# Patient Record
Sex: Male | Born: 1980 | Race: White | Hispanic: No | Marital: Married | Smoking: Never smoker
Health system: Southern US, Community
[De-identification: ages and names within clinical notes are randomized; demographics above are authoritative.]

## PROBLEM LIST (undated history)

## (undated) ENCOUNTER — Ambulatory Visit (HOSPITAL_BASED_OUTPATIENT_CLINIC_OR_DEPARTMENT_OTHER): Admission: RE | Payer: No Typology Code available for payment source | Admitting: Neurological Surgery

## (undated) ENCOUNTER — Ambulatory Visit (HOSPITAL_BASED_OUTPATIENT_CLINIC_OR_DEPARTMENT_OTHER): Payer: No Typology Code available for payment source | Admitting: Pain Medicine

## (undated) ENCOUNTER — Ambulatory Visit (HOSPITAL_BASED_OUTPATIENT_CLINIC_OR_DEPARTMENT_OTHER): Payer: Commercial Managed Care - HMO | Admitting: Pain Medicine

## (undated) ENCOUNTER — Encounter (HOSPITAL_BASED_OUTPATIENT_CLINIC_OR_DEPARTMENT_OTHER): Payer: Self-pay

## (undated) ENCOUNTER — Inpatient Hospital Stay (HOSPITAL_COMMUNITY): Payer: Self-pay | Admitting: Neurological Surgery

## (undated) ENCOUNTER — Encounter (HOSPITAL_BASED_OUTPATIENT_CLINIC_OR_DEPARTMENT_OTHER): Admission: RE | Payer: Self-pay

## (undated) ENCOUNTER — Encounter (HOSPITAL_COMMUNITY): Payer: Self-pay

## (undated) ENCOUNTER — Ambulatory Visit (HOSPITAL_BASED_OUTPATIENT_CLINIC_OR_DEPARTMENT_OTHER): Payer: Self-pay | Admitting: Neurological Surgery

## (undated) DIAGNOSIS — G9389 Other specified disorders of brain: Secondary | ICD-10-CM

## (undated) DIAGNOSIS — L709 Acne, unspecified: Secondary | ICD-10-CM

## (undated) DIAGNOSIS — R61 Generalized hyperhidrosis: Secondary | ICD-10-CM

## (undated) DIAGNOSIS — E785 Hyperlipidemia, unspecified: Secondary | ICD-10-CM

## (undated) DIAGNOSIS — G4759 Other parasomnia: Secondary | ICD-10-CM

## (undated) DIAGNOSIS — M549 Dorsalgia, unspecified: Secondary | ICD-10-CM

## (undated) HISTORY — DX: Generalized hyperhidrosis: R61

## (undated) HISTORY — DX: Other parasomnia: G47.59

## (undated) HISTORY — DX: Acne, unspecified: L70.9

## (undated) HISTORY — DX: Dorsalgia, unspecified: M54.9

## (undated) HISTORY — PX: OTHER PROCEDURE: U1053

## (undated) HISTORY — DX: Hyperlipidemia, unspecified: E78.5

## (undated) HISTORY — PX: OTHER SURGICAL HISTORY: SHX169

## (undated) HISTORY — PX: LAMINECTOMY: SHX219

## (undated) HISTORY — PX: LUMBAR MICRODISCECTOMY: SHX99

## (undated) HISTORY — PX: SPINE SURGERY: SHX786

## (undated) SURGERY — IR LUMBAR PUNCTURE DIAGNOSTIC OR THERAPUTIC
Anesthesia: Local

## (undated) SURGERY — PAIN ESI LUMBAR WITH IMAGING
Anesthesia: Local | Laterality: Bilateral

## (undated) SURGERY — CORPECTOMY, SPINE, CERVICAL ANTERIOR
Anesthesia: General

## (undated) SURGERY — IR MYELOGRAM SPINE LUMBOSACRAL
Anesthesia: General

## (undated) SURGERY — PAIN TPI (2 OR MORE MUSCLES)

## (undated) SURGERY — PAIN SYMP NERVE BLOCK, GANGLION SPHENOPALATINE

## (undated) SURGERY — PAIN TPI (2 OR MORE MUSCLES)
Laterality: Bilateral

## (undated) MED ORDER — TRIAMCINOLONE ACETONIDE 40 MG/ML IJ SUSP
4.00 mg | Freq: Once | INTRAMUSCULAR | Status: AC
Start: 2016-08-27 — End: 2016-08-27

## (undated) MED ORDER — IOHEXOL 240 MG/ML IJ SOLN
1.00 mL | Freq: Once | INTRAMUSCULAR | Status: AC
Start: 2017-12-26 — End: 2017-12-26

## (undated) MED ORDER — BUPIVACAINE HCL (PF) 0.25 % IJ SOLN
1.00 mL | Freq: Once | INTRAMUSCULAR | Status: AC
Start: 2018-03-05 — End: 2018-03-05

## (undated) MED ORDER — DEXAMETHASONE SOD PHOSPHATE PF 10 MG/ML IJ SOLN
1.00 mg | Freq: Once | INTRAMUSCULAR | Status: AC
Start: 2017-08-23 — End: 2017-08-23

## (undated) MED ORDER — METOCLOPRAMIDE HCL 5 MG/ML IJ SOLN
10.00 mg | INTRAMUSCULAR | Status: AC | PRN
Start: 2018-01-17 — End: 2018-01-17

## (undated) MED ORDER — IOHEXOL 240 MG/ML IJ SOLN
1.00 mL | Freq: Once | INTRAMUSCULAR | Status: AC
Start: 2017-08-23 — End: 2017-08-23

## (undated) MED ORDER — ONDANSETRON HCL 4 MG/2ML IV SOLN
4.00 mg | INTRAMUSCULAR | Status: AC | PRN
Start: 2018-01-17 — End: 2018-01-17

## (undated) MED ORDER — IOHEXOL 240 MG/ML IJ SOLN
1.00 mL | Freq: Once | INTRAMUSCULAR | Status: AC
Start: 2018-05-06 — End: 2018-05-06

## (undated) MED ORDER — HEPARIN SODIUM LOCK FLUSH 100 UNIT/ML IJ SOLN
500.00 [IU] | Freq: Once | INTRAVENOUS | Status: AC | PRN
Start: 2018-01-17 — End: ?

## (undated) MED ORDER — BUPIVACAINE HCL (PF) 0.5 % IJ SOLN
1.00 mL | Freq: Once | INTRAMUSCULAR | Status: AC
Start: 2017-10-01 — End: 2017-10-01

## (undated) MED ORDER — BUPIVACAINE HCL (PF) 0.25 % IJ SOLN
1.00 mL | Freq: Once | INTRAMUSCULAR | Status: AC
Start: 2016-08-27 — End: 2016-08-27

## (undated) MED ORDER — DEXAMETHASONE SOD PHOSPHATE PF 10 MG/ML IJ SOLN
1.00 mg | Freq: Once | INTRAMUSCULAR | Status: AC
Start: 2018-05-06 — End: 2018-05-06

## (undated) MED ORDER — TRIAMCINOLONE ACETONIDE 40 MG/ML IJ SUSP
4.00 mg | Freq: Once | INTRAMUSCULAR | Status: AC
Start: 2018-03-05 — End: 2018-03-05

## (undated) MED ORDER — CLINDAMYCIN-BENZOYL PER (REFR) 1-5 % EX GEL
1.00 | Freq: Every day | CUTANEOUS | 2 refills | Status: AC
Start: 2016-02-07 — End: ?

## (undated) MED ORDER — BUPIVACAINE HCL (PF) 0.5 % IJ SOLN
1.00 mL | Freq: Once | INTRAMUSCULAR | Status: AC
Start: 2017-08-23 — End: 2017-08-23

## (undated) MED ORDER — CLONAZEPAM 0.5 MG OR TABS
0.25 mg | ORAL_TABLET | Freq: Every evening | ORAL | 0 refills | Status: AC
Start: 2018-03-21 — End: ?

## (undated) MED ORDER — DEXAMETHASONE SOD PHOSPHATE PF 10 MG/ML IJ SOLN
1.00 mg | Freq: Once | INTRAMUSCULAR | Status: AC
Start: 2017-12-26 — End: 2017-12-26

## (undated) MED ORDER — IOHEXOL 240 MG/ML IJ SOLN
1.00 mL | Freq: Once | INTRAMUSCULAR | Status: AC
Start: 2016-08-27 — End: 2016-08-27

---

## 2010-08-24 ENCOUNTER — Encounter: Payer: Self-pay | Admitting: Family Medicine

## 2010-09-05 ENCOUNTER — Encounter: Payer: Self-pay | Admitting: Family Medicine

## 2010-09-05 ENCOUNTER — Ambulatory Visit (INDEPENDENT_AMBULATORY_CARE_PROVIDER_SITE_OTHER): Payer: PRIVATE HEALTH INSURANCE | Admitting: Family Medicine

## 2010-09-05 VITALS — BP 114/73 | Ht 68.0 in | Wt 165.0 lb

## 2010-09-05 DIAGNOSIS — M722 Plantar fascial fibromatosis: Secondary | ICD-10-CM

## 2010-09-06 DIAGNOSIS — M722 Plantar fascial fibromatosis: Secondary | ICD-10-CM | POA: Insufficient documentation

## 2010-09-06 NOTE — Progress Notes (Signed)
  Subjective:    Patient ID: Gary Arnold, male    DOB: 1980/05/11, 30 y.o.   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 Nedra Hai at [facility] for custom orthotics.  Patient with hx of increasing left-sided heel pain x 1-2 month.  Pain worse in the morning & after prolonged sitting, but does improve with activity.  Has been doing HEP & icing as directed by Dr. Nedra Hai which has been helpful.  He is active - jogging ~10-15 miles/wk & playing basketball about 1-2 times/wk.  He has old set of ridged 3/4-length orthotics that are >60 years old, they were initially helpful, but are becoming more uncomfortable & worn out.   Review of Systems Per HPI, otherwise negative    Objective:   Physical Exam GEN: AOx3, NAD, pleasant MSK: - Ankles:  FROM without pain, no swelling or effusion.  No instability.  Normal strength - Feet: bilateral pes planus with over-pronation while standing.  TTP along insertion of left PF on calcaneous, no TTP along PF on right.  Good post-tib function - Gait: no leg length difference.  Increased dynamic over-pronation with running & walking, otherwise good running form. Neurovascularly intact distally.      Assessment & Plan:

## 2010-09-06 NOTE — Assessment & Plan Note (Signed)
Left plantar fasciitis with associated over-pronation & pes planus. - Has done well with custom orthotic in the past, although these were rigid & made >8 years ago and no longer helping - Since did well with previous orthotics patient is excellent candidate for semi-rigid orthotics which were made for him today. Patient was fitted for a : standard, cushioned, semi-rigid orthotic. The orthotic was heated, placed on the orthotic stand. The patient was positioned in subtalar neutral position and 10 degrees of ankle dorsiflexion in a weight bearing stance on the heated orthotic blank After completion of molding, a stable base was applied to the orthotic blank. The blank was ground to a stable position for weight bearing. Size: 10 Base: Fasttech black stripe Posting: blue eva foam Additional posting: none Orthotics comfortable to patient in office & over-pronation was corrected  - Continue home exercises - Follow-up as needed

## 2011-01-08 ENCOUNTER — Emergency Department (HOSPITAL_COMMUNITY): Payer: PRIVATE HEALTH INSURANCE

## 2011-01-08 ENCOUNTER — Emergency Department (HOSPITAL_COMMUNITY)
Admission: EM | Admit: 2011-01-08 | Discharge: 2011-01-08 | Disposition: A | Discharge status: Home / Self Care | Payer: PRIVATE HEALTH INSURANCE | Attending: Emergency Medicine | Admitting: Emergency Medicine

## 2011-01-08 DIAGNOSIS — R51 Headache: Secondary | ICD-10-CM | POA: Insufficient documentation

## 2011-01-08 DIAGNOSIS — H9209 Otalgia, unspecified ear: Secondary | ICD-10-CM | POA: Insufficient documentation

## 2011-01-08 DIAGNOSIS — R42 Dizziness and giddiness: Secondary | ICD-10-CM | POA: Insufficient documentation

## 2011-01-08 DIAGNOSIS — R112 Nausea with vomiting, unspecified: Secondary | ICD-10-CM | POA: Insufficient documentation

## 2011-01-24 ENCOUNTER — Other Ambulatory Visit: Payer: Self-pay | Admitting: Family Medicine

## 2011-01-24 DIAGNOSIS — R51 Headache: Secondary | ICD-10-CM

## 2011-01-24 DIAGNOSIS — M542 Cervicalgia: Secondary | ICD-10-CM

## 2011-01-25 ENCOUNTER — Ambulatory Visit
Admission: RE | Admit: 2011-01-25 | Discharge: 2011-01-25 | Disposition: A | Discharge status: Home / Self Care | Payer: PRIVATE HEALTH INSURANCE | Source: Ambulatory Visit | Attending: Family Medicine | Admitting: Family Medicine

## 2011-01-25 DIAGNOSIS — M542 Cervicalgia: Secondary | ICD-10-CM

## 2011-01-25 DIAGNOSIS — R51 Headache: Secondary | ICD-10-CM

## 2011-02-09 ENCOUNTER — Other Ambulatory Visit: Payer: Self-pay | Admitting: Neurology

## 2011-02-09 DIAGNOSIS — R519 Headache, unspecified: Secondary | ICD-10-CM

## 2011-02-13 ENCOUNTER — Ambulatory Visit
Admission: RE | Admit: 2011-02-13 | Discharge: 2011-02-13 | Disposition: A | Discharge status: Home / Self Care | Payer: PRIVATE HEALTH INSURANCE | Source: Ambulatory Visit | Attending: Neurology | Admitting: Neurology

## 2011-02-13 DIAGNOSIS — R519 Headache, unspecified: Secondary | ICD-10-CM

## 2011-02-13 MED ORDER — GADOBENATE DIMEGLUMINE 529 MG/ML IV SOLN
15.0000 mL | Freq: Once | INTRAVENOUS | Status: AC | PRN
  Administered 2011-02-13: 15 mL via INTRAVENOUS

## 2011-04-04 ENCOUNTER — Other Ambulatory Visit: Payer: Self-pay | Admitting: Specialist

## 2011-04-04 DIAGNOSIS — R51 Headache: Secondary | ICD-10-CM

## 2011-04-18 ENCOUNTER — Ambulatory Visit
Admission: RE | Admit: 2011-04-18 | Discharge: 2011-04-18 | Disposition: A | Discharge status: Home / Self Care | Payer: PRIVATE HEALTH INSURANCE | Source: Ambulatory Visit | Attending: Specialist | Admitting: Specialist

## 2011-04-18 DIAGNOSIS — R51 Headache: Secondary | ICD-10-CM

## 2012-04-16 HISTORY — PX: OTHER PROCEDURE: U1053

## 2013-04-13 ENCOUNTER — Encounter (HOSPITAL_COMMUNITY): Payer: Self-pay | Admitting: Emergency Medicine

## 2013-04-13 ENCOUNTER — Emergency Department (HOSPITAL_COMMUNITY)
Admission: EM | Admit: 2013-04-13 | Discharge: 2013-04-13 | Disposition: A | Discharge status: Home / Self Care | Payer: BC Managed Care – PPO | Attending: Emergency Medicine | Admitting: Emergency Medicine

## 2013-04-13 DIAGNOSIS — R599 Enlarged lymph nodes, unspecified: Secondary | ICD-10-CM | POA: Insufficient documentation

## 2013-04-13 DIAGNOSIS — Z79899 Other long term (current) drug therapy: Secondary | ICD-10-CM | POA: Insufficient documentation

## 2013-04-13 DIAGNOSIS — H6011 Cellulitis of right external ear: Secondary | ICD-10-CM

## 2013-04-13 DIAGNOSIS — H60399 Other infective otitis externa, unspecified ear: Secondary | ICD-10-CM | POA: Insufficient documentation

## 2013-04-13 MED ORDER — CEFTRIAXONE SODIUM 1 G IJ SOLR
1.0000 g | Freq: Once | INTRAMUSCULAR | Status: AC
  Administered 2013-04-13: 1 g via INTRAMUSCULAR
  Filled 2013-04-13: qty 10

## 2013-04-13 MED ORDER — CEPHALEXIN 500 MG PO CAPS: 500.0000 mg | ORAL_CAPSULE | Freq: Four times a day (QID) | ORAL | Status: DC

## 2013-04-13 NOTE — ED Notes (Signed)
Pt presents with right ear tenderness and right sided facial swelling. Pt says his ear now feels clogged and is very tender to the touch.

## 2013-04-13 NOTE — ED Notes (Signed)
Pt c/o right sided facial swelling; progressive x 4 days; feels like right ear clogged

## 2013-04-13 NOTE — ED Provider Notes (Signed)
CSN: [id number]     Arrival date & time 04/13/13  1749 History  This chart was scribed for non-physician practitioner, Antony Madura, PA-C working with Nelia Shi, MD by Greggory Stallion, ED scribe. This patient was seen in room WTR6/WTR6 and the patient's care was started at 8:40 PM.   Chief Complaint  Patient presents with  . Facial Swelling   The history is provided by the patient. No language interpreter was used.   HPI Comments: Gary Arnold is a 32 y.o. male who presents to the Emergency Department complaining of worsening swelling to his right tragus with associated redness and soreness that started 4 days ago. Denies recent trauma or injury. He states it feels like his ear is clogged. He also endorses some associated lymph node swelling and soreness on the R side of his neck. Pt is currently taking doxycycline for acne. Denies dental pain, fever/chills, drainage from the ear, hearing changes, and neck stiffness.  History reviewed. No pertinent past medical history. Past Surgical History  Procedure Laterality Date  . Spine surgery     No family history on file. History  Substance Use Topics  . Smoking status: Never Smoker   . Smokeless tobacco: Not on file  . Alcohol Use: Not on file    Review of Systems  Constitutional: Negative for fever.  HENT: Positive for ear pain and facial swelling (right tragus). Negative for dental problem, ear discharge and trouble swallowing.   Skin: Positive for color change.  All other systems reviewed and are negative.    Allergies  Review of patient's allergies indicates no known allergies.  Home Medications   Current Outpatient Rx  Name  Route  Sig  Dispense  Refill  . Clindamycin Phos-Benzoyl Perox (ACANYA) gel   Topical   Apply 1 application topically daily.         Marland Kitchen doxycycline (DORYX) 100 MG DR capsule   Oral   Take 100 mg by mouth 2 (two) times daily.         . Multiple Vitamin (MULTIVITAMIN WITH MINERALS) TABS  tablet   Oral   Take 1 tablet by mouth daily.         . Omega-3 Fatty Acids (FISH OIL) 1000 MG CAPS   Oral   Take 1 capsule by mouth daily.         . cephALEXin (KEFLEX) 500 MG capsule   Oral   Take 1 capsule (500 mg total) by mouth 4 (four) times daily.   40 capsule   0     BP 121/83  Pulse 87  Temp(Src) 99 F (37.2 C) (Oral)  Resp 20  Ht 5\' 8"  (1.727 m)  Wt 165 lb (74.844 kg)  BMI 25.09 kg/m2  SpO2 100%  Physical Exam  Nursing note and vitals reviewed. Constitutional: He is oriented to person, place, and time. He appears well-developed and well-nourished. No distress.  HENT:  Head: Normocephalic and atraumatic.  Right Ear: Hearing and ear canal normal. There is tenderness. No drainage. Tympanic membrane is not perforated, not retracted and not bulging. No decreased hearing is noted.  Left Ear: Tympanic membrane, external ear and ear canal normal.  Swelling of his right tragus with erythema and heat to touch. Clear serosanguinous fluid behind right TM. R ear canal nonswollen and nonerythematous. No tenderness when pulling on R auricle. No swelling, erythema or heat of the R mastoid process. No discrete pus pocket or fluctuance identified.  Eyes: Conjunctivae and EOM are normal. No  scleral icterus.  Neck: Normal range of motion. No rigidity. No erythema and normal range of motion present.  + swelling and TTP of R anterior and poster cervical lymph nodes. No nuchal rigidity or meningismus.  Pulmonary/Chest: Effort normal. No stridor. No respiratory distress.  Musculoskeletal: Normal range of motion.  Lymphadenopathy:    He has cervical adenopathy.  Neurological: He is alert and oriented to person, place, and time.  Skin: Skin is warm and dry. No rash noted. He is not diaphoretic. No erythema. No pallor.  Psychiatric: He has a normal mood and affect. His behavior is normal.    ED Course  Procedures (including critical care time)  DIAGNOSTIC STUDIES: Oxygen  Saturation is 100% on RA, normal by my interpretation.    COORDINATION OF CARE: 8:48 PM-Discussed treatment plan which includes an antibiotic and ENT follow up with pt at bedside and pt agreed to plan. Advised pt to return to the ED if symptoms worsen.   Labs Review Labs Reviewed - No data to display Imaging Review No results found.  EKG Interpretation   None       MDM   1. Cellulitis of tragus of right ear    Uncomplicated cellulitis of the right tragus. Patient well and nontoxic appearing, hemodynamically stable, and afebrile. No red linear streaking appreciated. No discrete pus pocket or area of fluctuance. Hearing grossly intact bilaterally. Right ear canal unremarkable. No erythema, swelling, or tenderness to touch of the right mastoid process. No nuchal rigidity or meningismus. No stridor. Patient treated in ED with 1 g IM Rocephin. He will be discharged on course of 500 mg Keflex 4 times a day and instruction to followup with an ear nose and throat doctor. Strict return precautions provided and patient agreeable to plan with no unaddressed concerns.  I personally performed the services described in this documentation, which was scribed in my presence. The recorded information has been reviewed and is accurate.   Filed Vitals:   04/13/13 1921  BP: 121/83  Pulse: 87  Temp: 99 F (37.2 C)  TempSrc: Oral  Resp: 20  Height: 5\' 8"  (1.727 m)  Weight: 165 lb (74.844 kg)  SpO2: 100%     Antony Madura, PA-C 04/13/13 2123

## 2013-04-16 ENCOUNTER — Emergency Department (HOSPITAL_COMMUNITY)
Admission: EM | Admit: 2013-04-16 | Discharge: 2013-04-16 | Disposition: A | Discharge status: Home / Self Care | Payer: BC Managed Care – PPO | Attending: Emergency Medicine | Admitting: Emergency Medicine

## 2013-04-16 ENCOUNTER — Encounter (HOSPITAL_COMMUNITY): Payer: Self-pay | Admitting: Emergency Medicine

## 2013-04-16 DIAGNOSIS — Z79899 Other long term (current) drug therapy: Secondary | ICD-10-CM | POA: Insufficient documentation

## 2013-04-16 DIAGNOSIS — Z792 Long term (current) use of antibiotics: Secondary | ICD-10-CM | POA: Insufficient documentation

## 2013-04-16 DIAGNOSIS — H60399 Other infective otitis externa, unspecified ear: Secondary | ICD-10-CM | POA: Insufficient documentation

## 2013-04-16 DIAGNOSIS — R221 Localized swelling, mass and lump, neck: Secondary | ICD-10-CM

## 2013-04-16 DIAGNOSIS — H6011 Cellulitis of right external ear: Secondary | ICD-10-CM

## 2013-04-16 DIAGNOSIS — R220 Localized swelling, mass and lump, head: Secondary | ICD-10-CM | POA: Insufficient documentation

## 2013-04-16 HISTORY — DX: Other specified disorders of brain: G93.89

## 2013-04-16 MED ORDER — CLINDAMYCIN HCL 300 MG PO CAPS: 300.0000 mg | ORAL_CAPSULE | Freq: Four times a day (QID) | ORAL | Status: DC

## 2013-04-16 MED ORDER — HYDROCODONE-ACETAMINOPHEN 5-325 MG PO TABS: 1.0000 | ORAL_TABLET | ORAL | Status: DC | PRN

## 2013-04-16 NOTE — Discharge Instructions (Signed)
Read the information below.  Use the prescribed medication as directed.  Please discuss all new medications with your pharmacist.  Do not take additional tylenol while taking the prescribed pain medication to avoid overdose.  You may return to the Emergency Department at any time for worsening condition or any new symptoms that concern you.  If you develop increased redness, swelling, pain inside your ear, or fevers greater than 100.4, see the ENT immediately if possible or return to the ER for a recheck.     Cellulitis Cellulitis is an infection of the skin and the tissue beneath it. The infected area is usually red and tender. Cellulitis occurs most often in the arms and lower legs.  CAUSES  Cellulitis is caused by bacteria that enter the skin through cracks or cuts in the skin. The most common types of bacteria that cause cellulitis are Staphylococcus and Streptococcus. SYMPTOMS   Redness and warmth.  Swelling.  Tenderness or pain.  Fever. DIAGNOSIS  Your caregiver can usually determine what is wrong based on a physical exam. Blood tests may also be done. TREATMENT  Treatment usually involves taking an antibiotic medicine. HOME CARE INSTRUCTIONS   Take your antibiotics as directed. Finish them even if you start to feel better.  Keep the infected arm or leg elevated to reduce swelling.  Apply a warm cloth to the affected area up to 4 times per day to relieve pain.  Only take over-the-counter or prescription medicines for pain, discomfort, or fever as directed by your caregiver.  Keep all follow-up appointments as directed by your caregiver. SEEK MEDICAL CARE IF:   You notice red streaks coming from the infected area.  Your red area gets larger or turns dark in color.  Your bone or joint underneath the infected area becomes painful after the skin has healed.  Your infection returns in the same area or another area.  You notice a swollen bump in the infected area.  You  develop new symptoms. SEEK IMMEDIATE MEDICAL CARE IF:   You have a fever.  You feel very sleepy.  You develop vomiting or diarrhea.  You have a general ill feeling (malaise) with muscle aches and pains. MAKE SURE YOU:   Understand these instructions.  Will watch your condition.  Will get help right away if you are not doing well or get worse. Document Released: 01/10/2005 Document Revised: 10/02/2011 Document Reviewed: 06/18/2011 [facility] Patient Information 2014 [employer], [place].

## 2013-04-16 NOTE — ED Notes (Signed)
Pt states on 12/22 or 12/23, he noticed swelling in his right face and reddening of the right tragus.  Pt states he was here on 12/29 and dx with cellulitis of tragus.  Pt states today he noticed that a black spot has formed in the center of the redness and swelling.  Pt denies drainage from area.  Pt denies N/V/D and fever.  Pt does have swelling to right face and right tragus.

## 2013-04-16 NOTE — ED Notes (Signed)
Pt was seen last week for an abcess forming to his rt anterior ear. Pt was given rocephin and keflex. Seen dermotology on the 29th and they done  a culture with bno relief. The area is causing increase pain with some blacken area to tip and some hearing diff.

## 2013-04-16 NOTE — ED Provider Notes (Signed)
CSN: [id number]     Arrival date & time 04/16/13  1327 History   First MD Initiated Contact with Patient 04/16/13 1353     Chief Complaint  Patient presents with  . Abscess    right tragus   (Consider location/radiation/quality/duration/timing/severity/associated sxs/prior Treatment) The history is provided by the patient.   Patient reports he has had pain and swelling over his right outer ear (indicated tragus) with associated right facial swelling for the past 8-9 days.  He was seen in the ED on 12/29 and was given keflex, was seen by derm yesterday and started bactroban.  States the swelling over his right face has improved but the area over the tragus has become a deeper red and has developed a black scab over it.  He denies fevers, chills, myalgias, vomiting, but notes that he does have decreased energy over the past 4-5 days.  Denies drainage from the area.  Denies inner ear pain.   Past Medical History  Diagnosis Date  . 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 Spine surgery    . Laminectomy    . Lumbar microdiscectomy    . Duramater patching     No family history on file. History  Substance Use Topics  . Smoking status: Never Smoker   . Smokeless tobacco: Never Used  . Alcohol Use: Yes     Comment: occasional    Review of Systems  Constitutional: Negative for fever and chills.  HENT: Positive for ear pain and facial swelling. Negative for ear discharge and hearing loss.   Gastrointestinal: Negative for vomiting.  Skin: Positive for color change and wound.    Allergies  Review of patient's allergies indicates no known allergies.  Home Medications   Current Outpatient Rx  Name  Route  Sig  Dispense  Refill  . cephALEXin (KEFLEX) 500 MG capsule   Oral   Take 1 capsule (500 mg total) by mouth 4 (four) times daily.   40 capsule   0   . Clindamycin Phos-Benzoyl Perox  (ACANYA) gel   Topical   Apply 1 application topically daily.         Marland Kitchen doxycycline (DORYX) 100 MG DR capsule   Oral   Take 100 mg by mouth 2 (two) times daily.         Marland Kitchen ibuprofen (ADVIL,MOTRIN) 200 MG tablet   Oral   Take 200-400 mg by mouth every 6 (six) hours as needed for mild pain.         . Multiple Vitamin (MULTIVITAMIN WITH MINERALS) TABS tablet   Oral   Take 1 tablet by mouth daily.         . mupirocin ointment (BACTROBAN) 2 %   Nasal   Place 1 application into the nose 3 (three) times daily.         . Omega-3 Fatty Acids (FISH OIL) 1000 MG CAPS   Oral   Take 1 capsule by mouth daily.          BP 132/78  Pulse 77  Temp(Src) 98 F (36.7 C) (Oral)  Resp 20  SpO2 99% Physical Exam  Nursing note and vitals reviewed. Constitutional: He appears well-developed and well-nourished. No distress.  HENT:  Head: Normocephalic and atraumatic.  Ears:  Right face with mild swelling compared to left, just anterior to right ear.  No erythema, warmth, or tenderness in this  area.   Eyes: Conjunctivae and EOM are normal.  Neck: Normal range of motion. Neck supple.  Pulmonary/Chest: Effort normal.  Neurological: He is alert.  Skin: He is not diaphoretic.    ED Course  Procedures (including critical care time) Labs Review Labs Reviewed - No data to display Imaging Review No results found.  EKG Interpretation   None      INCISION AND DRAINAGE Performed by: Trixie DredgeWEST, Davyd Podgorski Consent: Verbal consent obtained. Risks and benefits: risks, benefits and alternatives were discussed Type: abscess  Body area: right tragus  Anesthesia: local infiltration  Incision was made with a scalpel.  Local anesthetic: lidocaine 2% no epinephrine  Anesthetic total: 1 ml  Complexity: simple   Drainage: bloody Drainage amount: small  Packing material: none  Patient tolerance: Patient tolerated the procedure well with no immediate complications.     3:09 PM I&D  performed by me without any purulent drainage.  Discussed with Dr Adriana Simasook who also saw and examined the patient, and also attempted I&D without drainage.  Per discussion with Dr Adriana Simasook and patient, will change abx to Clindamycin and speak to ENT to attempt to arrange close follow up.   MDM   1. Cellulitis of right ear     Pt with right facial/tragus cellulitis with what appeared to be an abscess but did not drain any purulent material with I&D.  Cellulitis appears to track into the anterior external ear canal as there is some anterior edema and tenderness but no erythema.  Pt has been on keflex x 4 days, mupirocen x 1 day.  Will switch patient to clindamycin.  I have spoken with Dr Jenne PaneBates, ENT, who agrees with plan and also agrees to see patient in follow up in his office.  Patient and significant other updated on plan, answered all questions.  D/C home.  Discussed findings, treatment, and follow up  with patient.  Pt given return precautions.  Pt verbalizes understanding and agrees with plan.        MontereyEmily Jerel Sardina, PA-C 04/16/13 1607

## 2013-04-16 NOTE — ED Provider Notes (Signed)
Medical screening examination/treatment/procedure(s) were performed by non-physician practitioner and as supervising physician I was immediately available for consultation/collaboration.   Daimon Kean L Fread Kottke, MD 04/16/13 1055 

## 2013-04-17 NOTE — ED Provider Notes (Signed)
Medical screening examination/treatment/procedure(s) were conducted as a shared visit with non-physician practitioner(s) and myself.  I personally evaluated the patient during the encounter.  EKG Interpretation   None      No pus pocket obtained status post incision and drainage. Will switch to clindamycin. Referral to otolaryngology  Donnetta HutchingBrian Sunshine Mackowski, MD 04/17/13 [phone]

## 2013-11-02 ENCOUNTER — Encounter: Payer: Self-pay | Admitting: Sports Medicine

## 2013-11-02 ENCOUNTER — Ambulatory Visit (INDEPENDENT_AMBULATORY_CARE_PROVIDER_SITE_OTHER): Payer: BC Managed Care – PPO | Admitting: Sports Medicine

## 2013-11-02 VITALS — BP 119/78 | HR 80 | Ht 68.0 in | Wt 172.0 lb

## 2013-11-02 DIAGNOSIS — M2141 Flat foot [pes planus] (acquired), right foot: Secondary | ICD-10-CM

## 2013-11-02 DIAGNOSIS — M2142 Flat foot [pes planus] (acquired), left foot: Secondary | ICD-10-CM

## 2013-11-02 DIAGNOSIS — M2140 Flat foot [pes planus] (acquired), unspecified foot: Secondary | ICD-10-CM

## 2013-11-02 NOTE — Progress Notes (Signed)
Patient ID: Gary Arnold, male   DOB: 08/13/1980, 33 y.o.   MRN: [id number]  Patient presents today requesting new custom orthotics. He has a history of plantar fasciitis and custom orthotics were constructed back in 2012. They have been very helpful but have begun to wear out. He denies any foot or knee pain currently.  Patient was fitted for a : standard, cushioned, semi-rigid orthotic. The orthotic was heated and afterward the patient stood on the orthotic blank positioned on the orthotic stand. The patient was positioned in subtalar neutral position and 10 degrees of ankle dorsiflexion in a weight bearing stance. After completion of molding, a stable base was applied to the orthotic blank. The blank was ground to a stable position for weight bearing. Size:10 Fasttech black stripe Base: blue EVA Posting: none Additional orthotic padding: none  Patient found orthotics to be comfortable prior to leaving the office. Total of 30 minutes was spent with the patient with greater than 50% of the time spent in face to face consultation, orthotic construction, and fitting.

## 2013-12-10 ENCOUNTER — Encounter: Payer: Self-pay | Admitting: *Deleted

## 2013-12-22 ENCOUNTER — Telehealth: Payer: Self-pay | Admitting: Cardiology

## 2013-12-22 NOTE — Telephone Encounter (Signed)
Returned call to patient he stated he will be bringing copy of lab work that was done at PCP.Advised he can come fasting to appointment with Dr.Hochrein on 12/25/13 in case Dr.Hochrein wants to have lab work done.

## 2013-12-22 NOTE — Telephone Encounter (Signed)
Pt will be a new pt for Dr La Feria Lions on Friday. He wants to know if he needs lab work before he see Dr Oconto Lions?

## 2013-12-25 ENCOUNTER — Ambulatory Visit (INDEPENDENT_AMBULATORY_CARE_PROVIDER_SITE_OTHER): Payer: BC Managed Care – PPO | Admitting: Cardiology

## 2013-12-25 ENCOUNTER — Encounter: Payer: Self-pay | Admitting: Cardiology

## 2013-12-25 VITALS — BP 115/75 | HR 63 | Ht 68.0 in | Wt 167.9 lb

## 2013-12-25 DIAGNOSIS — E785 Hyperlipidemia, unspecified: Secondary | ICD-10-CM

## 2013-12-25 DIAGNOSIS — E782 Mixed hyperlipidemia: Secondary | ICD-10-CM

## 2013-12-25 DIAGNOSIS — R079 Chest pain, unspecified: Secondary | ICD-10-CM

## 2013-12-25 MED ORDER — ATORVASTATIN CALCIUM 40 MG PO TABS: 40.0000 mg | ORAL_TABLET | Freq: Every day | ORAL | Status: DC

## 2013-12-25 NOTE — Patient Instructions (Signed)
Your physician recommends that you schedule a follow-up appointment in:  2 months with Dr . Antoine Poche  We are ordering a Lipid panel  We have ordered Atorvastastin 40 mg

## 2013-12-25 NOTE — Progress Notes (Signed)
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 (testing done in July 2015). Fasting 278 total and 453 triglyceride HDL 36. (10/30/13). Non fasting 310, triglyceride 260, HDL 42 LDL 216.   Was on acutane in high school and was told at that time he had high cholesterol, was never placed on medication. Family hx of hyperlipidemia. Mother made dietary modification but now is considering statin. Father also on statin. Maternal cousins also on statin. 2nd cousin with heart attack at age 57. Just recently finished PT. Exercises 5-6 days a week (had 3 years of injury recovery somewhat limiting exertion). Now at least exercises x60 min per day (with 1/2 cardio alone). Since July has changed diet somewhat. Common foods include vegetables, fruits, chicken, red meat, fish, eggs nuts.  24-hr recall (UP at  7 AM) B ( AM)- protein shake (kale, spinach, strawberry, coco powder, whey protein, nuts)  Snk ( AM)- nuts (handful- half a cup)  L ( PM)- same as breakfast or [place] and chicken sandwich, vegetable Snk ( PM)- carrots and hummus  D ( PM)- chicken or fish with potatoes   This is a usual day for him. Alcohol once every three days 1-2 glasses of wine or up to 4 beers at one time; once every 3 weeks 5-6 beers at nighttime vs mixed drinks 1-2  No Known Allergies  Current Outpatient Prescriptions  Medication Sig Dispense Refill  . aluminum chloride (DRYSOL) 20 % external solution Apply topically once a week.      . cetirizine (ZYRTEC) 10 MG tablet Take 10 mg by mouth as needed for allergies.      . Clindamycin Phos-Benzoyl Perox (ACANYA) gel Apply 1 application topically as needed.       . clonazePAM (KLONOPIN) 1 MG tablet Take 1 tablet by mouth at bedtime.      Marland Kitchen doxycycline (DORYX) 100 MG DR capsule Take  100 mg by mouth as needed.       . fluticasone (FLONASE) 50 MCG/ACT nasal spray Place into both nostrils as needed for allergies or rhinitis.      Marland Kitchen ibuprofen (ADVIL,MOTRIN) 200 MG tablet Take 200-400 mg by mouth every 6 (six) hours as needed for mild pain.      . minoxidil (ROGAINE) 2 % external solution Apply topically 2 (two) times daily.      . Multiple Vitamin (MULTIVITAMIN WITH MINERALS) TABS tablet Take 1 tablet by mouth once a week.       . Omega-3 Fatty Acids (FISH OIL) 1000 MG CAPS Take 1 capsule by mouth daily.      . propranolol (INDERAL) 20 MG tablet        No current facility-administered medications for this visit.    Past Medical History  Diagnosis Date  . 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 Spine surgery    . Laminectomy    . Lumbar microdiscectomy    . Duramater patching      No family history on file. Brother- osteosarcoma Mother- cervical cancer Grandfather (paternal)- lung cancer Grandmother (paternal)- cancer Grandmother (maternal)- ovarian   History   Social History  . Marital Status: Married    Spouse Name: N/A  Number of Children: N/A  . Years of Education: N/A   Occupational History  . Not on file.   Social History Main Topics  . Smoking status: Never Smoker   . Smokeless tobacco: Never Used  . Alcohol Use: Yes     Comment: occasional  . Drug Use: No  . Sexual Activity: Not on file   Other Topics Concern  . Not on file   Social History Narrative  . No narrative on file    ROS: Review of Systems - General ROS: positive for  - 8 lb weight change in the past month Post wears glasses Last dental exam 6 months ago Card: sternal spasms Gastro: reflux like symptoms (had flex sig 2001) GU: prostate exam 1 month MSK: had cervical spine injury  PHYSICAL EXAM BP 115/75  Pulse 63  Ht  (1.727 m)  Wt 167 lb 14.4 oz (76.159 kg)  BMI  25.54 kg/m2 GENERAL:  Well appearing HEENT:  Pupils equal round and reactive, fundi not visualized, oral mucosa unremarkable NECK:  No jugular venous distention, waveform within normal limits, carotid upstroke brisk and symmetric, no bruits, no thyromegaly LYMPHATICS:  No cervical, inguinal adenopathy LUNGS:  Clear to auscultation bilaterally BACK:  No CVA tenderness CHEST:  Unremarkable HEART:  PMI not displaced or sustained,S1 and S2 within normal limits, no S3, no S4, no clicks, no rubs, no murmurs ABD:  Flat, positive bowel sounds normal in frequency in pitch, no bruits, no rebound, no guarding, no midline pulsatile mass, no hepatomegaly, no splenomegaly EXT:  2 plus pulses throughout, no edema, no cyanosis no clubbing SKIN:  No rashes no nodules NEURO:  Cranial nerves II through XII grossly intact, motor grossly intact throughout PSYCH:  Cognitively intact, oriented to person place and time   EKG: HR 63; nml axis, NSR  ASSESSMENT AND PLAN  1. Hyperlipidemia - likely related to familial condition (esp given above hx on maternal side); is appropriately exercising with 30 min cardiovasc X 5 days with strength training; minimal junk food in diet and low quantity alcohol -start on ator - repeat lipids in 2 months fasting -coronary exam  -dietary and weight modification as needed 2. Health Maintenance - no signs of DM, HTN, follows regularly with PCP -discussed importance of mediterranean diet   History and all data above reviewed.  Patient examined.  I agree with the findings as above.  The patient has no prior cardiac history.  However, he does Has significant dyslipidemia despite healthy lifestyle with diet and reasonable exercise regimen. The patient exam reveals COR:RRR, no murmur  ,  Lungs: Cleare  ,  Abd: Positive bowel sounds, no rebound no guarding, Ext No edema  .  All available labs, radiology testing, previous records reviewed. Agree with documented assessment and plan.  Dyslipidemia: We spent  quite a bit of time talking about primary lipid management. Given his LDL greater than 200 despite significant therapeutic lifestyle changes he would have indication for primary prevention statin. He discussed the risk benefits of this in detail. I will start him on Lipitor 40 mg daily.  I will also check a coronary calcium score to further guide his therapy.  Fayrene Fearing Hochrein  11:54 AM  12/25/2013

## 2013-12-28 ENCOUNTER — Other Ambulatory Visit: Payer: Self-pay | Admitting: *Deleted

## 2013-12-28 NOTE — Addendum Note (Signed)
Addended by: Vincenza Hews. on: 12/28/2013 03:22 PM   Modules accepted: Orders

## 2013-12-30 ENCOUNTER — Ambulatory Visit (INDEPENDENT_AMBULATORY_CARE_PROVIDER_SITE_OTHER)
Admission: RE | Admit: 2013-12-30 | Discharge: 2013-12-30 | Disposition: A | Discharge status: Home / Self Care | Payer: Self-pay | Source: Ambulatory Visit | Attending: Cardiology | Admitting: Cardiology

## 2013-12-30 DIAGNOSIS — R079 Chest pain, unspecified: Secondary | ICD-10-CM

## 2013-12-30 DIAGNOSIS — E785 Hyperlipidemia, unspecified: Secondary | ICD-10-CM

## 2013-12-30 DIAGNOSIS — E782 Mixed hyperlipidemia: Secondary | ICD-10-CM

## 2014-01-11 ENCOUNTER — Telehealth: Payer: Self-pay | Admitting: Cardiology

## 2014-01-11 NOTE — Telephone Encounter (Signed)
New message          Are these symptoms from new medication Lipitor: dull constant pain / has not changed physical or dietary activity / ham string pain

## 2014-01-12 ENCOUNTER — Telehealth: Payer: Self-pay | Admitting: Cardiology

## 2014-01-12 NOTE — Telephone Encounter (Signed)
Pt. States has been having more pain in his coccyx region, pt. Informed to stop taking lipitor for 30 days tro see if the pain decreases and to make an appt. [facility]

## 2014-01-12 NOTE — Telephone Encounter (Signed)
Pt. Wants you to call him, I talked to him this morning and he said he was having pain in his lower back area and was thinking it was coming from the lipitor, so I told him to stop the lipitor for 30 days to see if there was improvement and he needed to contact his physcians at [facility] to further investigate his back pain since he has such a strong hx, he has now called back and wants to talk to you

## 2014-01-12 NOTE — Telephone Encounter (Signed)
New message     Pt request Dr Antoine PocheHochrein ONLY call him. He has a question about a medication and he want Dr Antoine PocheHochrein only

## 2014-01-19 NOTE — Telephone Encounter (Signed)
Discussed with the patient.

## 2014-02-17 ENCOUNTER — Telehealth: Payer: Self-pay | Admitting: Cardiology

## 2014-02-17 NOTE — Telephone Encounter (Signed)
Spoke to pt. Informed him that Dr. Antoine PocheHochrein was aware of information given earlier and he suggested that it was ok to continue with Lipitor. Pt stated he will continue it and address any issues further at appt later this month.

## 2014-02-17 NOTE — Telephone Encounter (Signed)
I would suggest, if the pain is no better off of Lipitor, that he restart it.

## 2014-02-17 NOTE — Telephone Encounter (Signed)
New message           Pt is now back on his lipitor after stopping for 30 days but still has pain in his legs / please give pt a call

## 2014-02-17 NOTE — Telephone Encounter (Signed)
Returning the call .. Please Call  °

## 2014-02-17 NOTE — Telephone Encounter (Signed)
Spoke with pt.  Pt stated that he began his Lipitor 02/16/2014, after discontinuing it for 30 days per MD. During this time he stated that his tailbone pain stopped, but that his leg pain continued. Pt describes the pain as a tightness/soreness that ranges from mid gluteal area (buttocks) to mid hamstring/calf. He says it's a muscle pain. He says this pain started once he began his lipitor initially, and that it has not stopped even after stopping lipitor for 30 days. In the mornings it is difficult for him to get going, but once he does he manages with the pain. He has contacted his other MDs (ortho and neuro). He also stopped his workout for a week to see if that would decrease the pain, but says it made no difference. He tries stretching the area and it does not decrease pain.

## 2014-02-23 ENCOUNTER — Ambulatory Visit: Payer: BC Managed Care – PPO | Admitting: Cardiology

## 2014-03-01 ENCOUNTER — Ambulatory Visit: Payer: BC Managed Care – PPO | Admitting: Cardiology

## 2014-03-01 LAB — LIPID PANEL
CHOL/HDL RATIO: 3.7 ratio
CHOLESTEROL: 142 mg/dL (ref 0–200)
HDL: 38 mg/dL — AB (ref >39)
LDL Cholesterol: 65 mg/dL (ref 0–99)
TRIGLYCERIDES: 194 mg/dL — AB (ref <150)
VLDL: 39 mg/dL (ref 0–40)

## 2014-03-04 ENCOUNTER — Encounter: Payer: Self-pay | Admitting: Cardiology

## 2014-03-04 ENCOUNTER — Ambulatory Visit (INDEPENDENT_AMBULATORY_CARE_PROVIDER_SITE_OTHER): Payer: BC Managed Care – PPO | Admitting: Cardiology

## 2014-03-04 VITALS — BP 110/60 | HR 80 | Ht 68.0 in | Wt 166.0 lb

## 2014-03-04 DIAGNOSIS — E785 Hyperlipidemia, unspecified: Secondary | ICD-10-CM

## 2014-03-04 NOTE — Progress Notes (Signed)
    HPI The patient presents for followup of his dyslipidemia. He has a coronary calcium. He's had a significantly elevated lipid level. I did initially put him on Lipitor and had an excellent response with an LDL of 65. However, he took himself off the Lipitor couple of days ago. He did have some muscle aches on the Lipitor. We tried him off of it. He had a little bit better. He started back exam is worse now. He says he never completely recovered with some hamstring tightness. He denies any other cardiovascular symptoms. He denies chest pressure, neck or arm discomfort.   No Known Allergies  Current Outpatient Prescriptions  Medication Sig Dispense Refill  . aluminum chloride (DRYSOL) 20 % external solution Apply topically once a week.    . cetirizine (ZYRTEC) 10 MG tablet Take 10 mg by mouth as needed for allergies.    . Clindamycin Phos-Benzoyl Perox (ACANYA) gel Apply 1 application topically as needed.     . clonazePAM (KLONOPIN) 1 MG tablet Take 1 tablet by mouth at bedtime.    Marland Kitchen. doxycycline (DORYX) 100 MG DR capsule Take 100 mg by mouth as needed.     . fluticasone (FLONASE) 50 MCG/ACT nasal spray Place into both nostrils as needed for allergies or rhinitis.    Marland Kitchen. ibuprofen (ADVIL,MOTRIN) 200 MG tablet Take 200-400 mg by mouth every 6 (six) hours as needed for mild pain.    . minoxidil (ROGAINE) 2 % external solution Apply topically 2 (two) times daily.    . Multiple Vitamin (MULTIVITAMIN WITH MINERALS) TABS tablet Take 1 tablet by mouth once a week.     . propranolol (INDERAL) 20 MG tablet      No current facility-administered medications for this visit.    Past Medical History  Diagnosis Date  . 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 Spine surgery    . Laminectomy    . Lumbar microdiscectomy    . Duramater patching      ROS:  As stated in the HPI and negative for all other  systems.  PHYSICAL EXAM BP 110/60 mmHg  Pulse 80  Ht 5\' 8"  (1.727 m)  Wt 166 lb (75.297 kg)  BMI 25.25 kg/m2 GENERAL:  Well appearing NECK:  No jugular venous distention, waveform within normal limits, carotid upstroke brisk and symmetric, no bruits, no thyromegaly LUNGS:  Clear to auscultation bilaterally CHEST:  Unremarkable HEART:  PMI not displaced or sustained,S1 and S2 within normal limits, no S3, no S4, no clicks, no rubs, no murmurs ABD:  Flat, positive bowel sounds normal in frequency in pitch, no bruits, no rebound, no guarding, no midline pulsatile mass, no hepatomegaly, no splenomegaly EXT:  2 plus pulses throughout, no edema, no cyanosis no clubbing   ASSESSMENT AND PLAN  HYPERLIPIDEMIA:   We had a long discussion about this. For now we will try keep him off of the statin and then repeat a lipid profile. If his muscle aches do not improve I will follow CK.  We will discuss restarting a statin after the followup lipid. He's going to start some CoQ10.  Lab Results  Component Value Date   CHOL 142 03/01/2014   TRIG 194* 03/01/2014   HDL 38* 03/01/2014   LDLCALC 65 03/01/2014

## 2014-03-04 NOTE — Patient Instructions (Signed)
Your physician recommends that you schedule a follow-up appointment in: 6 months with Dr. Antoine PocheHochrein  Have your Lipid panel done in 8 weeks

## 2014-03-05 ENCOUNTER — Telehealth: Payer: Self-pay | Admitting: Cardiology

## 2014-03-05 NOTE — Telephone Encounter (Signed)
Pt called in stating that he was in to see Dr. Antoine PocheHochrein yesterday and they discussed possible options of changing his statin drug. He wanted to know that instead of changing the drug, could his current dosage be lowered to offset effects. Please call  Thanks

## 2014-03-05 NOTE — Telephone Encounter (Signed)
Pt. Called today and wants to know if when he starts back on his statin if it would be ok to just decrease the dose on the same med instead of changing the medication

## 2014-03-07 NOTE — Telephone Encounter (Signed)
The plan was to keep him off of statin for now and repeat a lipid profile.

## 2014-03-18 ENCOUNTER — Telehealth: Payer: Self-pay | Admitting: Cardiology

## 2014-03-18 NOTE — Telephone Encounter (Signed)
New message      Pt has been off lipitor for a month.  He is still having muscle ache and cannot work out.  What about CO-Q 10?  If yes, what milligram, soft gel vs tablet and which brand should he take?  Please call.

## 2014-03-19 NOTE — Telephone Encounter (Signed)
Pt. Instructed to start 200 mg of coq10 and to call and make appt. With his pcp to check into his muscle pain pt agreed with plan

## 2014-03-22 ENCOUNTER — Telehealth: Payer: Self-pay | Admitting: Cardiology

## 2014-03-22 NOTE — Telephone Encounter (Signed)
Pt.  Instructed that he probably needs ck levels,cbc,and crp

## 2014-03-22 NOTE — Telephone Encounter (Signed)
[facility]

## 2014-03-22 NOTE — Telephone Encounter (Signed)
Pt needs clarity on a test that needs to ran on him

## 2014-04-02 ENCOUNTER — Telehealth: Payer: Self-pay | Admitting: Cardiology

## 2014-04-02 NOTE — Telephone Encounter (Signed)
New Message     Patient is calling back to speak about issues that he was having in an earlier visit  And was speaking to you about . Please call patient

## 2014-04-06 NOTE — Telephone Encounter (Signed)
I have spoken to this pt. And he is going to see his PCP and after that if the PCP thinks the pt. Needs to see us sooner for him to make an appt, pt. Agreed with plan

## 2014-04-15 ENCOUNTER — Telehealth: Payer: Self-pay | Admitting: Cardiology

## 2014-04-15 NOTE — Telephone Encounter (Signed)
Pt called in stating that he finished the bottle of Coq-10 with his dosage progressing from 200 to 300mg . He states that he is still having pain in his legs and is considering buying another bottle and increasing his dosage to 400mg . He would like to take to dr. Jenene SlickerHochrein's nurse before pursuing this option. Please call  Thanks

## 2014-04-15 NOTE — Telephone Encounter (Signed)
FORWARD TO JC WILDMAN LPN 

## 2014-04-19 NOTE — Telephone Encounter (Signed)
Pt. Wants advise on Co Q 10 use, please read previous note

## 2014-04-24 NOTE — Telephone Encounter (Signed)
CoQ 10 can be used at 400 mg.  Soft gels are more bioavailable.

## 2014-06-04 ENCOUNTER — Encounter: Payer: Self-pay | Admitting: Cardiology

## 2014-06-04 ENCOUNTER — Ambulatory Visit (INDEPENDENT_AMBULATORY_CARE_PROVIDER_SITE_OTHER): Payer: BLUE CROSS/BLUE SHIELD | Admitting: Cardiology

## 2014-06-04 VITALS — BP 122/82 | HR 80 | Ht 68.0 in | Wt 164.0 lb

## 2014-06-04 DIAGNOSIS — I2510 Atherosclerotic heart disease of native coronary artery without angina pectoris: Secondary | ICD-10-CM

## 2014-06-04 DIAGNOSIS — E785 Hyperlipidemia, unspecified: Secondary | ICD-10-CM

## 2014-06-04 DIAGNOSIS — R931 Abnormal findings on diagnostic imaging of heart and coronary circulation: Secondary | ICD-10-CM

## 2014-06-04 MED ORDER — PRAVASTATIN SODIUM 40 MG PO TABS: 40.0000 mg | ORAL_TABLET | Freq: Every evening | ORAL | Status: DC

## 2014-06-04 NOTE — Progress Notes (Signed)
HPI The patient presents for followup of his dyslipidemia. He has  coronary calcium. He's had a significantly elevated lipid level. I did initially put him on Lipitor and had an excellent response with an LDL of 65.  However, he ultimately did not tolerate Lipitor and we tried him on and off this drug multiple times and each time it exacerbated significant muscle aches particularly in his hamstrings. He now returns with a cholesterol and his LDL is 181 off of statins. His triglycerides are 217 with a total of 272.  He's unable to exercise because of his muscle aches. He denies any chest pressure, neck or arm discomfort. He's not had any new palpitations, presyncope or syncope.  No Known Allergies  Current Outpatient Prescriptions  Medication Sig Dispense Refill  . aluminum chloride (DRYSOL) 20 % external solution Apply topically once a week.    . cetirizine (ZYRTEC) 10 MG tablet Take 10 mg by mouth as needed for allergies.    . Clindamycin Phos-Benzoyl Perox (ACANYA) gel Apply 1 application topically as needed.     . clonazePAM (KLONOPIN) 2 MG tablet 1 mg in the morning and 1 1/2 at night    . doxycycline (DORYX) 100 MG DR capsule Take 100 mg by mouth as needed.     . fluticasone (FLONASE) 50 MCG/ACT nasal spray Place into both nostrils as needed for allergies or rhinitis.    . minoxidil (ROGAINE) 2 % external solution Apply topically 2 (two) times daily.    . Multiple Vitamin (MULTIVITAMIN WITH MINERALS) TABS tablet Take 1 tablet by mouth once a week.     . propranolol ER (INDERAL LA) 60 MG 24 hr capsule Take 60 mg by mouth daily.  0   No current facility-administered medications for this visit.    Past Medical History  Diagnosis Date  . 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 Spine surgery    . Laminectomy    . Lumbar microdiscectomy    . Duramater patching      ROS:  As stated in the  HPI and negative for all other systems.  PHYSICAL EXAM Ht 5\' 8"  (1.727 m)  Wt 164 lb (74.39 kg)  BMI 24.94 kg/m2 GENERAL:  Well appearing NECK:  No jugular venous distention, waveform within normal limits, carotid upstroke brisk and symmetric, no bruits, no thyromegaly LUNGS:  Clear to auscultation bilaterally CHEST:  Unremarkable HEART:  PMI not displaced or sustained,S1 and S2 within normal limits, no S3, no S4, no clicks, no rubs, no murmurs ABD:  Flat, positive bowel sounds normal in frequency in pitch, no bruits, no rebound, no guarding, no midline pulsatile mass, no hepatomegaly, no splenomegaly EXT:  2 plus pulses throughout, no edema, no cyanosis no clubbing   ASSESSMENT AND PLAN  HYPERLIPIDEMIA:   Given his LDL of 181 and his coronary calcium at such a young age along with family history I think trying to get his LDL back down is important in primary risk reduction. We reviewed these data at length. For now I'm going to try another statin that may cause less muscle aches. He had such an excellent response to Lipitor he may tolerate and do well with pravastatin 40 mg. If not we will need to consider referral to our lipid clinic and PCKS9 inhibitors.  MUSCLE ACHES:  I will refer him to the sports medicine clinic. If  there is no clear etiology from this standpoint I will leave the next stop would be rheumatology.

## 2014-06-04 NOTE — Patient Instructions (Addendum)
Your physician recommends that you schedule a follow-up appointment in: as needed with Dr. Antoine PocheHochrein  We will contact Dr. Electa SniffBurt Fields who is a sports medicine physician and their office will contact you with an appt.  Start taking Pravastatin  40 mg as directed

## 2014-06-08 ENCOUNTER — Encounter: Payer: Self-pay | Admitting: Cardiology

## 2014-06-11 ENCOUNTER — Ambulatory Visit: Payer: Self-pay | Admitting: Cardiology

## 2014-06-14 ENCOUNTER — Telehealth: Payer: Self-pay | Admitting: Cardiology

## 2014-06-14 ENCOUNTER — Other Ambulatory Visit: Payer: Self-pay | Admitting: *Deleted

## 2014-06-14 DIAGNOSIS — M791 Myalgia, unspecified site: Secondary | ICD-10-CM

## 2014-06-14 NOTE — Telephone Encounter (Signed)
Order put in again with Dr Enid BaasKarl Fields

## 2014-06-14 NOTE — Telephone Encounter (Signed)
Order put in for amb referral.

## 2014-06-14 NOTE — Telephone Encounter (Signed)
Pt called in wanting to know what was the name of the sports medicine doctor he was going to be referred to because they have not contacted him yet for an appointment. Please call back  Thanks

## 2014-06-21 ENCOUNTER — Telehealth: Payer: Self-pay | Admitting: Cardiology

## 2014-06-21 NOTE — Telephone Encounter (Signed)
He can be referred either to Dr. Corliss Skainseveshwar or Dr. Kellie Simmeringruslow.  Evaluate muscle aches.

## 2014-06-21 NOTE — Telephone Encounter (Signed)
Will defer to Dr. Antoine PocheHochrein to advise on whom to recommend for rheumatology

## 2014-06-21 NOTE — Telephone Encounter (Signed)
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   He has a sports medicine appt on 07-27-14.  Dr Antoine PocheHochrein wanted him to see a rheumotologist also.  Since he has a short time here----do Dr Antoine PocheHochrein still think it is a good idea to see a rheumotologist and if so, do you know one to refer him to?

## 2014-06-23 NOTE — Telephone Encounter (Signed)
Pt. Called and given information from Dr. Antoine PocheHochrein , pt. Stated he may [facility] to [facility] he stated he will call me back with a name for the referral

## 2014-06-28 ENCOUNTER — Other Ambulatory Visit: Payer: Self-pay | Admitting: *Deleted

## 2014-06-28 DIAGNOSIS — M791 Myalgia, unspecified site: Secondary | ICD-10-CM

## 2014-07-01 ENCOUNTER — Encounter: Payer: Self-pay | Admitting: Cardiology

## 2014-07-06 ENCOUNTER — Other Ambulatory Visit: Payer: Self-pay | Admitting: *Deleted

## 2014-07-06 MED ORDER — PRAVASTATIN SODIUM 40 MG PO TABS
ORAL_TABLET | ORAL | Status: DC
Start: 2014-07-06 — End: 2014-07-27

## 2014-07-21 ENCOUNTER — Ambulatory Visit: Payer: BLUE CROSS/BLUE SHIELD | Admitting: Sports Medicine

## 2014-07-23 ENCOUNTER — Telehealth: Payer: Self-pay | Admitting: Cardiology

## 2014-07-23 NOTE — Telephone Encounter (Signed)
LMOMTCB

## 2014-07-23 NOTE — Telephone Encounter (Signed)
Pt had gone back up to 60mg  daily of pravastatin from 40mg . This was approx 2 weeks ago. He began to have muscle pain again.  Advised reducing dose back to 40mg  daily, would defer to Dr. Antoine PocheHochrein to see if another statin trial needed or if we can move to non-statin option.  WIll call back patient with Dr. Jenene SlickerHochrein's advice.  Pt's next OV 4/25.

## 2014-07-23 NOTE — Telephone Encounter (Signed)
Refer him to our lipid clinic for consideration of PCSK9.

## 2014-07-23 NOTE — Telephone Encounter (Signed)
Molli HazardMatthew is calling because he has been taking 60mg  of pravastatin and want to expierence the same muscle pain as he did when on Lipitor . Please call    Thanks

## 2014-07-26 NOTE — Telephone Encounter (Signed)
Spoke with pt, Follow up scheduled with kristin pharm md.

## 2014-07-26 NOTE — Telephone Encounter (Signed)
Left message for pt to call.

## 2014-07-26 NOTE — Telephone Encounter (Signed)
Pt is returning Debra's call. He thinks that it may be in regards to starting another statin drug since pravastatin gives him muscle pain . Please call back  Thanks

## 2014-07-27 ENCOUNTER — Encounter: Payer: Self-pay | Admitting: Sports Medicine

## 2014-07-27 ENCOUNTER — Encounter: Payer: Self-pay | Admitting: Cardiology

## 2014-07-27 ENCOUNTER — Ambulatory Visit (INDEPENDENT_AMBULATORY_CARE_PROVIDER_SITE_OTHER): Payer: BLUE CROSS/BLUE SHIELD | Admitting: Sports Medicine

## 2014-07-27 VITALS — BP 113/72 | HR 66 | Ht 68.0 in | Wt 165.0 lb

## 2014-07-27 DIAGNOSIS — M629 Disorder of muscle, unspecified: Secondary | ICD-10-CM | POA: Diagnosis not present

## 2014-07-27 DIAGNOSIS — M519 Unspecified thoracic, thoracolumbar and lumbosacral intervertebral disc disorder: Secondary | ICD-10-CM

## 2014-07-27 NOTE — Assessment & Plan Note (Addendum)
I gave him a series of hamstring exercises to do daily Extender exercises Diver exercises  add some short walks along with semirecumbent biking  Recheck in one month after weaning statins  Consider adding gabapentin at night if not improved

## 2014-07-27 NOTE — Progress Notes (Signed)
Patient ID: Gary PouchMatthew Arnold, male   DOB: 04/18/1980, 34 y.o.   MRN: [id number]  Graduate student in psychology at [employer] 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 Antoine PocheHochrein  Clear pattern of worsening pain in MM after starting lipitor Stopping the Lipitor led to pain to almost resolve This continued to lesser extent after switching to pravastatin Off lipitor since early Dec. 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

## 2014-07-27 NOTE — Patient Instructions (Signed)
I think you should try weaning statins  I think we should try a few simple exercises  I think we should add some medication only if you are not responding to statin weaning  Easy stretches  Some easy walking along with the regular biking

## 2014-07-27 NOTE — Assessment & Plan Note (Signed)
He has a good history and positive scans for lumbar disc disease  I don't think this is active now but may have contributed to the radicular symptoms causing tightness in his hamstrings

## 2014-07-28 ENCOUNTER — Ambulatory Visit (INDEPENDENT_AMBULATORY_CARE_PROVIDER_SITE_OTHER): Payer: BLUE CROSS/BLUE SHIELD | Admitting: Pharmacist Clinician (PhC)/ Clinical Pharmacy Specialist

## 2014-07-28 VITALS — Ht 68.0 in | Wt 164.4 lb

## 2014-07-28 DIAGNOSIS — E785 Hyperlipidemia, unspecified: Secondary | ICD-10-CM | POA: Diagnosis not present

## 2014-07-28 NOTE — Patient Instructions (Signed)
Start Zetia 10 mg once daily, but wait until your muscle pain has gone. Call me if you are not able to tolerate this.  Kristin at [phone]  We will repeat labs about 8 weeks after starting Zetia  If you can get information on your parents LDL cholesterol please let us know

## 2014-07-29 ENCOUNTER — Telehealth: Payer: Self-pay | Admitting: Pharmacist Clinician (PhC)/ Clinical Pharmacy Specialist

## 2014-07-29 DIAGNOSIS — E785 Hyperlipidemia, unspecified: Secondary | ICD-10-CM

## 2014-07-29 NOTE — Telephone Encounter (Signed)
Pt called office, [facility].  Needs order to have drawn elsewhere.  Orders in system

## 2014-07-30 ENCOUNTER — Encounter: Payer: Self-pay | Admitting: Pharmacist Clinician (PhC)/ Clinical Pharmacy Specialist

## 2014-07-30 MED ORDER — EZETIMIBE 10 MG PO TABS: 10.0000 mg | ORAL_TABLET | Freq: Every day | ORAL | Status: DC

## 2014-07-30 NOTE — Progress Notes (Signed)
07/30/2014 Gary PouchMatthew Hodapp 05/08/1980 [id number]   HPI:  Gary Arnold is a 34 y.o. male patient of Dr Antoine PocheHochrein, who presents today for a lipid clinic evaluation.  Patient states he has had known hyperlipidemia for many years, had first cholesterol labs drawn as a teenager when taking Accutane.  Of the multiple LDL results we have, it appears that his highest reading was 216.     RF:  Elevated coronary calcium   Meds: none currently    Intolerant: atorvastatin and pravastatin both caused severe myalgias  Family history:  Both parents with hyperlipidemia, no history of stroke, MI or CAD that patient is aware of.  Admits to poor history about father, apparently not in contact  Diet:  Pt follows very healthy diet.  Only eats low-fat, low carb diet with lots of fruits and vegetables  Exercise: daily for 30-60 min    Labs:  07/2014:  TC 179, TG 196, HDL 35, LDL 105  (pravastatin inc from 40 to 60 on 07/06/14) 06/2014:  TC 194, TG 175, HDL 43, LDL 116  (started pravastatin 40 mg on 06/11/14) 05/2014:  TC 272, TG 217, HDL 43, LDL 181  (no statin since 40/981112/2015) 02/2014: TC 142, TG 194, HDL 38, LDL 65   (atorvastatin 40 mg)  Current Outpatient Prescriptions  Medication Sig Dispense Refill  . cetirizine (ZYRTEC) 10 MG tablet Take 10 mg by mouth as needed for allergies.    . Clindamycin Phos-Benzoyl Perox (ACANYA) gel Apply 1 application topically as needed.     . clonazePAM (KLONOPIN) 2 MG tablet 1 mg in the morning and 1 1/2 at night    . doxycycline (DORYX) 100 MG DR capsule Take 100 mg by mouth as needed.     . ezetimibe (ZETIA) 10 MG tablet Take 1 tablet (10 mg total) by mouth daily. 30 tablet 5  . fluticasone (FLONASE) 50 MCG/ACT nasal spray Place into both nostrils as needed for allergies or rhinitis.    . minoxidil (ROGAINE) 2 % external solution Apply topically 2 (two) times daily.    . Multiple Vitamin (MULTIVITAMIN WITH MINERALS) TABS tablet Take 1 tablet by mouth once a week.     .  propranolol ER (INDERAL LA) 60 MG 24 hr capsule Take 60 mg by mouth daily.  0   No current facility-administered medications for this visit.    Allergies  Allergen Reactions  . Atorvastatin Other (See Comments)    myalgias  . Pravastatin Other (See Comments)    myalgia    Past Medical History  Diagnosis Date  . 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    Phillips HayKristin Jia Mohamed PharmD CPP  [facility]

## 2014-07-30 NOTE — Assessment & Plan Note (Signed)
[name] would appear to have a familial hyperlipidemia, based on his young age and LDL readings without treatment in the low 200s.  Unfortunately, based on the [place] criteria, he does not qualify.  His family history appears to be significant for increased LDL and triglycerides, but there is no history of any MI, stroke or CAD in any first or second degree relatives.  He does not have good contact with his father, but is going to try and clarify family history and cholesterol levels with him in the next few weeks.  He does have a calcium score of 6 (87 th percentile for age and sex), but we are unable to use this information in determining familial disease.  Because he does not have any ASCVD either, he does not meet criteria for a PCSK-9 inhibitor.  This was explained to the patient and he understands.  He was unable to tolerate statin drugs, which is unfortunate, as atorvastatin brought his LDL down to 65.  For now we will try Zetia 10 mg daily.  He knows to call if he is unable to tolerate this.   I will see him again in about 2 months.  Before his visit he is to repeat lipid labs with NMR.  At that time if triglycerides are still high, will recommend fenofibrate.  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

## 2014-08-09 ENCOUNTER — Ambulatory Visit: Payer: BLUE CROSS/BLUE SHIELD | Admitting: Cardiology

## 2014-08-18 ENCOUNTER — Ambulatory Visit: Payer: BLUE CROSS/BLUE SHIELD | Admitting: Pharmacist Clinician (PhC)/ Clinical Pharmacy Specialist

## 2014-08-20 ENCOUNTER — Encounter: Payer: Self-pay | Admitting: Pharmacist Clinician (PhC)/ Clinical Pharmacy Specialist

## 2014-08-20 ENCOUNTER — Ambulatory Visit (INDEPENDENT_AMBULATORY_CARE_PROVIDER_SITE_OTHER): Payer: BLUE CROSS/BLUE SHIELD | Admitting: Pharmacist Clinician (PhC)/ Clinical Pharmacy Specialist

## 2014-08-20 VITALS — Ht 68.0 in

## 2014-08-20 DIAGNOSIS — E785 Hyperlipidemia, unspecified: Secondary | ICD-10-CM

## 2014-08-20 MED ORDER — ROSUVASTATIN CALCIUM 5 MG PO TABS: ORAL_TABLET | ORAL | Status: AC

## 2014-08-20 NOTE — Patient Instructions (Signed)
Start on Crestor 5 mg once weekly.  If you continue to have problems with muscle pains please stop.  Start Fish oil Ashby Dawes(Nature Made).  Start with 1 capsule twice daily and after 2 weeks increase to 2 capsules twice daily.  Repeat lipid labs (NMR and liver function) Monday June 6 - please fast for 12 hours prior to the appointment

## 2014-08-20 NOTE — Progress Notes (Signed)
08/20/2014 Gary PouchMatthew Moctezuma 12/31/1980 [id number]   HPI:  Gary PouchMatthew Chichester is a 34 y.o. male patient of Dr Antoine PocheHochrein, who presents today for a lipid clinic evaluation.  Patient states he has had known hyperlipidemia for many years, had first cholesterol labs drawn as a teenager when taking Accutane.  Of the multiple LDL results we have, it appears that his highest reading was 216.     RF:  Elevated coronary calcium of 6 (87 percentile for age and sex)   Meds: stopped Zetia this past week due to myalgias, especially hamstring   Intolerant: atorvastatin and pravastatin both caused severe myalgias as did zetia  Family history:  Both parents with hyperlipidemia, no history of stroke, MI or CAD that patient is aware of.  Admits to poor history about father, apparently not in contact  Diet:  Pt follows very healthy diet.  Only eats low-fat, low carb diet with lots of fruits and vegetables  Exercise: daily for 30-60 min    Labs:  08/2014:  TC 184, TG 194, HDL 50, LDL 95 (Zetia 10 mg) 07/2014:  TC 179, TG 196, HDL 35, LDL 105  (pravastatin inc from 40 to 60 on 07/06/14) 06/2014:  TC 194, TG 175, HDL 43, LDL 116  (started pravastatin 40 mg on 06/11/14) 05/2014:  TC 272, TG 217, HDL 43, LDL 181  (no statin since 40/981112/2015) 02/2014: TC 142, TG 194, HDL 38, LDL 65   (atorvastatin 40 mg)  Current Outpatient Prescriptions  Medication Sig Dispense Refill  . cetirizine (ZYRTEC) 10 MG tablet Take 10 mg by mouth as needed for allergies.    . Clindamycin Phos-Benzoyl Perox (ACANYA) gel Apply 1 application topically as needed.     . clonazePAM (KLONOPIN) 2 MG tablet 1 mg in the morning and 1 1/2 at night    . doxycycline (DORYX) 100 MG DR capsule Take 100 mg by mouth as needed.     . fluticasone (FLONASE) 50 MCG/ACT nasal spray Place into both nostrils as needed for allergies or rhinitis.    . minoxidil (ROGAINE) 2 % external solution Apply topically 2 (two) times daily.    . Multiple Vitamin (MULTIVITAMIN WITH  MINERALS) TABS tablet Take 1 tablet by mouth once a week.     . propranolol ER (INDERAL LA) 60 MG 24 hr capsule Take 60 mg by mouth daily.  0   No current facility-administered medications for this visit.    Allergies  Allergen Reactions  . Atorvastatin Other (See Comments)    myalgias  . Pravastatin Other (See Comments)    myalgia  . Zetia [Ezetimibe] Other (See Comments)    myalgias    Past Medical History  Diagnosis Date  . 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    Phillips HayKristin Kimbley Sprague PharmD CPP [facility]

## 2014-08-20 NOTE — Assessment & Plan Note (Signed)
Pt responds well with lowering of LDL with both statin drugs and ezetimibe. Unfortunately he cannot tolerate the muscle pains that come with both classes of medication.  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  Any approval we might be able to get would only last for 1 month at the most.  Advised patient that his best course would be to find a cardiologist once he is settled there, and they can pursue the PCSK-9 inhibitor with his updated insurance.  He has no ASCVD, but with a non treated LDL of 216 in July of 2015, he should be able to apply as [place].    For now, he is willing to try Crestor 5 mg once weekly.  I have given him a sample of the medication and he will give it a try.  If he can tolerate it, I suggested he get his labs repeated, with NMR, in early June, just before he moves.  I can see him after that to review those results, either in person or over the phone.  Pt had a list of possible drugs to help lower cholesterol that several people had suggested to him.  Reviewed this list with him, included WelChol, fibrates, fish oil, Juxtapid.    Would not like to start fenofibrate right now, as we are having trouble with side effects of LDL lowering therapies, and I don't want to start 2 treatments simultaneously.  I did suggest that he would probably be fine taking fish oil, as he states he did take this previously.  He will start with 1 capsule twice daily for 2 weeks, then can increase to 2 capsules twice daily.

## 2014-08-24 ENCOUNTER — Ambulatory Visit: Payer: BLUE CROSS/BLUE SHIELD | Admitting: Cardiology

## 2014-08-27 ENCOUNTER — Ambulatory Visit: Payer: BLUE CROSS/BLUE SHIELD | Admitting: Cardiology

## 2014-08-31 ENCOUNTER — Telehealth: Payer: Self-pay | Admitting: Pharmacist Clinician (PhC)/ Clinical Pharmacy Specialist

## 2014-08-31 ENCOUNTER — Ambulatory Visit: Payer: BLUE CROSS/BLUE SHIELD | Admitting: Sports Medicine

## 2014-08-31 NOTE — Telephone Encounter (Signed)
Could not tolerate Crestor 5 mg once weekly.  Took x 2, but ended up taking ibuprofen to get past the pain.  Would like to know if he can try Zetia once weekly  Returned call, stated we have no data on people taking Zetia once weekly, but he could try taking 1/2 tablet daily or every other day.  He has appt in 3 weeks, not sure we will see any benefit from it that soon.  After that [place] of [place].

## 2014-09-22 ENCOUNTER — Ambulatory Visit: Payer: BLUE CROSS/BLUE SHIELD | Admitting: Pharmacist Clinician (PhC)/ Clinical Pharmacy Specialist

## 2014-09-22 LAB — HEPATIC FUNCTION PANEL
ALT: 28 U/L (ref 0–53)
AST: 31 U/L (ref 0–37)
Albumin: 4.4 g/dL (ref 3.5–5.2)
Alkaline Phosphatase: 60 U/L (ref 39–117)
BILIRUBIN INDIRECT: 0.6 mg/dL (ref 0.2–1.2)
Bilirubin, Direct: 0.1 mg/dL (ref 0.0–0.3)
TOTAL PROTEIN: 6.6 g/dL (ref 6.0–8.3)
Total Bilirubin: 0.7 mg/dL (ref 0.2–1.2)

## 2014-09-23 ENCOUNTER — Encounter: Payer: Self-pay | Admitting: Pharmacist Clinician (PhC)/ Clinical Pharmacy Specialist

## 2014-09-23 ENCOUNTER — Ambulatory Visit: Payer: BLUE CROSS/BLUE SHIELD | Admitting: Pharmacist Clinician (PhC)/ Clinical Pharmacy Specialist

## 2014-09-23 ENCOUNTER — Ambulatory Visit (INDEPENDENT_AMBULATORY_CARE_PROVIDER_SITE_OTHER): Payer: BLUE CROSS/BLUE SHIELD | Admitting: Pharmacist Clinician (PhC)/ Clinical Pharmacy Specialist

## 2014-09-23 DIAGNOSIS — E785 Hyperlipidemia, unspecified: Secondary | ICD-10-CM | POA: Diagnosis not present

## 2014-09-23 LAB — NMR LIPOPROFILE WITH LIPIDS
Cholesterol, Total: 214 mg/dL — ABNORMAL HIGH (ref 100–199)
HDL PARTICLE NUMBER: 28.2 umol/L — AB (ref >=30.5)
HDL Size: 8.9 nm — ABNORMAL LOW (ref >=9.2)
HDL-C: 41 mg/dL (ref >39)
LDL (calc): 104 mg/dL — ABNORMAL HIGH (ref 0–99)
LDL Particle Number: 2158 nmol/L — ABNORMAL HIGH (ref <1000)
LDL Size: 19.7 nm (ref >=20.8)
LP-IR SCORE: 37 (ref <=45)
Large HDL-P: 6.2 umol/L (ref >=4.8)
Large VLDL-P: <0.8 nmol/L (ref <=2.7)
Small LDL Particle Number: 1413 nmol/L — ABNORMAL HIGH (ref <=527)
Triglycerides: 345 mg/dL — ABNORMAL HIGH (ref 0–149)
VLDL Size: 45.1 nm (ref <=46.6)

## 2014-09-23 MED ORDER — OMEGA-3-ACID ETHYL ESTERS 1 G PO CAPS: 2.0000 g | ORAL_CAPSULE | Freq: Two times a day (BID) | ORAL | Status: AC

## 2014-09-23 NOTE — Progress Notes (Signed)
08/20/2014 Gary Arnold [birth year] [id number]   HPI:  Gary Arnold is a 34 y.o. male patient of Dr Antoine Poche, who presents today for a lipid clinic evaluation.  Patient states he has had known hyperlipidemia for many years, had first cholesterol labs drawn as a teenager when taking Accutane.  Of the multiple LDL results we have, it appears that his highest reading was 216.     RF:  Elevated coronary calcium of 6 (87 percentile for age and sex)   Meds: stopped Zetia this past week due to myalgias, especially hamstring, coccyx   Intolerant: atorvastatin and pravastatin both caused severe myalgias as did zetia  Family history:  Both parents with hyperlipidemia, no history of stroke, MI or CAD that patient is aware of.  Admits to poor history about father, apparently not in regular contact  Diet:  Pt follows very healthy diet.  Only eats low-fat, low carb diet with lots of fruits and vegetables; has had trouble staying with healthy diet as has been going back and forth to [place] while moving  Exercise: daily for 30-60 min    Labs:  09/2014:  TC 214, TG 345, HDL 41, LDL 104, LDL Particle Number [id number] (Zetia 5 mg q2-3 days) 08/2014:  TC 184, TG 194, HDL 50, LDL 95 (Zetia 10 mg) 07/2014:  TC 179, TG 196, HDL 35, LDL 105  (pravastatin inc from 40 to 60 on 07/06/14) 06/2014:  TC 194, TG 175, HDL 43, LDL 116  (started pravastatin 40 mg on 06/11/14) 05/2014:  TC 272, TG 217, HDL 43, LDL 181  (no statin since 01/6268) 02/2014: TC 142, TG 194, HDL 38, LDL 65   (atorvastatin 40 mg)  Current Outpatient Prescriptions  Medication Sig Dispense Refill  . cetirizine (ZYRTEC) 10 MG tablet Take 10 mg by mouth as needed for allergies.    . Clindamycin Phos-Benzoyl Perox (ACANYA) gel Apply 1 application topically as needed.     . clonazePAM (KLONOPIN) 2 MG tablet 1 mg in the morning and 1 1/2 at night    . doxycycline (DORYX) 100 MG DR capsule Take 100 mg by mouth as needed.     . fluticasone (FLONASE) 50  MCG/ACT nasal spray Place into both nostrils as needed for allergies or rhinitis.    . minoxidil (ROGAINE) 2 % external solution Apply topically 2 (two) times daily.    . Multiple Vitamin (MULTIVITAMIN WITH MINERALS) TABS tablet Take 1 tablet by mouth once a week.     . propranolol ER (INDERAL LA) 60 MG 24 hr capsule Take 60 mg by mouth daily.  0   No current facility-administered medications for this visit.    Allergies  Allergen Reactions  . Atorvastatin Other (See Comments)    myalgias  . Pravastatin Other (See Comments)    myalgia  . Zetia [Ezetimibe] Other (See Comments)    myalgias    Past Medical History  Diagnosis Date  . 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    Phillips Hay PharmD CPP [facility]

## 2014-09-23 NOTE — Assessment & Plan Note (Signed)
After stopping Crestor, he waited until muscle aches disappeard before starting Zetia.  Tolerated for a few weeks before pains, especially in coccyx.  Did start an OTC fish oil recently, will switch to Lovaza 2 gm bid for triglycerides.  Would like to start patient on either Repatha or Praluent, but he is in the process of moving to [place] and will have new insurance once he gets there.  I have advised that he get his records and make an appointment with primary care or internal medicine once his insurance is active, so that they can determine his eligibility for these new medications.

## 2014-09-23 NOTE — Patient Instructions (Signed)
Start Lovaza 2 grams twice daily.   Set up an appointment with a Primary Care MD as soon as you know your insurance information.  Stop at the desk and sign a release for medical records

## 2015-11-26 IMAGING — CT CT HEART SCORING
1 of 3 series · 10 of 20 positions shown, 13 images · non-contrast
Comparison: No priors.

CLINICAL DATA: Risk stratification

EXAM:
Coronary Calcium Score
TECHNIQUE: The patient was scanned on a Siemens Sensation 16 slice scanner.
Axial non-contrast 3 mm slices were carried out through the heart.
The data set was analyzed on a dedicated work station and scored
using the Agatson method.

[Series 6: st thins for reformat · axial · 0.66mm/px · z∈[-199,-99]mm · 10 of 124 slices shown, 13 images]
[im 12/124  vessel]
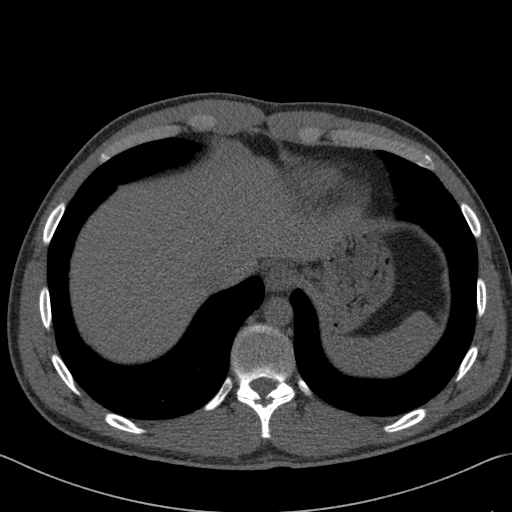
[im 12/124  lung]
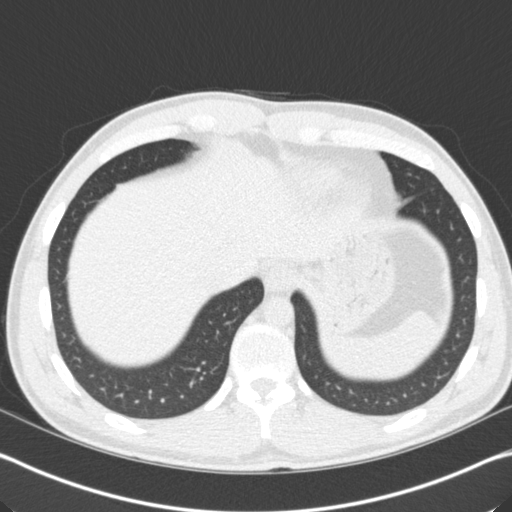
[im 23/124  vessel]
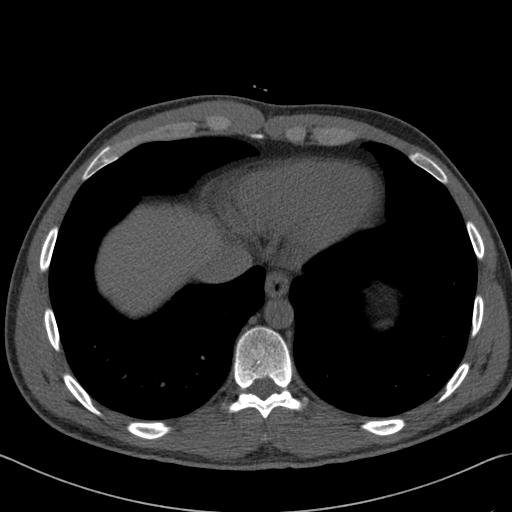
[im 34/124  vessel]
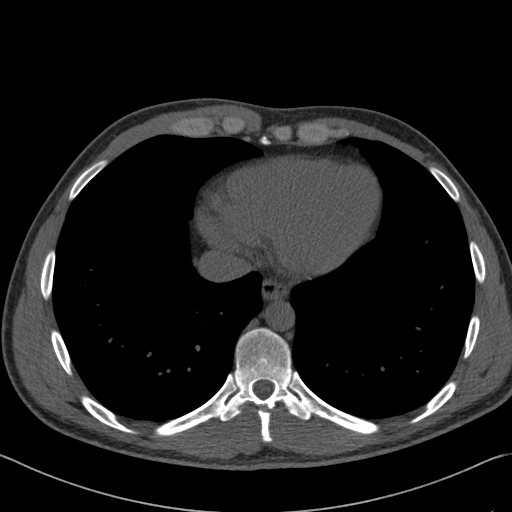
[im 45/124  vessel]
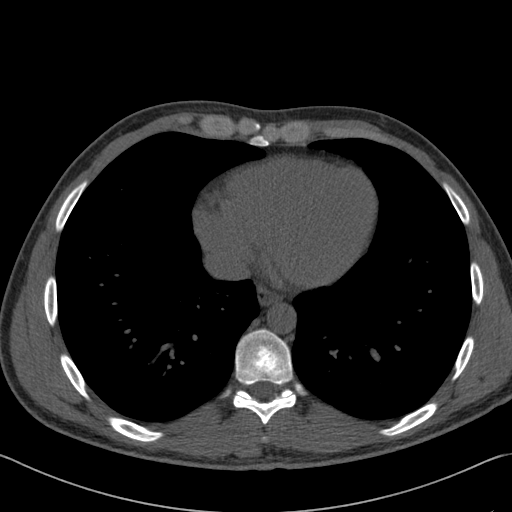
[im 56/124  vessel]
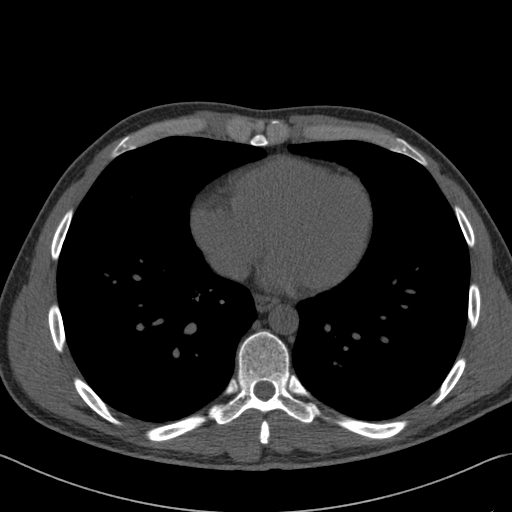
[im 56/124  lung]
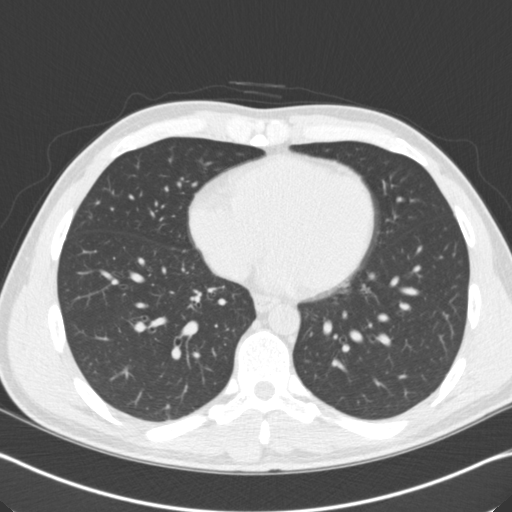
[im 68/124  vessel]
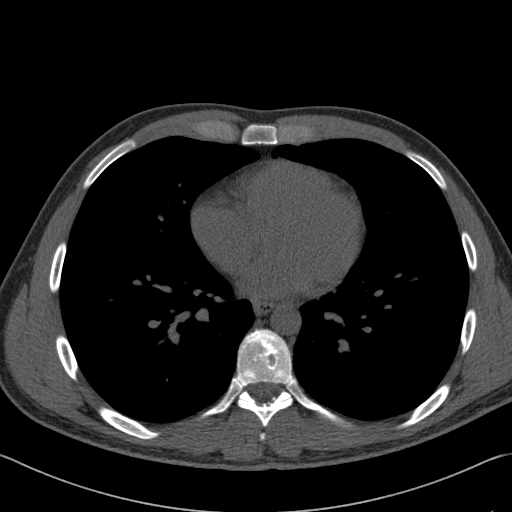
[im 79/124  vessel]
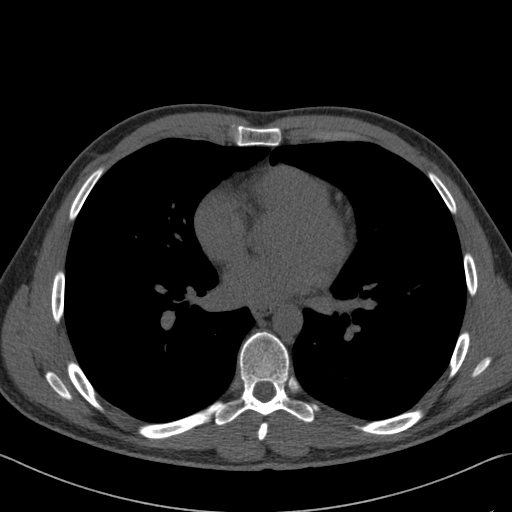
[im 90/124  vessel]
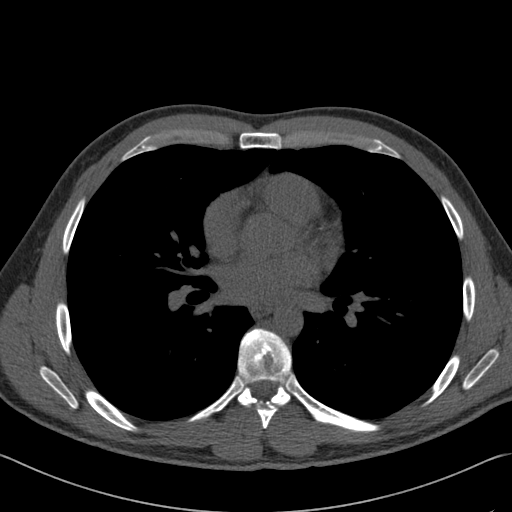
[im 101/124  vessel]
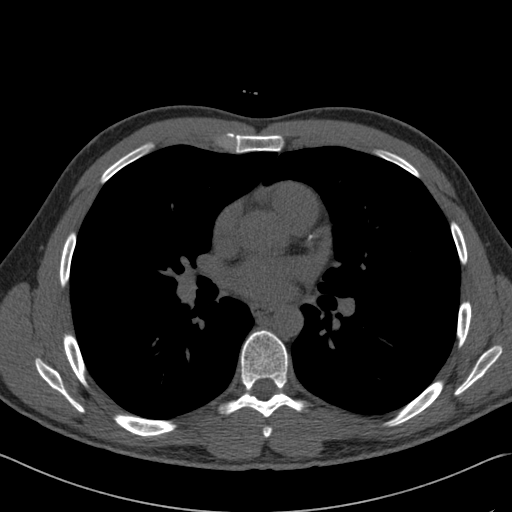
[im 101/124  lung]
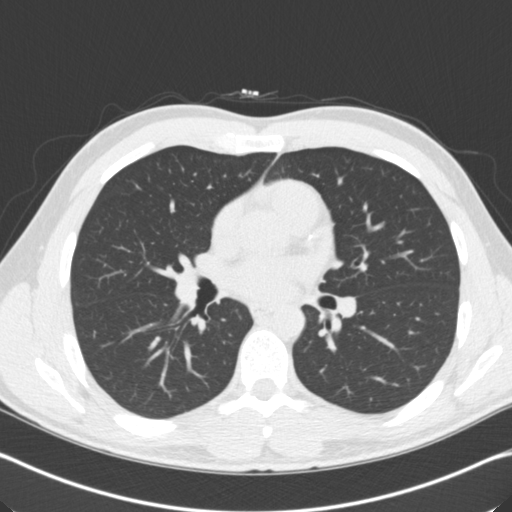
[im 112/124  vessel]
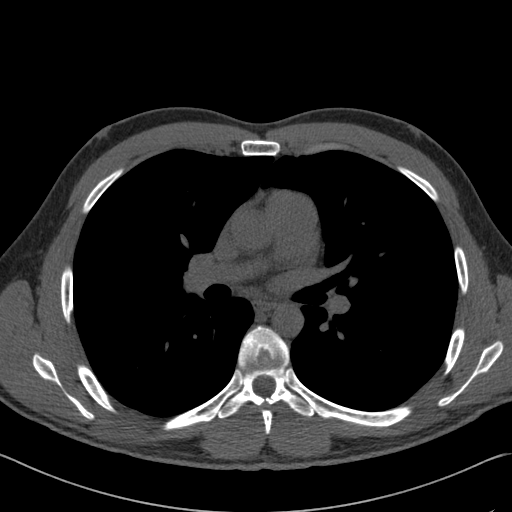

[10 of 20 positions shown; findings below may reference images not displayed]

FINDINGS: Non-cardiac: See separate report from [REDACTED].

Ascending Aorta:  Normal size.

Pericardium: Normal

Coronary arteries:  Originating in normal position.
IMPRESSION: Coronary calcium score of 6. This was 87 percentile for age and sex
matched control.

Iksu Beack

EXAM:
OVER-READ INTERPRETATION  CT CHEST

The following report is an over-read performed by radiologist Dr.
over-read does not include interpretation of cardiac or coronary
anatomy or pathology. The coronary calcium score interpretation by
the cardiologist is attached.
FINDINGS: Within the visualized portions of the thorax there is no acute
consolidative airspace disease, no pleural effusion, no
pneumothorax, no lymphadenopathy and no suspicious appearing
pulmonary nodule or mass. Visualized portions of the upper abdomen
are unremarkable. There are no aggressive appearing lytic or blastic
lesions noted in the visualized portions of the skeleton.
IMPRESSION: 1. No significant incidental noncardiac findings.

## 2016-02-06 ENCOUNTER — Encounter (INDEPENDENT_AMBULATORY_CARE_PROVIDER_SITE_OTHER): Payer: Self-pay | Admitting: Internal Medicine

## 2016-02-06 ENCOUNTER — Ambulatory Visit (INDEPENDENT_AMBULATORY_CARE_PROVIDER_SITE_OTHER): Payer: Commercial Managed Care - HMO | Admitting: Internal Medicine

## 2016-02-06 VITALS — BP 130/80 | HR 70 | Temp 97.8°F | Resp 16 | Ht 68.5 in | Wt 174.1 lb

## 2016-02-06 DIAGNOSIS — R61 Generalized hyperhidrosis: Secondary | ICD-10-CM

## 2016-02-06 DIAGNOSIS — G8929 Other chronic pain: Secondary | ICD-10-CM

## 2016-02-06 DIAGNOSIS — M549 Dorsalgia, unspecified: Secondary | ICD-10-CM

## 2016-02-06 DIAGNOSIS — Z23 Encounter for immunization: Principal | ICD-10-CM

## 2016-02-06 DIAGNOSIS — Z7689 Persons encountering health services in other specified circumstances: Secondary | ICD-10-CM

## 2016-02-06 DIAGNOSIS — L709 Acne, unspecified: Secondary | ICD-10-CM

## 2016-02-06 DIAGNOSIS — E785 Hyperlipidemia, unspecified: Secondary | ICD-10-CM

## 2016-02-06 DIAGNOSIS — M545 Low back pain, unspecified: Secondary | ICD-10-CM

## 2016-02-06 DIAGNOSIS — G4750 Parasomnia, unspecified: Secondary | ICD-10-CM

## 2016-02-06 DIAGNOSIS — L74519 Primary focal hyperhidrosis, unspecified: Secondary | ICD-10-CM

## 2016-02-06 MED ORDER — CLINDAMYCIN PHOS-BENZOYL PEROX 1.2-2.5 % EX GEL
1.0000 | Freq: Every day | CUTANEOUS | 1 refills | Status: DC
Start: 2016-02-06 — End: 2016-02-07

## 2016-02-06 MED ORDER — PROPRANOLOL HCL 60 MG OR TABS
60.0000 mg | ORAL_TABLET | Freq: Every day | ORAL | 1 refills | Status: DC
Start: 2016-02-06 — End: 2016-02-20

## 2016-02-06 MED ORDER — PROPRANOLOL HCL 60 MG OR TABS
60.00 mg | ORAL_TABLET | Freq: Every day | ORAL | Status: DC
Start: ? — End: 2016-02-06

## 2016-02-06 MED ORDER — CLONAZEPAM 1 MG OR TABS
1.0000 mg | ORAL_TABLET | Freq: Two times a day (BID) | ORAL | 1 refills | Status: DC
Start: 2016-02-06 — End: 2016-02-08

## 2016-02-06 MED ORDER — CLONAZEPAM 1 MG OR TABS
1.00 mg | ORAL_TABLET | Freq: Two times a day (BID) | ORAL | Status: DC
Start: ? — End: 2016-02-06

## 2016-02-06 MED ORDER — DOXYCYCLINE HYCLATE 100 MG OR CAPS
100.00 mg | ORAL_CAPSULE | Freq: Two times a day (BID) | ORAL | Status: DC
Start: ? — End: 2016-04-24

## 2016-02-06 MED ORDER — CLINDAMYCIN PHOS-BENZOYL PEROX 1.2-2.5 % EX GEL
CUTANEOUS | Status: DC
Start: ? — End: 2016-02-06

## 2016-02-06 NOTE — Patient Instructions (Addendum)
Sign up for MyChart  Meet with Sports Medicine    Get blood work, fasting    Come back in ~3 months for substance use agreement    Blood work will dictate cardiology evaluation

## 2016-02-06 NOTE — Progress Notes (Signed)
Internal Medicine - Primary Care  Primary Care - [place] is a 35 year old male new patient who presents to establish care.  Establish Care    35 YO man with HLD presenting to establish care.    Patient notes a marked history of HLD, parasomnia, and LBP.     1. HLD - Patient reports on routine cholesterol screening, a LDL >200.  He had genetic testing for familial, which was negative.  He was started on Atorva 12/2013 (cb multiple myalgias).  Switched Pravastatin (myalgias). Then started on Zetia and Fish oil --> myalgias. Crestor --> myalgias.  Has been followed by numerous cardiologists given travel.  Notably, Last lipid panel in Feb cholesterol markedly improved, off medications (LDL ~100, HDL ~30s).    2. 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  Previously followed with psychiatry.  Had former counseling/therapy as well.  Caprice Renshaw has been reduced to '1mg'$ .  Reports no acute concerns with this.    3. Acne - was on accutane in HS.  Stable now.  Uses clinda combo cream (Acanya) which reportedly works well.    4. Hyperhidrosis - reports stable now, but severe off propranolol.      Past Medical History:   Diagnosis Date   . Acne    . Back pain     secondary to herniated disc, from back injury in 2000   . Hyperhidrosis     propanolol tx   . Hyperlipidemia     LDL > 200, TG ~400 at Dx, statin intolerant.  Per pt, r/o for familial HDL   . Parasomnia associated with sexual behavior     Tx with Caprice Renshaw, has been being downtitrated     Past Surgical History:   Procedure Laterality Date   . epidural steroid injection      x5 ESIs, most ecent 2017   . l3/4 laminectomy  2014    @ [facility], 2014     No current outpatient prescriptions on file prior to visit.     No current facility-administered medications on file prior to visit.          No Known Allergies  Family History   Problem Relation Age of Onset   . Cancer Mother      cervical   . No Known Problems  Father    . Cancer Maternal Grandmother      ovarian   . No Known Cancer Maternal Grandfather    . Heart Disease Maternal Grandfather    . Cancer Paternal Grandmother      pancreatic   . Cancer Paternal Grandfather      mesothelioma   . Cancer Brother 40     osteosarcoma     Social History     Social History   . Marital status: Married     Spouse name: N/A   . Number of children: N/A   . Years of education: N/A     Occupational History   . Not on file.     Social History Main Topics   . Smoking status: Never Smoker   . Smokeless tobacco: Never Used   . Alcohol use Yes      Comment: 1-3 drinks infrequently   . Drug use: Not on file   . Sexual activity: Not on file     Other Topics Concern   . Not on file     Social History Narrative   .  No narrative on file           ROS: No fevers/chills/night sweats.  No TIAs or unusual headaches, no dysphagia.  No prolonged cough. No dyspnea or chest pain on exertion.  No abdominal pain, change in bowel habits, black or bloody stools.  No urinary tract or BPH symptoms.  No new or unusual musculoskeletal symptoms.     02/06/16  1503   BP: 130/80   Pulse: 70   Resp: 16   Temp: 97.8 F (36.6 C)   SpO2: 99%       Physical Exam   Constitutional: He is well-developed, well-nourished, and in no distress.   HENT:   Head: Normocephalic and atraumatic.   Mouth/Throat: No oropharyngeal exudate.   Eyes: Conjunctivae are normal. No scleral icterus.   Neck: Normal range of motion. No tracheal deviation present.   Cardiovascular: Normal rate.    No murmur heard.  Pulmonary/Chest: Effort normal. He has no wheezes. He exhibits no tenderness.   Abdominal: Soft. There is no tenderness.   Musculoskeletal: Normal range of motion. He exhibits no edema.   Neurological: He is alert.   Skin: Skin is warm. No rash noted. He is not diaphoretic.   Psychiatric: Affect normal.   Vitals reviewed.      Outside records received and reviewed: [facility] records reviewed including clinic notes as available,  imaging results as available, labs as available, and health maintenance as available.  [place] chart updated as appropriate.  [facility] records requested.    Routine health care maintenance were dealt with today. Appropriate screening labs and studies were ordered, and preventative counseling was done.    Colon [place] screening:  na  Vaccines: See A+P; TDAP/A / flu today  Vitamin D: today  No results found for: VITD25HYDROX, VD2, VD3, VDT  Lipids:  )No results found for: CHOL, HDL, LDLCALC, TRIG, LDLDIRECT    Diabetes screen:No results found for: A1C  Thyroid:  No results found for: TSH  Exercise and diet:  Exercises; no fast food.  Advised 150 min a week.  Meets.  Smoking:  never     Lung CT screening:  na      No results found for: COLORUA, APPEARUA, GLUCOSEUA, BILIUA, KETONEUA, SGUA, BLOODUA, [place], PROTEINUA, UROBILUA, NITRITEUA, LEUKESTUA, [place], RBCUA, EPITHCELLSUA, HYALINEUA, CRYSTALSUA, COMMENTSUA    No results found for: WBC, RBC, HGB, HCT, MCV, MCHC, RDW, PLT, MPV  No results found for: BUN, CREAT, CL, NA, K, TBILI, ALB, TP, AST, ALK, BICARB, ALT, GLU  The ASCVD Risk score Mikey Bussing DC Brooke Bonito, et al., 2013) failed to calculate for the following reasons:    The 2013 ASCVD risk score is only valid for ages 67 to 48    Low Risk: 0 - 5 %  Moderate Risk: 5 - 7.5 %  High Risk: 7.5 - 100 %      In addition other issues separate from routine preventative care were addressed as follows:    Brysin was seen today for establish care.    Diagnoses and all orders for this visit:    Influenza vaccine needed  -     Flu Vaccine, IM >=3 Years    Need for Tdap vaccination  -     Tdap (BOOSTRIX/ADACEL) for patients 45-33 years old    Need for prophylactic vaccination against hepatitis A - pt interested in vaccination.  Works in [employer].  No history of immunizations nor risks for prev infection.  Defer blood testing, empiric vaccination  -  Hep A Vaccine Adult, IM - Admin Now  -     Hep A Vaccine Adult, IM - Admin in 6 months;  Future    Hyperlipidemia, unspecified hyperlipidemia type - Unclear.  Defer cardiology eval / consult pending re-screen given recent level which is not overtly concerning for HLD  -     Lipid Panel Green Plasma Separator Tube; Future    Chronic low back pain without sciatica, unspecified back pain laterality - Longstanding issues with LBP.  5x ESIs.  Laminectomy.  Reports continued stable chronic pain.  Discussed options.  Pt interested in Sports Medicine.  Defer imaging currently pending eval with Sports Medicine.  No current radicular sx / myelopathy.  -     Sports Medicine [employer] scientist)    Establishing care with new doctor, encounter for  -     Comprehensive Metabolic Panel Green Plasma Separator Tube; Future  -     CBC w/Auto Diff Lavender; Future  -     TSH, Blood Green Plasma Separator Tube; Future  -     Vitamin B12, Blood Green Plasma Separator Tube; Future  -     Vitamin D, 25-Hydroxy, Blood Yellow serum separator tube; Future  -     Mychart Access Code Procedure  -     Hepatits B Surface Antibody, Quant Yellow serum separator tube; Future    Parasomnia - With sexual behavior.  Reportedly extensively w/u in past. Establishing now.  Discussed options including Sleep Medicine, collaborative care, psychiatry.  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  Discussed clinic rules for controlled substances.  Return in 3 months for substance use agreement  -     clonazePAM (KLONOPIN) 1 MG tablet; Take 1 tablet (1 mg) by mouth 2 times daily. 1 mg po QHS and 0.'5mg'$  QAM    Acne, unspecified acne type - Longstanding.  Wishes to see derm.  -     Dermatology Clinic  -     Clindamycin Phos-Benzoyl Perox (ACANYA) 1.2-2.5 % GEL; Apply 1 bottle topically daily.    Hyperhidrosis - refill  -     propranolol (INDERAL) 60 MG tablet; Take 1 tablet (60 mg) by mouth daily.    Other orders  -     Cancel: Flu Vaccine, Preserv  Free, 3 years and older, IM      Patient Instructions   Sign up for MyChart  Meet with Sports Medicine    Get blood work, fasting    Come back in ~3 months for substance use agreement    Blood work will dictate cardiology evaluation        PLAN:  See above orders in EPIC.  Follow up will be scheduled as noted in instructions.  Patient verbalized understanding and agreement, and all questions were answered.    All of the patients questions were answered and patient verbalized understanding and agreement of the above plan   The past medical history, surgical history, family history, and social history were reviewed and updated   I have reviewed recent medical encounters, lab data, imaging data, and procedures       Medications at the end of this encounter:    Current Outpatient Prescriptions   Medication Sig Dispense Refill   . Clindamycin Phos-Benzoyl Perox (ACANYA) 1.2-2.5 % GEL Apply 1 bottle topically daily. 50 g 1   . clonazePAM (KLONOPIN) 1 MG tablet Take 1 tablet (  1 mg) by mouth 2 times daily. 1 mg po QHS and 0.'5mg'$  QAM 60 tablet 1   . doxyCYCLINE (VIBRAMYCIN) 100 MG capsule Take 100 mg by mouth 2 times daily.     . propranolol (INDERAL) 60 MG tablet Take 1 tablet (60 mg) by mouth daily. 60 tablet 1     No current facility-administered medications for this visit.

## 2016-02-07 ENCOUNTER — Telehealth (INDEPENDENT_AMBULATORY_CARE_PROVIDER_SITE_OTHER): Payer: Self-pay | Admitting: Internal Medicine

## 2016-02-07 DIAGNOSIS — L709 Acne, unspecified: Principal | ICD-10-CM

## 2016-02-07 MED ORDER — CLINDAMYCIN PHOS-BENZOYL PEROX 1.2-2.5 % EX GEL
1.0000 | Freq: Every day | CUTANEOUS | 1 refills | Status: DC
Start: 2016-02-07 — End: 2016-04-24

## 2016-02-07 NOTE — Telephone Encounter (Signed)
[place] [id number]    Clarification of the directions needed for this prescription as well please.    clonazePAM (KLONOPIN) 1 MG tablet (Order# [id number])

## 2016-02-07 NOTE — Telephone Encounter (Signed)
Daniel with [facility] is calling to advise that Acanya is not covered by [employer]. He would like to know if the doctor would like to change prescription to what is covered. He states Duac is covered. Please advise.

## 2016-02-07 NOTE — Telephone Encounter (Signed)
Pharmacy calling to clarify Sig for Clindamycin Phos-Benzoyl Perox (ACANYA) 1.2-2.5 % GEL  Sig: Apply 1 bottle topically daily    Routing to MD for advise.

## 2016-02-07 NOTE — Telephone Encounter (Signed)
Potential order for Duac in place if agreeable please sign pended order.

## 2016-02-07 NOTE — Telephone Encounter (Signed)
Rx updated for topical acne medication and resent.  Not clear about Klonapin, as is directed:    Patient Sig: Take 1 tablet (1 mg) by mouth 2 times daily. 1 mg po QHS and 0.5mg  QAM

## 2016-02-08 ENCOUNTER — Encounter (INDEPENDENT_AMBULATORY_CARE_PROVIDER_SITE_OTHER): Payer: Self-pay | Admitting: Internal Medicine

## 2016-02-08 MED ORDER — CLONAZEPAM 0.5 MG OR TABS
ORAL_TABLET | ORAL | 0 refills | Status: DC
Start: 2016-02-08 — End: 2016-05-16

## 2016-02-08 NOTE — Telephone Encounter (Signed)
Can you please ask Mr. Erik Burke if this substitution is ok?  We briefly discussed the option in clinic but he noted the Rx as was written works better.  Conversely, he could wait until he meets with dermatology if he wishes to see if they have insight given his history.

## 2016-02-08 NOTE — Addendum Note (Signed)
Addended by: [name] on: 02/08/2016 02:49 PM     Modules accepted: Orders

## 2016-02-09 NOTE — Telephone Encounter (Signed)
KeyKellie Moor) [id number]  Acanya 1.2-2.5% gel  Status: Sent to Plan  Created: October 24th, 2017   Awaiting response from pts insurance, if they deny PA I will call pt in regards to substitution.

## 2016-02-10 ENCOUNTER — Telehealth (INDEPENDENT_AMBULATORY_CARE_PROVIDER_SITE_OTHER): Payer: Self-pay | Admitting: Internal Medicine

## 2016-02-10 NOTE — Telephone Encounter (Signed)
Order was already confirmed by MD for clonazepam will call pharmacy to ensure they received updated order. Also PA is currenly in process for [facility] awaiting results as first one was denied and I have submitted an appeal (seperate encounter for this)    Also no hx of ER in chart, routing to MD to confirm if pt should be taking ER.

## 2016-02-10 NOTE — Telephone Encounter (Signed)
Pt says that Rx for propranolol (INDERAL) 60 MG tablet is supposed to be extended release.  Pt says that his records should have indicated this.  Please advise him if this is updated at his pharmacy.   Pt says that pharmacy needs the ok to fill generic for Clindamycin Phos-Benzoyl Perox (ACANYA) - he explains to get away with this the percent of dose needs to change in order to get generic version.   Also pharmacy had confusion with Rx clonazePAM (KLONOPIN).     Please contact [facility] to discuss.      [facility] # [place]

## 2016-02-10 NOTE — Telephone Encounter (Signed)
Called pts insurance and had PA approved. Pharmacy informed via insurance co.

## 2016-02-11 ENCOUNTER — Other Ambulatory Visit (INDEPENDENT_AMBULATORY_CARE_PROVIDER_SITE_OTHER): Payer: Commercial Managed Care - HMO | Attending: Internal Medicine

## 2016-02-11 DIAGNOSIS — Z7689 Persons encountering health services in other specified circumstances: Principal | ICD-10-CM | POA: Insufficient documentation

## 2016-02-11 DIAGNOSIS — E785 Hyperlipidemia, unspecified: Secondary | ICD-10-CM | POA: Insufficient documentation

## 2016-02-11 LAB — COMPREHENSIVE METABOLIC PANEL, BLOOD
ALT (SGPT): 21 U/L (ref 0–41)
AST (SGOT): 19 U/L (ref 0–40)
Albumin: 4.8 g/dL (ref 3.5–5.2)
Alkaline Phos: 61 U/L (ref 40–129)
Anion Gap: 12 mmol/L (ref 7–15)
BUN: 10 mg/dL (ref 6–20)
Bicarbonate: 28 mmol/L (ref 22–29)
Bilirubin, Tot: 0.59 mg/dL (ref <1.2)
Calcium: 9.7 mg/dL (ref 8.5–10.6)
Chloride: 102 mmol/L (ref 98–107)
Creatinine: 1.13 mg/dL (ref 0.67–1.17)
GFR: >60 mL/min
Glucose: 93 mg/dL (ref 70–99)
Potassium: 4.1 mmol/L (ref 3.5–5.1)
Sodium: 142 mmol/L (ref 136–145)
Total Protein: 7.3 g/dL (ref 6.0–8.0)

## 2016-02-11 LAB — CBC WITH DIFF, BLOOD
ANC-Automated: 1.8 10*3/uL (ref 1.6–7.0)
Abs Eosinophils: 0.3 10*3/uL (ref 0.1–0.5)
Abs Lymphs: 1.8 10*3/uL (ref 0.8–3.1)
Abs Monos: 0.5 10*3/uL (ref 0.2–0.8)
Basophils: 1 %
Eosinophils: 6 %
Hct: 44.9 % (ref 40.0–50.0)
Hgb: 15.5 gm/dL (ref 13.7–17.5)
Lymphocytes: 42 %
MCH: 30.8 pg (ref 26.0–32.0)
MCHC: 34.5 g/dL (ref 32.0–36.0)
MCV: 89.1 um3 (ref 79.0–95.0)
MPV: 9.3 fL — ABNORMAL LOW (ref 9.4–12.4)
Monocytes: 10 %
Plt Count: 245 10*3/uL (ref 140–370)
RBC: 5.04 10*6/uL (ref 4.60–6.10)
RDW: 11.5 % — ABNORMAL LOW (ref 12.0–14.0)
Segs: 41 %
WBC: 4.4 10*3/uL (ref 4.0–10.0)

## 2016-02-11 LAB — TSH, BLOOD: TSH: 1.16 u[IU]/mL (ref 0.27–4.20)

## 2016-02-11 LAB — HEPATITIS B SURFACE AB, QUANT, BLOOD: HBsAb,Qt: 1496 m[IU]/mL

## 2016-02-12 LAB — LIPID(CHOL FRACT) PANEL, BLOOD
Cholesterol: 231 mg/dL (ref <200)
HDL-Cholesterol: 37 mg/dL
LDL-Chol (Calc): 150 mg/dL (ref <160)
Non-HDL Cholesterol: 194 mg/dL
Triglycerides: 221 mg/dL — ABNORMAL HIGH (ref 10–170)

## 2016-02-12 LAB — VITAMIN B12, BLOOD: Vitamin B12: 361 pg/mL (ref 211–946)

## 2016-02-13 ENCOUNTER — Other Ambulatory Visit (INDEPENDENT_AMBULATORY_CARE_PROVIDER_SITE_OTHER): Payer: Self-pay | Admitting: Internal Medicine

## 2016-02-13 NOTE — Telephone Encounter (Signed)
Received Fax PA approval of RX medication. MR # placed on documents, document placed in MD's bin for review, and will be scanned into clients chart thereafter.

## 2016-02-13 NOTE — Telephone Encounter (Signed)
Received PA approval that RX is covered. Document placed for MR to be scanned into chart.

## 2016-02-14 ENCOUNTER — Encounter (INDEPENDENT_AMBULATORY_CARE_PROVIDER_SITE_OTHER): Payer: Self-pay | Admitting: Internal Medicine

## 2016-02-14 LAB — VITAMIN D, 25-OH TOTAL
Vitamin D, 25-OH D2: <5 ng/mL
Vitamin D, 25-OH D3: 22 ng/mL
Vitamin D, 25-OH TOTAL: 22 ng/mL — ABNORMAL LOW (ref 30–80)

## 2016-02-15 ENCOUNTER — Encounter (INDEPENDENT_AMBULATORY_CARE_PROVIDER_SITE_OTHER): Payer: Self-pay | Admitting: Dermatology

## 2016-02-15 ENCOUNTER — Ambulatory Visit (INDEPENDENT_AMBULATORY_CARE_PROVIDER_SITE_OTHER): Payer: Commercial Managed Care - HMO | Admitting: Dermatology

## 2016-02-15 DIAGNOSIS — L700 Acne vulgaris: Secondary | ICD-10-CM

## 2016-02-15 DIAGNOSIS — L905 Scar conditions and fibrosis of skin: Secondary | ICD-10-CM

## 2016-02-15 DIAGNOSIS — D229 Melanocytic nevi, unspecified: Secondary | ICD-10-CM

## 2016-02-15 DIAGNOSIS — L810 Postinflammatory hyperpigmentation: Secondary | ICD-10-CM

## 2016-02-15 MED ORDER — TRETINOIN 0.025 % EX CREA
TOPICAL_CREAM | CUTANEOUS | 3 refills | Status: DC
Start: 2016-02-15 — End: 2018-02-17

## 2016-02-15 NOTE — Progress Notes (Signed)
Primary MD: [name]  Consult Requested By: [name]    HISTORY:  Erik Burke is a 35 year old male with no history of skin cancer who presents for management of acne.    LOV: new to [facility]  Biopsies: prior biopsies of moles; per patient, they were atypical, but not malignant  Treatments/diagnoses: none    Pt complains of acne on the face  Primarily forehead, cheeks  Occasional flares, about 2x yearly  Controlled with PRN use of clindamycin + BPO combined topical BID as well as doxycycline 100 mg PO BID PRN, which seems to help during these symptoms.    Many years ago, he used Accutane, with good results.    Unknown exacerbents  He is now transferring care to [place]    Additionally, there are no symptomatic or changing moles.      Patient reports intermittent use of sunscreen.  Frequent direct sun exposure.       Past Medical History:  The patient's dermatologic medical history is negative for melanoma, negative for NMSC    The patient's dermatologic family history is negative for melanoma, and negative for other skin disease.      The patient  has a past medical history of Acne; Back pain; Hyperhidrosis; Hyperlipidemia; and Parasomnia associated with sexual behavior.     The medications were reviewed.    Allergies: Review of patient's allergies indicates no known allergies.    REVIEW OF SYSTEMS:  CONSTITUTIONAL: negative for fever or chills  DERMATOLOGIC: Feels well, no other skin complaints.    PHYSICAL EXAM:  Skin Type:                    2  General Appearance: within normal limits  Neuro: Alert and oriented x 3  Psych: Mood and affect within normal limits  Eyes: Inspection of lids and conjunctiva within normal limits  Cardiovascular: Inspection of peripheral vascular system showed no swelling, edema. Extremities warm, well-perfused.   Extremities: inspection of digits and nails wnl except if noted below     Skin: The areas examined included scalp, face, oropharynx, neck, RUE, LUE,  chest, abdomen, back  All was normal except for the following:    - Forehead, cheeks, there are scattered comedones  - on areas above, there are hyperpigmented macules    - Multiple brown, <5mm, smooth bordered macules and papules scattered over body    - Well healed scars without nodularity or pigmentation      ASSESSMENT AND TREATMENT PLAN:      Acne:   Type: primarily comedonal, with mild inflammatory involvement  Severity: moderate  Location: face  Scarring: no/mild scarring.  Prior treatments: Accutane years ago, now clindamycin/BPO topical PRN and doxy PO PRN    - The pathophysiology of acne is an inflammatory, pustular and comedonal skin disease under some hormonal influence.    - Various treatment options exist, including topical and topical antibiotics, oral antibiotics, topical retinoids, OCPs and other hormonal treatment, and oral isotretinoin.    - After discussing various options above, we decided to start topical retinoids    Recommendations:  - We discussed the importance of gentle skin care and to avoid of picking  -Use only NON-COMEDOGENIC products on your skin  -Moisturize with a non-comedogenic moisturizer such as Cetaphil cream as well as regular use of a broad spectrum sunblock with SPF 30+ (consider products by Neutrogena, Eucerin or Cetaphil).    -Wash in the morning with  a benzoyl peroxide containing product such as Neutrogena Clear Pore.  -Wash face with gentle wash such as Cetaphil liquid cleanser for routine cleansing at night    -Apply pea-sized amount of tretinoin 0.025% once nightly to entire face.  This may cause dryness, redness, and irritation, especially when starting treatment.  For the first few weeks, start on an every-other night basis, and gradually increase over a few weeks to nightly use. Use in combination with moisturizer, as above. This product has not been demonstrated to be safe to use in pregnancy.    - Pt will call if he wants doxycycline PO in addition to the above  regimen  - RTC 3 mos for recheck      Post-inflammatory hyperpigmentation  - Benign, temporary changes 2/2 acne. No treatment indicated at this time  - Care as above       Benign Nevi: none clinically concerning for malignancy on exam today  - Pt instructed to return to clinic if any changes in color, size shape  - Reviewed ABCDE's of melanoma, handout given  - Advised pt on sun protection  - Return for regular skin examinations      Scar  - sites of prior biopsies  - Given patient's statement that these were benign, there is no indicated treatment at this time              Note written by: Judeth Cornfield. Gwynneth Munson, MD, PhD

## 2016-02-15 NOTE — Patient Instructions (Signed)
Acne:     Recommendations:  - We discussed the importance of gentle skin care   -Use only NON-COMEDOGENIC products on your skin  -Moisturize with a non-comedogenic moisturizer such as Cetaphil cream as well as regular use of a broad spectrum sunblock with SPF 30+ (consider products by Neutrogena, Eucerin or Cetaphil).    -Wash in the morning with a benzoyl peroxide containing product such as Neutrogena Clear Pore.  -Wash face with gentle wash such as Cetaphil liquid cleanser for routine cleansing at night    -Apply pea-sized amount of 0.025% once nightly to entire face.  This may cause dryness, redness, and irritation, especially when starting treatment.  For the first few weeks, start on an every-other night basis, and gradually increase over a few weeks to nightly use. Use in combination with moisturizer, as above. This product has not been demonstrated to be safe to use in pregnancy.      SUNSCREENS:    You should wear sunscreen every day, as part of your daily routine.    On face, recommend daily use of a moisturizer that also has SPF 30 or higher. Neutrogena and Eucerin both make products that are very light and easy to apply.    On arms and other sun-exposed skin, use sunscreen/moisturizer at least SPF 50 each day. Sunscreens with zinc oxide or titanium dioxide provide broader spectrum coverage. Sunscreen needs to be reapplied every 1.5 to 2 hours when you are outside.     For acne prone skin:  Sunscreen should say NON-COMEDOGENIC on it.      If you will be in the sun for an extended period of time, we recommend wearing sun protective clothing. "Sun precautions" online website has clothing w/ protection factor. "[employer]ovette [employer]" is another company with protective clothing. [employer] on [place] may add protection factor to clothes.     Remember to avoid sun during the peak hours of 10 am - 2 pm.  Wear a wide brimmed hat when outside.  Wear sunglasses that will cover your entire eye and upper cheeks if possible.        Remember the ABCDEs and contact our office if any of the following are observed in your moles:  A-Asymmetry  B-Borders becoming irregular  C-Color is no longer uniform  D-Diameter greater than 87mm (about the size of pencil eraser)  E-Evolving-if your lesion undergoes changes and it evolves into a different appearing lesion  -Ask your hair stylist to alert you if any suspicious lesions are noticed on scalp while getting your hair cut

## 2016-02-16 ENCOUNTER — Encounter (INDEPENDENT_AMBULATORY_CARE_PROVIDER_SITE_OTHER): Payer: Self-pay | Admitting: Internal Medicine

## 2016-02-16 DIAGNOSIS — R61 Generalized hyperhidrosis: Principal | ICD-10-CM

## 2016-02-16 NOTE — Telephone Encounter (Signed)
From: [name]  To: [name], MD  Sent: 02/14/2016 9:30 PM PDT  Subject: 1-Non Urgent Medical Advice    Hello Dr. Selinda Eon,    Thank you for the interpretation of the blood test results. I'll look into Vit D supplements. Let me know if you have a suggestion for brand, type (e.g., D2, D3), and mode (e.g. liquid, capsule, tablet). Also, per lipid panel results, definitely seems like something to keep an eye on. I'm open to speaking with a cardiologist; whichever you recommend.     Best,  Erik Burke

## 2016-02-16 NOTE — Telephone Encounter (Signed)
Message/encounter forwarded to the pcp.

## 2016-02-20 MED ORDER — PROPRANOLOL HCL CR 60 MG OR CP24
60.0000 mg | ORAL_CAPSULE | Freq: Every day | ORAL | 1 refills | Status: DC
Start: 2016-02-20 — End: 2016-05-16

## 2016-02-20 NOTE — Telephone Encounter (Signed)
Per pt:    Sounds good! Much thanks. I'll look into some [facility] Vitamin D.     By the way, the propranolol should be 60 mg Extended Release capsule. Right now it's listed in the chart (and pharmacy erroneously has it in their system) as 60 MG tablet.     Have a great weekend,   [name]

## 2016-02-25 ENCOUNTER — Encounter (INDEPENDENT_AMBULATORY_CARE_PROVIDER_SITE_OTHER): Payer: Self-pay | Admitting: Dermatology

## 2016-02-27 NOTE — Telephone Encounter (Signed)
From: [name]  To: Delbert Phenix  Sent: 02/25/2016 10:22 AM PST  Subject: 1-Non Urgent Medical Advice    Hi Dr. Gwynneth Munson,    When do you recommend using Neutrogena (bezoyl peroxide) and Cetaphil? I remember you indicating one in the AM, and the other in the PM, but I threw away that paperwork.    Thank you,  [name]

## 2016-03-05 ENCOUNTER — Encounter (INDEPENDENT_AMBULATORY_CARE_PROVIDER_SITE_OTHER): Payer: Self-pay | Admitting: Orthopaedic Surgery

## 2016-03-05 ENCOUNTER — Inpatient Hospital Stay (INDEPENDENT_AMBULATORY_CARE_PROVIDER_SITE_OTHER)
Admit: 2016-03-05 | Discharge: 2016-03-05 | Disposition: A | Discharge status: Home Health | Payer: Commercial Managed Care - HMO | Attending: Orthopaedic Surgery | Admitting: Orthopaedic Surgery

## 2016-03-05 ENCOUNTER — Encounter (HOSPITAL_BASED_OUTPATIENT_CLINIC_OR_DEPARTMENT_OTHER): Payer: Self-pay | Admitting: Orthopaedic Surgery

## 2016-03-05 ENCOUNTER — Ambulatory Visit (INDEPENDENT_AMBULATORY_CARE_PROVIDER_SITE_OTHER): Payer: Commercial Managed Care - HMO | Admitting: Orthopaedic Surgery

## 2016-03-05 ENCOUNTER — Encounter (INDEPENDENT_AMBULATORY_CARE_PROVIDER_SITE_OTHER): Payer: Self-pay

## 2016-03-05 ENCOUNTER — Other Ambulatory Visit (INDEPENDENT_AMBULATORY_CARE_PROVIDER_SITE_OTHER): Payer: Self-pay | Admitting: Orthopaedic Surgery

## 2016-03-05 VITALS — BP 121/77 | HR 76 | Ht 68.0 in | Wt 170.0 lb

## 2016-03-05 DIAGNOSIS — Z9889 Other specified postprocedural states: Secondary | ICD-10-CM

## 2016-03-05 DIAGNOSIS — M539 Dorsopathy, unspecified: Principal | ICD-10-CM

## 2016-03-05 DIAGNOSIS — M5136 Other intervertebral disc degeneration, lumbar region: Secondary | ICD-10-CM

## 2016-03-05 NOTE — Progress Notes (Signed)
Attending Note:   Dr. Selinda Eon, Lanetta Inch requests Orthopaedic Surgical consultation for the patient Erik Burke for problems related to the   Chief Complaint   Patient presents with   . Back Pain     My report follows:    Subjective:   I reviewed the history and medical record, received and reviewed the H&P intake form, and interviewed and examined the patient.    History of present illness (HPI): 35 year old male here for Orthopaedic Surgical evaluation to treat the pain, history of multiple injections and surgeries to his back. One year ago, a different symptoms, pain located mostly about the tailbone/sacral area with pain also in the hamstrings, 3-6/10. Could last up to a week every other month, can be severe and debilitating, worse on moving or sitting for a long period of time, relieved by walking. MEDS tylenol/ibuprofen usually taken, not helpful. Occasionally notes difficulties with ADLs such as putting pants on. He reports 70% in sacral region, 30% in upper hamstrings. Spontaneous onset of pain a year ago.    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 Reports a huge difference with ESIs. Reports iliac crest fracture in 2001 from soccer. Had PT after lami, none recently. Imaging - Ct myelogram 2013. EMG/NCS done 2017. No reports available to review.    He reports that his Neuro-radiologist at [facility] (Dr Iva Lento) was insistent that he get future ESIs done only by another Neuro-radiologist, advises a former fellow now practicing in [place]. He also reports his MRI LS SPINE imaging reports were relatively mild compared to the intra-operative findings during spine surgery at [facility], and that he has a discrepancy between imaging and actual anatomy.    I reviewed the past  medical history/problem list, allergies, medications, family history and social history from the electronic medical record, and on the intake paperwork:  Patient Active Problem List   Diagnosis   . Hyperlipidemia   . Back pain   . Parasomnia   . Acne, unspecified acne type   . Hyperhidrosis     No Known Allergies  Current Outpatient Prescriptions   Medication Sig Dispense Refill   . Clindamycin Phos-Benzoyl Perox (ACANYA) 1.2-2.5 % GEL Apply 1 Application topically daily. Thin application to affected skin 50 g 1   . clonazePAM (KLONOPIN) 0.5 MG tablet 1 mg po QHS (2 tabs) and 0.5mg  QAM (1 tab) 90 tablet 0   . doxyCYCLINE (VIBRAMYCIN) 100 MG capsule Take 100 mg by mouth 2 times daily.     . propranolol (INDERAL LA) 60 MG CP24 Take 1 capsule (60 mg) by mouth daily. 90 each 1   . tretinoin (RETIN-A) 0.025 % cream Use a pea sized amount on the entire face nightly.  At first, you may use every other night to avoid dryness and irritation, and gradually increase use to nightly as tolerated 45 g 3     No current facility-administered medications for this visit.      Family History   Problem Relation Age of Onset   . Cancer Mother      cervical   . No Known Problems Father    . Cancer Maternal Grandmother      ovarian   . No Known Cancer Maternal Grandfather    .  Heart Disease Maternal Grandfather    . Cancer Paternal Grandmother      pancreatic   . Cancer Paternal Grandfather      mesothelioma   . Cancer Brother 17     osteosarcoma   No hereditary or high-risk disease identified otherwise.    Social History   Substance Use Topics   . Smoking status: Never Smoker   . Smokeless tobacco: Never Used   . Alcohol use Yes      Comment: 1-3 drinks infrequently     Review of Systems: as indicated in the history of present illness and per the intake form. All others reviewed and were negative.    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 In the meanwhile, here are management suggestions we discussed:    1. Have your PCP initiate a consultation with     Cardiology    2. Consultation initiated with [facility].   [place] for [place], [place]  [place], [place] and [phone] telephone  [phone] fax   - Patient Information  - New Patient Information  - New Patient Questionnaire  - Administrative Contacts  - Faculty    [email or web address]    3. Obtain prior imaging reports and images, EMG report, for [place] to review    4. Xray updates today    5. Alternatively consider this contact with Neuroradiology:    Theresia Majors- MD  [email or web address]    F/u in 6 weeks    More than half of the 45 minute clinic appointment time was spent face-to-face with the patient to provide education, counseling and coordination of care. We discussed issues as described above. All of the patient's questions were answered to their satisfaction.    Macon Large, M.D., Ph.D.  Clinical Professor, [place] Department of Orthopaedic Surgery  Chief, Physical Medicine and Rehabilitation   [place] [employer]  Team medical consultant  [place] medical consultant    CC: [name]

## 2016-03-05 NOTE — Patient Instructions (Addendum)
It was a pleasure seeing you today! You were seen today by Dr. Radene Knee. Today, we discussed:     1. Have your PCP initiate a consultation with     Cardiology    2. Consultation initiated with [facility]. Consider:  [place] for [place], [place]  [place], [place] and [phone] telephone  [phone] fax   - Patient Information  - New Patient Information  - New Patient Questionnaire  - Administrative Contacts  - Faculty    [email or web address]    3. Obtain prior imaging reports and images, EMG report, for [place] to review    4. Xray updates today    5. Alternatively consider this contact with Neuroradiology:    Theresia Majors- MD  [email or web address]      Take care!  Macon Large, MD PhD

## 2016-04-03 ENCOUNTER — Encounter (INDEPENDENT_AMBULATORY_CARE_PROVIDER_SITE_OTHER): Payer: Self-pay | Admitting: Pain Medicine

## 2016-04-03 ENCOUNTER — Ambulatory Visit (INDEPENDENT_AMBULATORY_CARE_PROVIDER_SITE_OTHER): Payer: Commercial Managed Care - HMO | Admitting: Pain Medicine

## 2016-04-03 VITALS — BP 122/78 | HR 82 | Temp 97.8°F | Resp 16

## 2016-04-03 DIAGNOSIS — M961 Postlaminectomy syndrome, not elsewhere classified: Secondary | ICD-10-CM

## 2016-04-03 DIAGNOSIS — G8929 Other chronic pain: Principal | ICD-10-CM

## 2016-04-03 DIAGNOSIS — M533 Sacrococcygeal disorders, not elsewhere classified: Principal | ICD-10-CM

## 2016-04-03 DIAGNOSIS — G9611 Dural tear: Secondary | ICD-10-CM

## 2016-04-03 MED ORDER — LIDOCAINE HCL 4 % EX SOLN
CUTANEOUS | 0 refills | Status: DC
Start: 2016-04-03 — End: 2016-07-20

## 2016-04-03 NOTE — Progress Notes (Signed)
Attending Note:  I have interviewed and examined the patient at [facility], and I have discussed my findings and recommendations with the patient and the resident. I have reviewed and agree with the resident's note including the history, medications, and physical examination.  I concur with the resident's assessment and plan. Please see the resident's note for further details.    Chief Complaint:    Chief Complaint   Patient presents with   . Pain       Diagnosis:    ICD-10-CM ICD-9-CM    1. 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 Postlaminectomy syndrome of lumbar region M96.1 722.83        Plan:  1.  Recommend physical therapy.  Pt states he will proceed but has concerns about issues with prior spontaneous dural leaks.  Would recommend caution increasing intrathecal pressures during treatments.    2.  Do not think that current pain is related to prior lumbar surgery.  Pt has some radicular pain but not associated with current pain.  Do not think pain is related to piriformis or SI joint either.    3.  Would consider anticonvulsant or antidepressant for neuropathic pain in future.  Pt currently reluctant to start this now.    4.  Will try topical lidocaine as above.    5. May consider coccygeal ligament injection in future.    6..  More than 45 min was spent with the patient today, of which more than 50% was spent in counseling regarding the diagnosis, prognosis, further diagnostic and therapeutic options and coordinating this care.  There were no barriers to learning.

## 2016-04-03 NOTE — Progress Notes (Signed)
PAIN NEW CONSULT NOTE    Referring Physician Denzil Hughes  Primary Care Physician [name]    History of Illness:  This is a 35 year old male with a chief complaint of buttock pain. This patient was seen for a pain consultation requested by Denzil Hughes.     The patient reports this pain has been present for the 6 months. It is located midline in the area of his coccyx and sacrum and radiates to the bilateral buttock. This pain occurs once every one to two weeks. Pain is worse with sitting. He describes this pain as burning pressure-like pain "like hot liquid." He denies any known injury to the coccyx.     This pain is distinctly different than other low back pains that he has had in the past. (He  reports chronic low back pain off and on for the past 15 years. He sustained an acute disc herniation in 2001 from high school soccer injury and underwent laminectomy at L3-4 at [facility] for low back and right lateral leg pain extending to the toes. The laminectomy completely resolved this pain. More recently he underwent ESI in April for back pain that radiated to the posterior calf.)    The has a few other notable aspects of his medical history including spontaneous dural tears in 2012 which did not respond to blood patch and required fibrin blood patch to treat. 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 (He was placed on a statin for familial hypercholesterolemia).    Current Description of Symptoms:  Patient stated their pain today is 3-6/10. On the pain diagram today the patient shades in the areas of their buttock.  They describe their pain as intermittent pressure, aching, radiating, tender, burning, stinging, squeezing, sharp, splitting, shooting, stabbing, pinching and dull. Patient states their pain is associated with tightness. This pain has made it hard for the patient to walk, sleep, sit, work, exercise and enjoy  life. Patient states pain is unpredictable.     Over the last 7 days the patient's pain has been at its worst 8/10, at best 2/10 and averages 3-6/10.     Significant PMH/PSH: Clinical Psychology Fellow  Past Medical History:   Diagnosis Date   . Acne    . Back pain     secondary to herniated disc, from back injury in 2000   . Hyperhidrosis     propanolol tx   . Hyperlipidemia     LDL > 200, TG ~400 at Dx, statin intolerant.  Per pt, r/o for familial HDL   . Parasomnia associated with sexual behavior     Tx with Caprice Renshaw, has been being downtitrated     Past Surgical History:   Procedure Laterality Date   . epidural steroid injection      x5 ESIs, most ecent 2017   . l3/4 laminectomy  2014    @ [facility], 2014     Social History     Social History   . Marital status: Married     Spouse name: N/A   . Number of children: N/A   . Years of education: N/A     Occupational History   . Not on file.     Social History Main Topics   . Smoking status: Never Smoker   . Smokeless tobacco: Never Used   . Alcohol use Yes      Comment: 1-3 drinks infrequently   .  Drug use: Not on file   . Sexual activity: Not on file     Other Topics Concern   . Not on file     Social History Narrative    PhD in Psychology, works in [employer], [place] [employer]     Additional Social History:   Currently working/school: yes - Clinical Psych fellow at [employer] legal case related to pain: no  Alcohol or substance abuse/use: no  History of DUI: no. History of alcohol/substance abuse treatment: no  Current exercise: yes - gym 5 d per week  History of childhood sexual abuse: no  History of Depression/Anxiety/Mental Illness: no    Family History   Problem Relation Age of Onset   . Cancer Mother      cervical   . No Known Problems Father    . Cancer Maternal Grandmother      ovarian   . No Known Cancer Maternal Grandfather    . Heart Disease Maternal Grandfather    . Cancer Paternal Grandmother      pancreatic   . Cancer Paternal Grandfather      mesothelioma   .  Cancer Brother 64     osteosarcoma     Additional Family History:  Family history of Alcoholism: no  Family history of Substance Abuse: no    Review of Systems:   General: Negative  EENT: Negative  Respiratory: Negative  Cardiovascular: Negative  Skin: Negative  Gastrointestinal: Negative  Endocrine: Negative  Musculoskeletal: Joint pain/swelling and Muscle pain  Neuro: negative  Psychiatry: Negative  Genito/Reproductive: Negative  Urinary: Negative  Remainder of a 12 point review of systems is negative    Diagnostic History:   Patient has had the following tests to evaluate their pain   MRI Lumbar Spine (Outside) 04/2015:  Degenerative changed at L2-4 with minimal disc bulges >L4-S1 with minimal to mild facet hypertrophy. Mild effacement ventral thecal sack L2-4. No large focal disc extrusions, significant central stenosis, cord signal or cauda equina abornormalities.    Lumbosacral X-Ray 03/05/16 FINDINGS:  There are 5 lumbar type vertebral bodies. There is mild multilevel degenerative disc disease from L1 through L4.    Vertebral body height is maintained. There is no fracture. There is no abnormal motion on the lateral flexion-extension views.    Radiopaque coils are noted along the left aspect of the L4 vertebral body. Radiopaque surgical tacks project over the left aspect of the pelvis.    The sacrum and sacroiliac joints are normal. The pubic symphysis and pubic rami are normal. Both hips are unremarkable.     Therapeutic History:   Patient has seen other pain providers to treat the current problem.   Prior interventional pain procedures include: MultipleCT[facility]guided with Iva Lento at [facility])  Non-interventional pain treatments include: PT and Chiropractic  Patient has tried the following pain medications: ibuprofen and tylenol    Current Pain Medications:   PRN ibuprofen or APAP  Clonazepam 0.5 mg QHS    Current Outpatient Prescriptions   Medication Sig Dispense Refill   . Clindamycin  Phos-Benzoyl Perox (ACANYA) 1.2-2.5 % GEL Apply 1 Application topically daily. Thin application to affected skin 50 g 1   . clonazePAM (KLONOPIN) 0.5 MG tablet 1 mg po QHS (2 tabs) and 0.5mg  QAM (1 tab) 90 tablet 0   . doxyCYCLINE (VIBRAMYCIN) 100 MG capsule Take 100 mg by mouth 2 times daily.     Marland Kitchen lidocaine (XYLOCAINE) 4 % external solution Apply to  affected area as needed 1 bottle 0   . propranolol (INDERAL LA) 60 MG CP24 Take 1 capsule (60 mg) by mouth daily. 90 each 1   . tretinoin (RETIN-A) 0.025 % cream Use a pea sized amount on the entire face nightly.  At first, you may use every other night to avoid dryness and irritation, and gradually increase use to nightly as tolerated 45 g 3     No current facility-administered medications for this visit.        Patient is not currentlyon any anticoagulation medications    No Known Allergies    Physical Exam:   Physical Exam  Vitals: BP 122/78 (BP Location: Right arm, BP Patient Position: Sitting, BP cuff size: Regular)  Pulse 82  Temp 97.8 F (36.6 C) (Oral)  Resp 16  General: healthy, no distress, cooperative  Mental Status:  alert, oriented x 3. Speech is clear/ normal  Affect: euthymic  Skin: Skin color, texture, turgor normal. No rashes or lesions.  HEENT:  Pupils equal, not pinpoint.  Pulmonary:  Breathing easily without tachypnea or bradypnea.  Cardiac:  No LE edema.  Abdomen: Abdomen soft, non-tender. BS normal. No masses, organomegaly  Ambulation: Pt is able to raise from a seated position without difficulty. Gait is not antalgic and the patient ambulates without assistance.   Neuro/Musculoskeletal:   L-Spine    Flexion (normal 45):   full without pain  Extension (normal 25):   full without pain  Lateral Flexion Right (normal 25): full without pain  Lateral Flexion Left (normal 25): full without pain  Extension-Rotation Right:   full without pain  Extension-Rotation Left:   full without pain  Facet Palpation: Right: non-tender; Left:  non-tender  Straight Leg Raise: Right negative; Left negative        Sacroiliac Joint   Palpation: Right: non-tender; Left: non-tender  Compression Test: Right negative; Left negative  Fortin Finger Test: Right negative; Left negative  Patrick's Test (Flexion Abduction Exter Rot): Right negative; Left negative    Coccyx  TTP mildly    Lower-Extremities  Hips  (flex 100 ext 30 ab 40 ad 20 ir 40 er 45): full without pain  Knees  (flex 130): full without pain  Trochanteric bursa:  Right non-tender; Left non-tender  Motor Strength  Iliopsoas:  5 bilaterally  Quadriceps:  5 bilaterally  Hamstrings:    5 bilaterally  Ankle Dorsi-flexion:   5 bilaterally  Ankle Planar-flexion:  5 bilaterally  Ext-hallucis longus 5 bilaterally  Reflexes  Sensory Exam  Light touch: intact  Cold: intact  Allodynia: absent    Diagnosis  Encounter Diagnoses   Name Primary?   . Coccygeal pain, chronic Yes   . Postlaminectomy syndrome of lumbar region        Assessment  This is a 35 year old male with coccygeal pain. His medical history is notable for prior laminectomy at L3-4 as well as idiopathic dural tears.     The patient's Opioid Risk Tool score of 1 places them into the low risk category due to age.    We ran the patient's name and date-of-birth through the [facility] Department of [place] website which will list controlled drug prescriptions filled at [facility] in the past 12 months. The information in the patient's CURES report is consistent with their self-report and shows no evidence of opioid abuse or doctor shopping.    PLAN  - We will place an order for physical therapy to address  coccygeal pain  - Will order lidocaine ointment for coccygeal pain    Depending on response to these treatments we may consider  - Sacrococcygeal ligament injection  - Trial of Nortriptyline or Amitriptyline    We also recommend the patient start a low impact exercise program such as aqua  therapy or recumbent bike as tolerated to improve cardiovascular function, core strength, and flexibility.     Follow-up: 1-2 months    Thank you for the consultation, please call with any questions.

## 2016-04-04 ENCOUNTER — Telehealth (INDEPENDENT_AMBULATORY_CARE_PROVIDER_SITE_OTHER): Payer: Self-pay | Admitting: Pain Medicine

## 2016-04-04 NOTE — Telephone Encounter (Signed)
Clarified sig w Rx

## 2016-04-04 NOTE — Telephone Encounter (Signed)
Traci Sermon is requesting a call back to confirm frequency for lidocaine (XYLOCAINE) 4 % external solution.     Please advise.

## 2016-04-05 ENCOUNTER — Encounter (INDEPENDENT_AMBULATORY_CARE_PROVIDER_SITE_OTHER): Payer: Self-pay

## 2016-04-24 ENCOUNTER — Ambulatory Visit (INDEPENDENT_AMBULATORY_CARE_PROVIDER_SITE_OTHER): Payer: Commercial Managed Care - HMO | Admitting: Orthopaedic Surgery

## 2016-04-24 ENCOUNTER — Encounter (HOSPITAL_BASED_OUTPATIENT_CLINIC_OR_DEPARTMENT_OTHER): Payer: Self-pay

## 2016-04-24 ENCOUNTER — Encounter (INDEPENDENT_AMBULATORY_CARE_PROVIDER_SITE_OTHER): Payer: Self-pay | Admitting: Orthopaedic Surgery

## 2016-04-24 VITALS — BP 134/75 | HR 75 | Temp 97.3°F | Ht 68.0 in | Wt 170.0 lb

## 2016-04-24 DIAGNOSIS — M48062 Spinal stenosis, lumbar region with neurogenic claudication: Principal | ICD-10-CM

## 2016-04-24 NOTE — Progress Notes (Signed)
ORTHOPAEDIC PHYSICAL MEDICINE ESTABLISHED PATIENT VISIT   LOCATION: [facility]  PCP: [name]  Reason For Visit: had concerns including Recheck.    History Of Present Illness: 36 year old male complains of back pain. Patient rates his pain as 4.     Last Clinic Visit: 03-05-16 for sacral pain. No identifiable ortho spine surgical pathology identified at the last visit. Plan followup with Cardiology, Pain service, obtain prior medical reports, update spine imaging with xrays, consider neuroradiology (based on his previous history).    He saw the pain service Dr Trinna Post 04-03-16, plan PT, lido topical, trial med (pamelor or elavil).    Pain is worsened, shooting pain in the right buttocks and hamstring, occasionally down to the knee. He reports changed pain in December 2017 with pulsating pain every 3 seconds x 3 hours, up to 9/10 in severity. Keeps him up at night. In the past he is accustomed to 7-10 days of episodic pain chronically. Last ESI done ~ summer 2017 at [facility] with about 2 mos of relief, previous ones provided ~3-4 mos of relief done at [facility] in 2013-14.    He does not have his previous EMG/NCS report, but he reports it was within normal limits.       Current Outpatient Prescriptions:     clonazePAM (KLONOPIN) 0.5 MG tablet, 1 mg po QHS (2 tabs) and 0.5mg  QAM (1 tab), Disp: 90 tablet, Rfl: 0    lidocaine (XYLOCAINE) 4 % external solution, Apply to affected area as needed, Disp: 1 bottle, Rfl: 0    propranolol (INDERAL LA) 60 MG CP24, Take 1 capsule (60 mg) by mouth daily., Disp: 90 each, Rfl: 1    tretinoin (RETIN-A) 0.025 % cream, Use a pea sized amount on the entire face nightly.  At first, you may use every other night to avoid dryness and irritation, and gradually increase use to nightly as tolerated, Disp: 45 g, Rfl: 3    Vitals: BP 134/75 (BP Location: Left arm, BP Patient Position: Sitting, BP cuff size: Regular)   Pulse 75   Temp 97.3 F (36.3 C) (Oral)   Ht 5\' 8"  (1.727 m)   Wt 77.1 kg  (170 lb)   BMI 25.85 kg/m2   BMI: Body mass index is 25.85 kg/(m^2). Pain: 4  Physical Examination:  General: Well appearing and well groomed.   Mental Status: Pleasant and cooperative.   Musculoskeletal: Normal babinski, symmetric DTRs at the knees/ankles, no clonus on ankle jerk, full strength HF/KE/DF/EHL  FUNCTION walks on his tiptoes    IMAGING STUDIES:     03/05/2016 5:19 PM - Electronic Interface To Epic, Radiant Results   Narrative   EXAM DESCRIPTION:  X-RAY LUMBOSACRAL SPINE 2 OR 3 VIEWS    CLINICAL HISTORY:  1 year progressive pain in the sacral region, with history of laminectomy in 2014 and multiple injections.    TECHNIQUE:  Standing AP and lateral flexion-extension views of the lumbar spine were obtained. An AP view of the pelvis was also obtained    COMPARISON:  None    CONCURRENT SUPERVISION:  I have reviewed the images and agree with the resident interpretation.     Impression   FINDINGS:  There are 5 lumbar type vertebral bodies. There is mild multilevel degenerative disc disease from L1 through L4.    Vertebral body height is maintained. There is no fracture. There is no abnormal motion on the lateral flexion-extension views.    Radiopaque coils are noted along the  left aspect of the L4 vertebral body. Radiopaque surgical tacks project over the left aspect of the pelvis.    The sacrum and sacroiliac joints are normal. The pubic symphysis and pubic rami are normal. Both hips are unremarkable.    IMPRESSION:  No acute osseous injury.         From [place] [facility] note:  MRI Lumbar Spine (Outside) 04/2015:  Degenerative changed at L2-4 with minimal disc bulges >L4-S1 with minimal to mild facet hypertrophy. Mild effacement ventral thecal sack L2-4. No large focal disc extrusions, significant central stenosis, cord signal or cauda equina abornormalities.    No other imaging studies available to view in [place]  No reports in [place], or EPIC media manager    Patient brings in  records:    MRI LS SPINE Jan 2017 from [facility], [place]. W/WO contrast.            Axial [id number]      Slice 4 of 4 axial slices at [id number] (most cephalad)      Slice 3 of 4 axial slices at [id number]      [id number] image slice 2      [id number] image slice 1 (most caudal slice)      Reviewed pertinent information provided by patient/provider. Reviewed MRI LS SPINE images with Ortho Spine surgery NP [place].    Impression: Right leg sciatica symptoms. HX 2014 with lami/discectomy at L3/4 done at [facility].  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    He had CT guided [facility] 2017.    Plan: The recommended plan is for    Caudal epidural steroid injection: we talked about the risk of spinal headache/dural tear, nerve injury/infection/bleeding.    MRI Pelvis bone/sacral plexus study    Upload prior imaging and records    Coordinate care with Pain Dr Trinna Post     We spent 40 minutes with this patient. More than 50 percent of this time was spent discussing the diagnosis and the treatment plan. All questions were answered and OWYNN THWAITES understood and was satisfied with this plan.    Followup: Return to clinic 6 weeks. The patient is encouraged to call us with any questions or problems in the interim. Our contact numbers were given to the patient.

## 2016-04-24 NOTE — Patient Instructions (Addendum)
It was a pleasure seeing you today! You were seen today by Dr. Radene Knee. Today, we discussed:     1. Caudal epidural steroid injection    2. MRI boney pelvis, sacral plexus protocol    Take care!  Macon Large, MD PhD

## 2016-04-25 ENCOUNTER — Ambulatory Visit
Admit: 2016-04-25 | Discharge: 2016-04-25 | Disposition: A | Discharge status: Home / Self Care | Payer: Commercial Managed Care - HMO | Attending: Orthopaedic Surgery | Admitting: Orthopaedic Surgery

## 2016-04-25 ENCOUNTER — Encounter (HOSPITAL_BASED_OUTPATIENT_CLINIC_OR_DEPARTMENT_OTHER): Payer: Self-pay

## 2016-04-25 ENCOUNTER — Telehealth (INDEPENDENT_AMBULATORY_CARE_PROVIDER_SITE_OTHER): Payer: Self-pay | Admitting: Orthopaedic Surgery

## 2016-04-25 DIAGNOSIS — M48062 Spinal stenosis, lumbar region with neurogenic claudication: Principal | ICD-10-CM

## 2016-04-25 NOTE — Telephone Encounter (Signed)
Called patient left message records and cd ready for pick up at [facility], [place], [address].

## 2016-05-01 ENCOUNTER — Encounter: Payer: Self-pay | Admitting: Internal Medicine

## 2016-05-01 ENCOUNTER — Encounter (INDEPENDENT_AMBULATORY_CARE_PROVIDER_SITE_OTHER): Payer: Self-pay | Admitting: Orthopaedic Surgery

## 2016-05-03 ENCOUNTER — Ambulatory Visit (INDEPENDENT_AMBULATORY_CARE_PROVIDER_SITE_OTHER): Payer: Commercial Managed Care - HMO | Admitting: Physical Therapy

## 2016-05-07 ENCOUNTER — Ambulatory Visit (INDEPENDENT_AMBULATORY_CARE_PROVIDER_SITE_OTHER): Payer: Commercial Managed Care - HMO | Admitting: Physical Therapy

## 2016-05-07 DIAGNOSIS — M533 Sacrococcygeal disorders, not elsewhere classified: Principal | ICD-10-CM

## 2016-05-07 DIAGNOSIS — G8929 Other chronic pain: Principal | ICD-10-CM

## 2016-05-07 NOTE — Interdisciplinary (Signed)
Physical Therapy Evaluation        Referring Physician: Danae Chen         Outpatient      ICD-10-CM ICD-9-CM    1. 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 This procedure assisted with pain but new pain and symptoms have developed. Symptoms are right lower extremity radicular pain traveling down the posterior side of the leg to the foot as well as burning and nerve symptoms along the coccyx. Symptoms were most aggravated today by sacroiliac joint testing and light mobility testing to L1-2 lumbar spine. He had a 1.0cm hip rotation right Longer than left with anterior ASIS and iliac crest which was corrected with MET. Cluster testing had 3/5 positive for SIJ dysfunction but was inconclusive due to lack of severity of symptoms on 1 test. 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 Patient has an upcoming MRI of the pelvis / sacral area which will help with diagnostic findings. Patient will return after MRI to review and to make any necessary adjustments to physical therapy plan of care. Currently, stability training was initiated for the core and pelvis. He is a good candidate and should respond well to skilled physical therapy to address the aforementioned restrictions and return to full prior level of function    Rehab Potential: Good      Patient History   Medical History  History of presenting condition: states that he has a history of pain in the back / buttock region. history of 7 dural tears idiopathic in nature. has constraints to physical activity because it increases CSF pressure. has worked with PT in that before. PT prior was to avoid laminectomy at L3-4. history of Laminectomy at L2.      Surgery/Procedure affecting therapy: L2 laminectomy 2014  Previous treatment for condition: Outpatient Physical Therapy  Mechanism of Injury: Insidious onset  Diagnostic Tests: MRI;X-Ray  Diagnostic test comments : see report  Past Medical History:   Diagnosis Date    Acne     Back pain     secondary to herniated disc, from back injury in 2000    Hyperhidrosis     propanolol tx    Hyperlipidemia     LDL > 200, TG ~400 at Dx, statin intolerant.  Per pt, r/o for familial HDL    Parasomnia associated with sexual behavior     Tx with Caprice Renshaw, has been being downtitrated     Past Surgical History:   Procedure Laterality Date    epidural steroid injection      x5 ESIs, most ecent 2017    l3/4 laminectomy  2014    @ [facility], 2014     [place]   Symptoms present in the last 6 months : Numbness or tingling  Functional History  Other Information: fellow in clinical psychology       Subjective       Pain Assessment       Most Recent Value    OP Pain Pain Characteristics, Numeric Pain Rating Scale    Pain Intensity - rating at present 2    Pain Intensity -  rating at worst  9    Frequency  Constant    Description  Sharp, Dull, Aching    Location  Coccyx and down into R LE    Pain Description pulsing, throbbing    Exacerbating Factors Other (comments) getting into and out of the car,  sitting or standing for too long    Relieving Factors - better with movement           Safety Measures  Other Information: fellow in clinical psychology       Objective      Objective    LUMBAR OBJECTIVE EXAM  Involved Side: right   Standing Observation: unremarkable   Sitting Observation: forward flexed posture  Hands on Hips: unremarkable       Range of Motion   Lumbar   Right (degrees) Left (degrees)   Flexion normal    Extension normal    Rotation normal normal   Sidebend normal normal   *=Pain      Lumbar Special Tests   Right Left   Fortin Sign negative negative   Slump Test not done not done   Lumbar Quadrant Test negative  negative   Straight Leg Raise negative positive: pain / symptoms on right    Prone Instability Test negative positive: pain on right         SACROILIAC JOINT TESTING  SIJ Distraction negative negative   SIJ Compression negative negative   Gaenslens not done not done   Thigh Thrust negative positive: pain on R   Sacral Thrust negative positive: pain on right        Palpation: mild tenderness on right SIJ  Joint Mobility: mild-moderate hypermobile L1-2, mild hypermobile L2-3, normal L4-5    Functional  Squat form good  Catchers squat good  Single leg squat good    Hip alignment  right leg longer than left by 1.0cm; corrected 75% with MET  Anterior rotation of Right ASIS compared to left     Radiculopathy Testing  Prone Knee bend negative negative   Slump not done not done           Plan  The plan of care was developed in conjunction with the patients goals. It was reviewed with the patient, including a review of the physical findings, proposed treatment, frequency and duration of treatment sessions, precautions, limitations and expected outcomes. The patient acknowledged understanding of all of the above and agreed to the treatment plan as stated.    Patient Goals :to reduce pain and symptoms  Therapy Goals                                                                                        Current Level Goals for Episode of Care    Functional Deficit     Long term Functional Goal  Goal (Long Term) : patient will report no radiular symptoms after sitting for >30 minutes and will be able to perform exercises at home with no problems  No. visits: 5-7  Goal status: New   Impairment #1 Education Short Term Goal #1 Impairment: Education  Education: Patient able to return demonstrate Home Exercise  Program independently to enable patient to achieve stated functional goal  No. visits: 1-3  Goal status: New   Impairment #2 Strength Short Term Goal #2 Impairment : Strength  Custom goal: Improve transverse abdominis  contraction and core stability as indicated by ability to perform 10 pelvic tilts and 10 dead bugs while maintaining a neutral spine   No. visits: 3-5  Goal status: New   Functional Limitation Reporting    Rationale: Acceptable functional assessment;Clinical judgement  Visit type: Initial ongoing  Functional Assessment Tool  Rationale: Acceptable functional assessment;Clinical judgement     Outpatient Treatment Plan  OP Treatment Frequency: 1 time per week  OP Treatment Duration: 8 weeks  OP Status of treatment: Patient evaluated and will benefit from ongoing skilled therapy                Treatment Today  Type of Eval  Low Complexity (35465): Completed  Therapeutic Procedures  Therapeutic exercise  (68127) : Patient education;Range of motion exercises;Strengthening exercises     Total TIMED Treatment (min) : 15    Therapeutic exercise  :     Patient was educated on home exercise program including appropriate cueing and interventions. See scanned copy in the patients electronic medical record.              Treatment Time   Total TIMED Treatment  (min): 15  Total Treatment Time (min): 45

## 2016-05-09 ENCOUNTER — Encounter (INDEPENDENT_AMBULATORY_CARE_PROVIDER_SITE_OTHER): Payer: Commercial Managed Care - HMO | Admitting: Internal Medicine

## 2016-05-15 ENCOUNTER — Other Ambulatory Visit: Payer: Self-pay

## 2016-05-15 ENCOUNTER — Encounter (INDEPENDENT_AMBULATORY_CARE_PROVIDER_SITE_OTHER): Payer: Commercial Managed Care - HMO | Admitting: Physician Assistant

## 2016-05-16 ENCOUNTER — Ambulatory Visit (INDEPENDENT_AMBULATORY_CARE_PROVIDER_SITE_OTHER): Payer: Commercial Managed Care - HMO | Admitting: Internal Medicine

## 2016-05-16 VITALS — BP 132/78 | HR 78 | Temp 98.2°F | Resp 14 | Ht 68.0 in | Wt 171.8 lb

## 2016-05-16 DIAGNOSIS — L74519 Primary focal hyperhidrosis, unspecified: Secondary | ICD-10-CM

## 2016-05-16 DIAGNOSIS — G4750 Parasomnia, unspecified: Principal | ICD-10-CM

## 2016-05-16 DIAGNOSIS — R61 Generalized hyperhidrosis: Secondary | ICD-10-CM

## 2016-05-16 MED ORDER — PROPRANOLOL HCL CR 60 MG OR CP24
60.0000 mg | ORAL_CAPSULE | Freq: Every day | ORAL | 1 refills | Status: DC
Start: 2016-05-16 — End: 2016-07-20

## 2016-05-16 MED ORDER — CLONAZEPAM 0.5 MG OR TABS
ORAL_TABLET | ORAL | 0 refills | Status: DC
Start: 2016-05-16 — End: 2016-06-18

## 2016-05-16 NOTE — Progress Notes (Signed)
Primary Care FOLLOW UP VISIT    Routine Follow Up Appointment: PRIMARY CARE    Erik Burke is a 36 year old year old male with a who comes in for routine follow up  The patient has presents for Recheck  36 YO man with a history of HLD, parasomnia, hyperhidrosis presents to followup    1. LBP - following with sports medicine - MRI pending.  Reports he is happy with sports medicine / workup thus far, but encouraged by progress given he is having continued symptoms.    2. Parasomnia - has weaned down benzo - 2 weeks (0.'5mg'$ ), did occur once to knowledge.  0.'75mg'$  at night and 0.'25mg'$  qAM  Interested in alternatives to benzodiazepines given longterm ADEs.    3. Hyperhidrosis - no current issues with medications     Review of Systems:  Review of Systems   Constitutional: Negative for malaise/fatigue.   Musculoskeletal: Positive for back pain. Negative for falls, myalgias and neck pain.   Neurological: Negative for dizziness, tingling, tremors, sensory change, speech change, focal weakness and headaches.       Past Medical History:  Patient Active Problem List   Diagnosis    Hyperlipidemia    Back pain    Parasomnia    Acne, unspecified acne type    Hyperhidrosis    Dural tear    Postlaminectomy syndrome of lumbar region       Medications were reviewed and updated on the Active Medication List    Medications:    Current Outpatient Prescriptions:     clonazePAM (KLONOPIN) 0.5 MG tablet, 1 mg po QHS (2 tabs) and 0.'5mg'$  QAM (1 tab), Disp: 60 tablet, Rfl: 0    lidocaine (XYLOCAINE) 4 % external solution, Apply to affected area as needed, Disp: 1 bottle, Rfl: 0    propranolol (INDERAL LA) 60 MG CP24, Take 1 capsule (60 mg) by mouth daily., Disp: 90 each, Rfl: 1    tretinoin (RETIN-A) 0.025 % cream, Use a pea sized amount on the entire face nightly.  At first, you may use every other night to avoid dryness and irritation, and gradually increase use to nightly as tolerated, Disp: 45 g, Rfl:  3    Psychosocial:  Social History     Social History    Marital status: Married     Spouse name: N/A    Number of children: N/A    Years of education: N/A     Occupational History    Not on file.     Social History Main Topics    Smoking status: Never Smoker    Smokeless tobacco: Never Used    Alcohol use Yes      Comment: 1-3 drinks infrequently    Drug use: Not on file    Sexual activity: Not on file     Other Topics Concern    Not on file     Social History Narrative    PhD in Psychology, works in [employer], [place] [employer]       No Known Allergies    The Family History:  Family History   Problem Relation Age of Onset    Cancer Mother      cervical    No Known Problems Father     Cancer Maternal Grandmother      ovarian    No Known Cancer Maternal Grandfather     Heart Disease Maternal Grandfather     Cancer Paternal Grandmother  pancreatic    Cancer Paternal Grandfather      mesothelioma    Cancer Brother 61     osteosarcoma       Physical Exam:  BP 132/78 (BP Location: Right arm, BP Patient Position: Sitting, BP cuff size: Large)   Pulse 78   Temp 98.2 F (36.8 C) (Oral)   Resp 14   Ht '5\' 8"'$  (1.727 m)   Wt 77.9 kg (171 lb 12.8 oz)   SpO2 99%   BMI 26.12 kg/m2  Body mass index is 26.12 kg/(m^2).  Physical Exam   Constitutional: He is well-developed, well-nourished, and in no distress. No distress.   HENT:   Head: Normocephalic and atraumatic.   Mouth/Throat: No oropharyngeal exudate.   Eyes: Conjunctivae are normal. No scleral icterus.   Neurological: He is alert. Gait normal.   Skin: He is not diaphoretic.   Psychiatric: Mood, affect and judgment normal.   Vitals reviewed.      Studies  Lab Results   Component Value Date    BUN 10 02/11/2016    CREAT 1.13 02/11/2016    CL 102 02/11/2016    NA 142 02/11/2016    K 4.1 02/11/2016    [place] 9.7 02/11/2016    TBILI 0.59 02/11/2016    ALB 4.8 02/11/2016    TP 7.3 02/11/2016    AST 19 02/11/2016    ALK 61 02/11/2016    BICARB 28 02/11/2016    ALT 21  02/11/2016    GLU 93 02/11/2016     No results found for: A1C  Lab Results   Component Value Date    AST 19 02/11/2016    ALT 21 02/11/2016    ALK 61 02/11/2016    TP 7.3 02/11/2016    ALB 4.8 02/11/2016    TBILI 0.59 02/11/2016     Lab Results   Component Value Date    CHOL 231 02/11/2016    HDL 37 02/11/2016    LDLCALC 150 02/11/2016    TRIG 221 02/11/2016     No results found for: GFRAA  Lab Results   Component Value Date    WBC 4.4 02/11/2016    RBC 5.04 02/11/2016    HGB 15.5 02/11/2016    HCT 44.9 02/11/2016    MCV 89.1 02/11/2016    RDW 11.5 02/11/2016    PLT 245 02/11/2016    LYMPHS 42 02/11/2016    MONOS 10 02/11/2016    EOS 6 02/11/2016    BASOS 1 02/11/2016     No results found for: INR  No results found for: COLORUA, APPEARUA, [place], BILIUA, KETONEUA, [place], BLOODUA, [place], PROTEINUA, UROBILUA, NITRITEUA, LEUKESTUA, WBCUA, RBCUA, EPITHCELLSUA, HYALINEUA, CRYSTALSUA, COMMENTSUA    The ASCVD Risk score Mikey Bussing DC Jr, et al., 2013) failed to calculate for the following reasons:    The 2013 ASCVD risk score is only valid for ages 36 to 72    Health Maintenance: was reviewed, key updates utd    ASSESSMENT AND PLAN  Erik Burke is a 36 year old year old male with a who comes in for routine follow up    Diagnoses and all orders for this visit:    Parasomnia - discussed options like adjunct with ambien / lunesta vs trial trazadone.  Agree with continuing to wean clonazepam.  Pt declines again psych referral (given concerns re: his work in a similar profession, and similarly sleep CBT / sleep medicine referral given he professionally may work in the  area).  RTC 3 months - see pt instructions  -     clonazePAM (KLONOPIN) 0.5 MG tablet; 1 mg po QHS (2 tabs) and 0.'5mg'$  QAM (1 tab)    Hyperhidrosis - Refill as below  -     propranolol (INDERAL LA) 60 MG CP24; Take 1 capsule (60 mg) by mouth daily.        Patient Instructions   Try continuing to wean the klonopin   Come see me in 3 months  If you want to try  some of the other options, read about:    1. Trazodone to adjunct  2. Seroquel   3. Lorrin Mais Johnnye Sima        Orders this encounter:  Lab No orders of the defined types were placed in this encounter.    Imaging No orders of the defined types were placed in this encounter.    Procedures No orders of the defined types were placed in this encounter.    Other No orders of the defined types were placed in this encounter.      RETURN TO CLINIC INSTRUCTIONS  Return to clinic in 5months) for followup    .

## 2016-05-16 NOTE — Patient Instructions (Addendum)
Try continuing to wean the klonopin   Come see me in 3 months  If you want to try some of the other options, read about:    1. Trazodone to adjunct  2. Seroquel   3. Lorrin Mais Johnnye Sima

## 2016-05-18 ENCOUNTER — Telehealth (INDEPENDENT_AMBULATORY_CARE_PROVIDER_SITE_OTHER): Payer: Self-pay | Admitting: Orthopaedic Surgery

## 2016-05-18 ENCOUNTER — Ambulatory Visit
Admission: RE | Admit: 2016-05-18 | Discharge: 2016-05-18 | Disposition: A | Discharge status: Home / Self Care | Payer: Commercial Managed Care - HMO | Attending: Diagnostic Radiology | Admitting: Diagnostic Radiology

## 2016-05-18 ENCOUNTER — Encounter (HOSPITAL_BASED_OUTPATIENT_CLINIC_OR_DEPARTMENT_OTHER): Payer: Self-pay

## 2016-05-18 DIAGNOSIS — Z5329 Procedure and treatment not carried out because of patient's decision for other reasons: Secondary | ICD-10-CM | POA: Insufficient documentation

## 2016-05-18 DIAGNOSIS — M25551 Pain in right hip: Principal | ICD-10-CM

## 2016-05-18 DIAGNOSIS — R2991 Unspecified symptoms and signs involving the musculoskeletal system: Secondary | ICD-10-CM | POA: Insufficient documentation

## 2016-05-18 DIAGNOSIS — R1030 Lower abdominal pain, unspecified: Secondary | ICD-10-CM | POA: Insufficient documentation

## 2016-05-18 NOTE — Telephone Encounter (Signed)
LOV 05/16/16 Dr Radene Knee  : Right leg sciatica symptoms. HX 2014 with lami/discectomy at L3/4 done at [facility].  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  He had CT guided [facility] 2017.  Plan: The recommended plan is for  Caudal epidural steroid injection: we talked about the risk of spinal headache/dural tear, nerve injury/infection/bleeding.  MRI Pelvis bone/sacral plexus study  Upload prior imaging and records  Coordinate care with Pain Dr Trinna Post    Pt calls today: He tried to have a MRI today which was ordered by Dr. Radene Knee. He felt a pull in his abdomen. They stopped the exam and contacted the radiologist. The patient has an embolization coil in his abdomen. The radiologist felt that they needed to stop the MRI and Ask Dr. Radene Knee for a CT order instead.    Further, pt left MyChart message on 05/01/16. He would like to know if Dr. Radene Knee was able to contact Dr. Lorelle Gibbs, neuroradiologist at [facility]. Cell [phone]. She is on faculty. Will send to provider

## 2016-05-18 NOTE — Telephone Encounter (Signed)
CT right hip ordered

## 2016-05-18 NOTE — Telephone Encounter (Signed)
Patient states he had his 2nd attempted at an MRI today but failed because of "a coil in my spine". Per patient, radiology recommends a CAT scan instead as MRI does not seem to be an option. Patient would like a call to discuss what the next step is.

## 2016-05-21 NOTE — Telephone Encounter (Signed)
Spoke to pt and given number to schedule CT rt extremity.

## 2016-05-23 ENCOUNTER — Ambulatory Visit (INDEPENDENT_AMBULATORY_CARE_PROVIDER_SITE_OTHER): Payer: Commercial Managed Care - HMO | Admitting: Dermatology

## 2016-05-23 ENCOUNTER — Telehealth (INDEPENDENT_AMBULATORY_CARE_PROVIDER_SITE_OTHER): Payer: Self-pay | Admitting: Orthopaedic Surgery

## 2016-05-23 ENCOUNTER — Encounter (INDEPENDENT_AMBULATORY_CARE_PROVIDER_SITE_OTHER): Payer: Self-pay | Admitting: Dermatology

## 2016-05-23 DIAGNOSIS — L700 Acne vulgaris: Secondary | ICD-10-CM

## 2016-05-23 DIAGNOSIS — D229 Melanocytic nevi, unspecified: Secondary | ICD-10-CM

## 2016-05-23 DIAGNOSIS — I781 Nevus, non-neoplastic: Secondary | ICD-10-CM

## 2016-05-23 DIAGNOSIS — L905 Scar conditions and fibrosis of skin: Secondary | ICD-10-CM

## 2016-05-23 DIAGNOSIS — L814 Other melanin hyperpigmentation: Secondary | ICD-10-CM

## 2016-05-23 NOTE — Telephone Encounter (Addendum)
Spoke to pt: He understands that Dr. Radene Knee would like him to complete the CT. This is scheduled at [facility] on 05/24/16 7am.    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  Dr. Lorelle Gibbs from [facility] had tried to do blood patches to each dural tear. This was unsuccessful-pt continued with HA. She did CT Myelogram guided Fibrin Glue Patch to each dural tear which was successful. It was her opinion that it would be difficult for pt to have a caudal ESI in the future. Pt would appreciate if Dr. Radene Knee received info on CT and was able to call Dr. Pearline Cables to discuss the [facility] which is scheduled for 05/28/16. Her number is [phone]. Pt  States that she would be very willing to discuss his case with Dr. Radene Knee.

## 2016-05-23 NOTE — Telephone Encounter (Signed)
[place] end message to Dr. Trinna Post to view Dr. Harriet Pho recommendation to see him and my discussion of history with multiple dural tears and treatments issues from Dr. Pearline Cables at [facility]. We would appreciate Dr. Trinna Post contacting Dr. Pearline Cables and discussing treatment once he has seen pt in return appt.

## 2016-05-23 NOTE — Patient Instructions (Signed)
SUNSCREENS:    You should wear sunscreen every day, as part of your daily routine.    On face, recommend daily use of a moisturizer that also has SPF 30 or higher. Neutrogena and Eucerin both make products that are very light and easy to apply.    On arms and other sun-exposed skin, use sunscreen/moisturizer at least SPF 50 each day. Sunscreens with zinc oxide or titanium dioxide provide broader spectrum coverage. Sunscreen needs to be reapplied every 1.5 to 2 hours when you are outside.     For acne prone skin:  Sunscreen should say NON-COMEDOGENIC on it.      If you will be in the sun for an extended period of time, we recommend wearing sun protective clothing. "Sun precautions" online website has clothing w/ protection factor. "Coolibar" is another company with protective clothing. Rit Sunguard on amazon may add protection factor to clothes.     Remember to avoid sun during the peak hours of 10 am - 2 pm.  Wear a wide brimmed hat when outside.  Wear sunglasses that will cover your entire eye and upper cheeks if possible.     Remember the ABCDEs and contact our office if any of the following are observed in your moles:  A-Asymmetry  B-Borders becoming irregular  C-Color is no longer uniform  D-Diameter greater than 6mm (about the size of pencil eraser)  E-Evolving-if your lesion undergoes changes and it evolves into a different appearing lesion  -Ask your hair stylist to alert you if any suspicious lesions are noticed on scalp while getting your hair cut

## 2016-05-23 NOTE — Telephone Encounter (Signed)
Noted, Will route to provider to review

## 2016-05-23 NOTE — Telephone Encounter (Signed)
Patient is calling back would like to know if Dr. Radene Knee was able to contact Dr. Lorelle Gibbs, neuroradiologist at [facility]. Cell [phone].      Patient state he spoke with Ardell state she is aware of the situation.     Please see previous telephone encounter.

## 2016-05-23 NOTE — Telephone Encounter (Signed)
Patient with complicated history and developments. Last plan:    Caudal epidural steroid injection: we talked about the risk of spinal headache/dural tear, nerve injury/infection/bleeding.    MRI Pelvis bone/sacral plexus study    Upload prior imaging and records    Coordinate care with Pain Dr Trinna Post    Since then, patient was unable to complete MRI, and a CT was ordered alternatively.    At this point, suggest patient complete the treatment plan versus coming in for a clinic update to address concerns since his last visit a month ago. (I'm unclear specifically about what to talk to his neuro-radiologist at this point.)

## 2016-05-23 NOTE — Telephone Encounter (Addendum)
Given complexity, suggest patient discuss issues with Dr Trinna Post and also follow up with him for further [facility]  treatment, especially since Dr Trinna Post is more experienced than I am at treating dural tears.

## 2016-05-23 NOTE — Progress Notes (Signed)
Primary MD: [name]  Consult Requested By: [name]    HISTORY:  Erik Burke is a 36 year old male with history of acne who presents for lesions of concern.    LOV: 02/25/2016, seen for initial encounter for acne   Biopsies: none   Diagnoses/treatment:    -comedonal and inflammatory acne, recommended tretinoin 0.025% and BPO. Pt did not want to start doxycycline.     Pt complaints: improved acne   Location: face   Symptoms: none   Duration of symptoms: many years   Exacerbating factors: none   Treatments: tretinoin 0.025% qhs.    Accutane many years ago, with recurrence of symptoms since that time    Other complaints: none     History of skin cancer or atypical moles:   -few prior mole biopsies outside of [place].  Right back biopsy was shown to be atypical       Patient reports regular use of sunscreen.  Frequent direct sun exposure.       Past Medical History:  The patient's dermatologic medical history is negative for melanoma, negative for NMSC    The patient's dermatologic family history is negative for melanoma, and negative for other skin disease.      The patient  has a past medical history of Acne; Back pain; Hyperhidrosis; Hyperlipidemia; and Parasomnia associated with sexual behavior.     The medications were reviewed.    Allergies: Review of patient's allergies indicates no known allergies.    REVIEW OF SYSTEMS:  CONSTITUTIONAL: negative for fever or chills  DERMATOLOGIC: Feels well, no other skin complaints.    PHYSICAL EXAM:  Skin Type:                    2  General Appearance: within normal limits  Neuro: Alert and oriented x 3  Psych: Mood and affect within normal limits  Eyes: Inspection of lids and conjunctiva within normal limits  Cardiovascular: Inspection of peripheral vascular system showed no swelling, edema. Extremities warm, well-perfused.   Extremities: inspection of digits and nails wnl except if noted below     Skin: The areas examined included scalp, face,  oropharynx, sclerae, neck, RUE, LUE, chest, abdomen, back, RLE, LLE, buttock.    Additional areas include all sweat glands, sebaceous glands, apocrine glands on the pertinent examined areas above, and nails on the hands.     All was normal except for the following:    - Few comedones on the forehead and cheeks    - On areas of sun-exposed skin are 2-3 mm rough, pink papules with scale     - Multiple brown, <46mm, smooth bordered macules and papules scattered over body    - Multiple tan and brown, even-pigmented macules scattered over body sun exposed areas    - On sun exposed areas, there are fine telangiectasias and hyperpigmented skin with accentuation of skin lines    - Well healed scars without nodularity or pigmentation      ASSESSMENT AND TREATMENT PLAN:      Acne:   Type: primarily comedonal,  Severity: mild, well-controlled presently  Location: face  Scarring: with prior scarring.  Prior treatments: Accutane many years ago  - The pathophysiology of acne is an inflammatory, pustular and comedonal skin disease under some hormonal influence.  Treatment is aimed at prevention of new acne formation.    - Various treatment options exist, including topical and topical antibiotics, oral antibiotics, topical retinoids, OCPs  and other hormonal treatment, and oral isotretinoin.      Recommendations:  - Continue tretinoin 0.025% qhs.  Pt will ensure his wife, who is pregnant, is not exposed  - BPO wash daily    Pt instructions and counseling    -Avoid picking    -Moisturizing: Be sure to apply moisturizers liberally, as many acne treatments are very drying.  Moisturize several times daily, especially after washing.  If you are using a retinoid cream, you can apply a moisturizer about an hour after application of the retinoid--this will improve symptoms of irritation and dryness.  Recommend a fragrance-free, non-comedogenic product such as Cetaphil cream.    Routine sun protection is also important, as many acne treatment  are also very photo-sensitizing.  Recommend broad spectrum sunblock with SPF 30+ (consider products by Neutrogena, Eucerin or Cetaphil).   -Check the product labels for all cosmetics and skin care products: use only NON-COMEDOGENIC products on your skin, as many can clog pores and worsen acne.    -Cleaning: For routine cleansing, use a gentle cleanser such as Cetaphil gentle cleanser or Dove bar soap to avoid over-drying the skin.   -Wash in the morning with a benzoyl peroxide containing product such as Neutrogena Clear Pore.  Rinse off completely, and beware this can bleach clothing and linens.      Topical medications:   -Apply pea-sized amount of tretinoin 0.025% once nightly to entire face. Initially start on an every-other night basis, and gradually increase over a few weeks to nightly use as tolerated.     Counseling:     Topical retinoids: This may cause dryness, redness, and irritation, especially when starting treatment.  Additionally, this is photosensitizing, so be sure to use adequate sun protection with this product.  This product has not been demonstrated to be safe to use in pregnancy or breastfeeding.    Benzoyl peroxide has not been demonstrated to be safe to use in pregnancy or breastfeeding.  Rinse off completely after use.  This product can irritate skin, and may bleach clothing and linens.      Benign melanocytic nevi: none concerning for malignancy on exam today  - Pt instructed to return to clinic if any changes in color, size shape  - Reviewed ABCDE's of melanoma, handout given  - Advised pt on sun protection      Benign solar lentigos: none concerning for malignancy on exam today  - Pt instructed to return to clinic if any changes in color, size shape  - Reviewed ABCDE's of melanoma, handout given  - Advised pt on sun protection      Telangiectasias and Sun damage  -The nature of sun-induced photo-aging and skin cancers has been discussed.  Sun avoidance, protective clothing (including the  use of a wide-brimmed hat), and the use of at least 30-SPF sunscreens are advised.  Please observe closely for skin damage/changes, and call if such occur.    Scar/History of atypical nevi  - Risks of new primary and recurrent skin cancers discussed with patient. Signs and symptoms discussed with patient.  - No evidence of recurrence on exam      - Return for follow up skin check in 6 mos - 12 mos      - Diagnoses, natural course, treatments and risks were discussed with the patient.            Note written by: Judeth Cornfield. Gwynneth Munson, MD, PhD

## 2016-05-24 ENCOUNTER — Telehealth (INDEPENDENT_AMBULATORY_CARE_PROVIDER_SITE_OTHER): Payer: Self-pay | Admitting: Orthopaedic Surgery

## 2016-05-24 ENCOUNTER — Encounter (INDEPENDENT_AMBULATORY_CARE_PROVIDER_SITE_OTHER): Payer: Self-pay | Admitting: Orthopaedic Surgery

## 2016-05-24 ENCOUNTER — Ambulatory Visit
Admission: RE | Admit: 2016-05-24 | Discharge: 2016-05-24 | Disposition: A | Discharge status: Home / Self Care | Payer: Commercial Managed Care - HMO | Attending: Diagnostic Radiology | Admitting: Diagnostic Radiology

## 2016-05-24 DIAGNOSIS — M47898 Other spondylosis, sacral and sacrococcygeal region: Secondary | ICD-10-CM | POA: Insufficient documentation

## 2016-05-24 DIAGNOSIS — M160 Bilateral primary osteoarthritis of hip: Secondary | ICD-10-CM | POA: Insufficient documentation

## 2016-05-24 DIAGNOSIS — M533 Sacrococcygeal disorders, not elsewhere classified: Secondary | ICD-10-CM | POA: Insufficient documentation

## 2016-05-24 DIAGNOSIS — M25551 Pain in right hip: Secondary | ICD-10-CM

## 2016-05-24 DIAGNOSIS — G8929 Other chronic pain: Secondary | ICD-10-CM | POA: Insufficient documentation

## 2016-05-24 NOTE — Telephone Encounter (Signed)
Patient of Dr. Radene Knee has the following questions for Ardell, RN.     1. Should I cancel my Epidural with Dr. Radene Knee on Tuesday 05/29/16?    2. I am seeing a PA on Tuesday 05/29/16 instead of Dr. Trinna Post due to limited availability. Do you recommend having the PA get a hold of Physician at [facility] as proposed initially with Dr. Trinna Post?     Please call patient.

## 2016-05-25 ENCOUNTER — Ambulatory Visit (INDEPENDENT_AMBULATORY_CARE_PROVIDER_SITE_OTHER): Payer: Commercial Managed Care - HMO | Admitting: Physical Therapy

## 2016-05-25 DIAGNOSIS — G8929 Other chronic pain: Principal | ICD-10-CM

## 2016-05-25 DIAGNOSIS — M533 Sacrococcygeal disorders, not elsewhere classified: Principal | ICD-10-CM

## 2016-05-25 NOTE — Interdisciplinary (Signed)
Physical Therapy Daily Follow up Note    Referring Physician: Danae Chen               Preferred [place]  Patient to continue therapy for     ICD-10-CM ICD-9-CM    1. Coccygeal pain, chronic M53.3 724.79     G89.29 338.29        Patient stated goal: to reduce pain and symptoms    Objective Findings:     Reproduction of symptoms with piriformis stretch  Tenderness with palpation and pin-and-stretch  Pain with prone position but tolerable  Pain with transitioning in and out of quadruped   Symptom reproduction with Gr II glid on L1 and L4                     PT Progress Report       05/25/16 0700    Assessment     Assessment  "Erik Burke" had a reproduction of symptoms with piriformis testing and as a result we began diagnostic testing of repeated piriformis stretching on current pain and symptoms. Recent CT scan revealed a bone spur on the superior lateral posterior boarder of the ilium which may be concurrent with symptoms by producing increased tension in the gluteals and reducing mobility of the muscles causing entrapment of nerves and radicular symptoms. He was instructed to perform piriformis and glut stretching with assisted release of this area over the next 2 weeks. Plan on assessing response to piriformis stretching at next apt. He stated he wants to get a lumbar spine CT scan as well in the future.     Rehab Potential Good      05/25/16 0700    Outpatient Treatment Plan    OP Treatment Frequency 1 time per week    OP Treatment Duration 8 weeks      05/25/16 0700    Treatment Plan Discussion    Treatment Plan Discussion & Agreement Patient    Patient/Family Questions Yes - All questions asked & answered      05/25/16 0700    Patient/Family Education    Learner(s) Patient    Patient/family training in appropriate therapeutic interventions Ongoing      05/25/16 0700    Subjective    Status since last treatment  states that the hip is bothering him a little more  this morning, feeling some radicular symptoms. was having a lot of pain sitting yesterday.       05/25/16 0700    Outpatient Pain Assessment    OP Pain Pain Characteristics;Numeric Pain Rating Scale      05/25/16 0700    Numeric Pain Rating Scale     Pain Intensity - rating at present 3    Pain Intensity - rating at worst  6    Frequency  Constant    Description  Sharp;Dull;Aching    Location  Coccyx and down into R LE    Pain Description pulsing, throbbing      05/25/16 0700    Outpatient Pain Characteristics    Exacerbating Factors Other (comments) getting into and out of the car; sitting or standing for too long    Relieving Factors - better with movement      05/25/16 0700    Activity Restrictions    Activity Restrictions None      05/25/16 0700    Treatment provided today    Payor  Medicare/Private      05/25/16 0700  Therapeutic Procedures    Manual therapy [phone])  Soft tissue mobilization;Trigger point release       Total TIMED Treatment (min)  15      Manual therapy  Prone pin and stretch ti piriformis, trigger point release to gluts, piriformis, Soft tissue manipulation to lumbar spine paraspinals     Therapeutic exercise  [id number])  Patient education;Range of motion exercises;Strengthening exercises       Total TIMED Treatment (min)  30      Therapeutic exercise   active range of motion flexion, lateral flexion, rotation, side bending, quadruped testing   passive range of motion hip flexion   quadruped multifidi lifts   Piriformis stretch 2x30" (added to home exercise program)    Self release with lacrosse ball x2' (added to home exercise program)        05/25/16 0700    Focus for Next Treatment    Focus for Next Treatment Manual therapy;Therapeutic exercise      05/25/16 0700    Treatment Time     Total TIMED Treatment  (min) 45    Total Treatment Time (min) 45

## 2016-05-25 NOTE — Telephone Encounter (Signed)
I have left VM: yes he should cancel Injection with Dr. Radene Knee for 05/29/16. Yes he should have the PA contact Dr. Pearline Cables.

## 2016-05-29 ENCOUNTER — Encounter (INDEPENDENT_AMBULATORY_CARE_PROVIDER_SITE_OTHER): Payer: Commercial Managed Care - HMO | Admitting: Physician Assistant

## 2016-05-29 ENCOUNTER — Encounter (INDEPENDENT_AMBULATORY_CARE_PROVIDER_SITE_OTHER): Payer: Self-pay | Admitting: Physician Assistant

## 2016-05-29 ENCOUNTER — Ambulatory Visit (INDEPENDENT_AMBULATORY_CARE_PROVIDER_SITE_OTHER): Payer: Commercial Managed Care - HMO | Admitting: Physician Assistant

## 2016-05-29 ENCOUNTER — Ambulatory Visit (HOSPITAL_BASED_OUTPATIENT_CLINIC_OR_DEPARTMENT_OTHER): Payer: Commercial Managed Care - HMO | Admitting: Orthopaedic Surgery

## 2016-05-29 VITALS — BP 112/74 | HR 84 | Temp 97.8°F

## 2016-05-29 DIAGNOSIS — G8929 Other chronic pain: Secondary | ICD-10-CM

## 2016-05-29 DIAGNOSIS — M961 Postlaminectomy syndrome, not elsewhere classified: Principal | ICD-10-CM

## 2016-05-29 DIAGNOSIS — M545 Low back pain: Secondary | ICD-10-CM

## 2016-05-29 NOTE — Progress Notes (Signed)
PAIN CLINIC FOLLOW UP NOTE    Primary Care Physician [name]    SUBJECTIVE:  This is a 36 year old male with chronic with coccyx and sacrum pain with radiation into the bilateral buttock (R>L) and down the right lateral thigh to the level of the knee. Pain is exacerbated with sitting, activity, and changing positions.  Since his initial visit, he reports that the radiation down his lateral thigh to the level of the knee is new. There are no other changes from previous visit. There are no red flag symptoms.  He recently followed up with Dr. Radene Knee in Ortho and he recommended a Caudal-ESI and ordered MRI Pelvis bone/sacral plexus study. He was unable to obtain the MRI 2/2 extreme pain/discomfort in the left lower abdomen likely due to a MR incompatibility of coil in left low quadrant. He spoke to his neurosurgeon from [facility] and she is not recommending that he undergo a caudal ESI 2/2 history of idiopathic dural tears.     Today, he is follow up to coordinate care and to request a CT of the lumbar spine and for [place] to contact his neurosurgeon Dr. Lorelle Gibbs from [facility] for recommendations      Pain Treatments  Previous pain procedures and response include:   -ESI in the past  Patient is currently using the following pain medications:  -none    Patient's significant social history is:   Social History     Social History    Marital status: Married     Spouse name: N/A    Number of children: N/A    Years of education: N/A     Occupational History    Not on file.     Social History Main Topics    Smoking status: Never Smoker    Smokeless tobacco: Never Used    Alcohol use Yes      Comment: 1-3 drinks infrequently    Drug use: Not on file    Sexual activity: Not on file     Other Topics Concern    Not on file     Social History Narrative    PhD in Psychology, works in [employer], [employer]       Review of Systems   General: Negative  Gastrointestinal: Negative  Psychiatry: Negative  Respiratory:  Negative  Musculoskeletal: Joint pain and Muscle pain  Neurological: Negative      Past medical history is significant for:   has a past medical history of Acne; Back pain; Hyperhidrosis; Hyperlipidemia; and Parasomnia associated with sexual behavior.    Current medications include:  Current Outpatient Prescriptions   Medication Sig Dispense Refill    clonazePAM (KLONOPIN) 0.5 MG tablet 1 mg po QHS (2 tabs) and 0.5mg  QAM (1 tab) 60 tablet 0    lidocaine (XYLOCAINE) 4 % external solution Apply to affected area as needed 1 bottle 0    propranolol (INDERAL LA) 60 MG CP24 Take 1 capsule (60 mg) by mouth daily. 90 each 1    tretinoin (RETIN-A) 0.025 % cream Use a pea sized amount on the entire face nightly.  At first, you may use every other night to avoid dryness and irritation, and gradually increase use to nightly as tolerated 45 g 3     No current facility-administered medications for this visit.        Patient's current allergies are:  Review of patient's allergies indicates no known allergies.    Physical Exam  Vitals: BP  112/74 (BP Location: Left arm, BP Patient Position: Sitting, BP cuff size: Regular)   Pulse 84   Temp 97.8 F (36.6 C) (Oral)  General: healthy, alert, cooperative  Mental Status:  alert, oriented x 3. Speech is clear/ normal  Affect: euthymic  Skin:  No rashes or bruises.  HEENT:  Pupils equal, not pinpoint.  Pulmonary:  Breathing easily without tachypnea or bradypnea.  Cardiac:  No LE edema.  Abdomen:  Not distended.  Ambulation: Pt is able to raise from a seated position without difficulty. Gait is not antalgic and the patient ambulates without assistance.   Musculoskeletal:    Back: No TTP palpation over PSIS,Sacrum, and SI joint. [place],Fabers, and Karrie Meres is negative.  Neurosensory: Motor exam: 5/5 BLE. Sensory exam: normal sensation to light touch.    ASSESSMENT  Encounter Diagnoses   Name Primary?    Postlaminectomy syndrome of lumbar region Yes    Chronic low back pain without  sciatica, unspecified back pain laterality        This is a 36 year old male with chronic sacrum coccyx pain with radiation down the right lateral thigh. 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 Caudal ESI was recommended by Dr. Radene Knee in [place].  The patient does not want to undergo this procedure until we consult with his neurosurgeon from [facility].  In addition, he is requesting a lumbar CT for further evaluation of his back secondary to being unable to get the in MRI secondary to a radiopaque coil in left abdominal region).  Unclear if undergoing a lumbar CT would be beneficial or change our treatment plan.  There is some concern with undergoing an epidural secondary to history of idiopathic dural tears.  No procedure or imaging will be recommended/ordered until we consult with Dr. Lorelle Gibbs from [place]. Dr. Trinna Post was consulted and is agreeable with this plan. He also placed phone call to Dr. Pearline Cables.      PLAN    Medication Plan:   No changes    Procedure Plan:  - none art this time. We will wait for consultation between Dr. Trinna Post and his neurosurgeon Dr. Lorelle Gibbs from [place].    Follow-up: PRN    Dr. Trinna Post   was present in the clinic as the supervising physician and was immediately available.

## 2016-05-31 ENCOUNTER — Encounter (INDEPENDENT_AMBULATORY_CARE_PROVIDER_SITE_OTHER): Payer: Self-pay | Admitting: Internal Medicine

## 2016-05-31 ENCOUNTER — Telehealth (INDEPENDENT_AMBULATORY_CARE_PROVIDER_SITE_OTHER): Payer: Self-pay | Admitting: Pain Medicine

## 2016-05-31 DIAGNOSIS — Z0000 Encounter for general adult medical examination without abnormal findings: Principal | ICD-10-CM

## 2016-05-31 NOTE — Telephone Encounter (Signed)
Pt is f/up on [place], [place] conversation with Dr. Pearline Cables from 05/29/16. He is asking for a call back regards to conversation and what the next step is. Please advise.

## 2016-06-01 ENCOUNTER — Ambulatory Visit (INDEPENDENT_AMBULATORY_CARE_PROVIDER_SITE_OTHER): Payer: Commercial Managed Care - HMO | Admitting: Physical Therapy

## 2016-06-01 DIAGNOSIS — G8929 Other chronic pain: Principal | ICD-10-CM

## 2016-06-01 DIAGNOSIS — M533 Sacrococcygeal disorders, not elsewhere classified: Principal | ICD-10-CM

## 2016-06-01 NOTE — Interdisciplinary (Signed)
Physical Therapy Daily Follow up Note    Referring Physician: Danae Chen               Preferred [place]  Patient to continue therapy for     ICD-10-CM ICD-9-CM    1. Coccygeal pain, chronic M53.3 724.79     G89.29 338.29        Patient stated goal: to reduce pain and symptoms    Objective Findings: Repeated lumbar extension caused centralization of symptoms.                   PT Progress Report       06/01/16 0700    Assessment     Assessment  "Erik Burke" had a positive response to repeated lumbar extension as indicated by statements of complete relief of buttock symptoms with centralization. Plan on on reviewing symptoms at next apt and if he is responding to repeated extension we will begin to focus on lumbar spine stabilization exercises.     Rehab Potential Good      06/01/16 0700    Outpatient Treatment Plan    OP Treatment Frequency 1 time per week    OP Treatment Duration 8 weeks      06/01/16 0700    Treatment Plan Discussion    Treatment Plan Discussion & Agreement Patient    Patient/Family Questions Yes - All questions asked & answered      06/01/16 0700    Patient/Family Education    Learner(s) Patient    Patient/family training in appropriate therapeutic interventions Ongoing      06/01/16 0700    Subjective    Status since last treatment  states  that in the morning symptoms are worse and when he does a valsalva it is worse. there is a burning sensation that accompanies the dull ache.       06/01/16 0700    Outpatient Pain Assessment    OP Pain Pain Characteristics;Numeric Pain Rating Scale      06/01/16 0700    Numeric Pain Rating Scale     Pain Intensity - rating at present 3    Pain Intensity - rating at worst  6    Frequency  Constant    Description  Sharp;Dull;Aching    Location  Coccyx and down into R LE    Pain Description pulsing, throbbing      06/01/16 0700    Outpatient Pain Characteristics    Exacerbating Factors Other (comments) getting  into and out of the car; sitting or standing for too long    Relieving Factors - better with movement      06/01/16 0700    Activity Restrictions    Activity Restrictions None      06/01/16 0700    Treatment provided today    Payor  Medicare/Private      06/01/16 0700    Therapeutic Procedures    Therapeutic exercise  ([id number])  Patient education;Range of motion exercises;Strengthening exercises       Total TIMED Treatment (min)  30      Therapeutic exercise   Review of symptoms and home exercise program   Piriformis stretch  Education on valsalva and disc anatomy including IVD and disc bulges  Prone on elbows x60"  Prone on elbows press up x15 slow and controlled   (added to home exercise program)    Standing lumbar extension against wall.       06/01/16 0700  Focus for Next Treatment    Focus for Next Treatment Manual therapy;Therapeutic exercise      06/01/16 0700    Treatment Time     Total TIMED Treatment  (min) 30    Total Treatment Time (min) 30

## 2016-06-06 NOTE — Telephone Encounter (Addendum)
Spoke to PT: he saw Dr. Claudette Laws on 05/29/16. Dr. Trinna Post was going to talk to Dr. Pearline Cables at [facility] regarding pt's ESI and frequency of dural tears. Pt calling today because he has not heard from Dr. Creola Corn office for plan of care. I have called and left message for Dr. Trinna Post.

## 2016-06-06 NOTE — Telephone Encounter (Signed)
Ardell, Triage Nurse, is calling back regarding the previous message. Per Ardell, patient is calling regarding the discussion between Dr Trinna Post and Dr Pearline Cables from [place]. Per Ardell, patient wishes to proceed with any procedure recommendations made during his visit on 2/13. Patient is requesting a call back. Please advise.

## 2016-06-08 ENCOUNTER — Telehealth (INDEPENDENT_AMBULATORY_CARE_PROVIDER_SITE_OTHER): Payer: Self-pay | Admitting: Internal Medicine

## 2016-06-08 ENCOUNTER — Ambulatory Visit (INDEPENDENT_AMBULATORY_CARE_PROVIDER_SITE_OTHER): Payer: Commercial Managed Care - HMO | Admitting: Physical Therapy

## 2016-06-08 DIAGNOSIS — M961 Postlaminectomy syndrome, not elsewhere classified: Secondary | ICD-10-CM

## 2016-06-08 DIAGNOSIS — G8929 Other chronic pain: Principal | ICD-10-CM

## 2016-06-08 DIAGNOSIS — M549 Dorsalgia, unspecified: Principal | ICD-10-CM

## 2016-06-08 DIAGNOSIS — M533 Sacrococcygeal disorders, not elsewhere classified: Principal | ICD-10-CM

## 2016-06-08 NOTE — Telephone Encounter (Signed)
Referral Request    Name of PCP Provider: [name]   Insurance Coverage Verified: Active  Who is requesting the referral? Patient   What type of referral is being requested? Dr. Sherrye Payor, spine specialist (Orthopedic surgeon title)  Reason for request? 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 Sherlynn Stalls recommended Spine Specialist Dr. Sherrye Payor. Also, pt states he is currently seeing Samul Dada PT, and on a separate occasion, based on his symptom presentation, Corene Cornea recommended Dr. Sherrye Payor. He also states he would like to pass on that Jason's most recent notes are in his chart and had office visit today. Dr. Elesa Massed phone: [phone]  Has this issue been discussed with provider? yes  Last office visit: 05/16/2016   Next office visit: 08/06/2016   Best way to contact patient: [phone] (mobile)   Alternative communication method: [phone] (mobile)    Has been advised this message will be transmitted to office and can expect a response within the next 24-72 hours.

## 2016-06-08 NOTE — Telephone Encounter (Signed)
Yes I did speak to Dr. Pearline Cables.  Reviewed with Rickard Patience and he passed info to pt.

## 2016-06-08 NOTE — Interdisciplinary (Signed)
Physical Therapy Daily Follow up Note    Referring Physician: Danae Chen               Preferred [place]  Patient to continue therapy for     ICD-10-CM ICD-9-CM    1. Coccygeal pain, chronic M53.3 724.79     G89.29 338.29        Patient stated goal: to reduce pain and symptoms    Objective Findings: relief of symptoms with extension, no symptom reproduction while standing  Positive symptoms with slump test   Good response to neural flossing/slides                PT Progress Report       06/08/16 0700    Assessment     Assessment  "Erik Burke" continues to have symptoms of radicular nature which are reproduced with forward flexion, reduced with extension, and provoked with neural tension testing in "slump" position. We had thorough discussions on his bodies tendency to enjoy lumbar extension over flexion and made the decision to attempt all strengthening and mobility procedures in standing. We performed neural glides as well today. Plan on assessing response to neural glides and progress standing core exercises.     Rehab Potential Good      06/08/16 0700    Outpatient Treatment Plan    OP Treatment Frequency 1 time per week    OP Treatment Duration 8 weeks      06/08/16 0700    Treatment Plan Discussion    Treatment Plan Discussion & Agreement Patient    Patient/Family Questions Yes - All questions asked & answered      06/08/16 0700    Subjective    Status since last treatment  states that he was able to have nearly complete relief from his back related symptoms when he performs his extension exercises but then has a reproduction of pain when he sits down or goes into forward flexion. symptom relief is maintained when he stands after performing extensions.       06/08/16 0700    Outpatient Pain Assessment    OP Pain Pain Characteristics;Numeric Pain Rating Scale      06/08/16 0700    Numeric Pain Rating Scale     Pain Intensity - rating at present 3    Pain  Intensity - rating at worst  6    Frequency  Constant    Description  Sharp;Dull;Aching    Location  Coccyx and down into R LE    Pain Description pulsing, throbbing      06/08/16 0700    Outpatient Pain Characteristics    Exacerbating Factors Other (comments) getting into and out of the car; sitting or standing for too long    Relieving Factors - better with movement      06/08/16 0700    Activity Restrictions    Activity Restrictions None      06/08/16 0700    Treatment provided today    Payor  Medicare/Private      06/08/16 0700    Therapeutic Procedures    Therapeutic exercise  (97110)  Patient education;Range of motion exercises;Strengthening exercises       Total TIMED Treatment (min)  40      Therapeutic exercise   Lumbar extension with symptom reduction  Standing core series with walk outs x8 and rotations x8 each   Ball press with marches x10   Ball press isometric rotation x10  Neural slides standing x10       06/08/16 0700    Focus for Next Treatment    Focus for Next Treatment Manual therapy;Therapeutic exercise      06/08/16 0700    Treatment Time     Total TIMED Treatment  (min) 45    Total Treatment Time (min) 40

## 2016-06-10 ENCOUNTER — Encounter (INDEPENDENT_AMBULATORY_CARE_PROVIDER_SITE_OTHER): Payer: Self-pay | Admitting: Orthopaedic Surgery

## 2016-06-10 DIAGNOSIS — R52 Pain, unspecified: Principal | ICD-10-CM

## 2016-06-11 ENCOUNTER — Encounter (INDEPENDENT_AMBULATORY_CARE_PROVIDER_SITE_OTHER): Payer: Self-pay

## 2016-06-11 NOTE — Telephone Encounter (Signed)
From: [name]  To: Denzil Hughes  Sent: 06/10/2016 3:09 PM PST  Subject: 20-Other    Hi Dr. Radene Knee. Per referral, I had aptmnt w/ pain mngment on 2/13. I haven't heard from them, despite numerous attempts at contact. Given lack of clarity w/ dx, I conducted brief lit review (not online search). 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 I'm not concluding sarcoma pathology; I am pointing out sx overlap given recent r/o of more common dxs by you, [place], Halter, [place].    See Charlotta Newton 2007, Jeys et al 2007 + 2008. Drop et al 2014, [place] 2000 have larger n. If mean duration sxs prior to dx is 2 yrs, compression/neurological sxs may emerge only after substantial sarcoma growth. Other sxs are present before growth, such as those I experience now. Intensity/frequency of pain in coccyx/sacrum have inc since onset (Spr '17). Characteristic pattern of pain/sxs include dull, worse w/ sitting, night, leg, buttock; saddle anesthesia-like sxs (perianal/perineum burning); failed to resolve "back" pain w/ coccydynia (+ sacrum); some paresthesia. Pain reproducible by laying on side/stomach + tightening abdomen.    Delayed dx + misdx common for sarcoma. Recent MRI not possible d/t coil. I've had recent pelvic X-ray + R pelvis CT, wondering about other/more robust sacrum/coccyx imaging (CT, xray w/ higher res, greater anatomical specificity). What can be done for greater dx clarity? [place] have rec'd Sherrye Payor, when is referral possible? What less-common r/o can be addressed?    Thanks. Erik Bun, PhD

## 2016-06-11 NOTE — Telephone Encounter (Signed)
Note sent:    Thank you for your note.    I will forward your concern to Dr Trinna Post, who from the medical records has been busy with phone calls regarding your case since the time of your last visit on 05-29-16.    Dr Tally Due is a Licensed conveyancer. I reviewed your case with his nurse practitioner, and as of this time, we did not think Dr Tally Due could offer you a surgical option. However if you would like to arrange a formal consultation to see him in person, I am happy to arrange that for you. Please let me know.    As to the sarcoma concern, it is not something I'm personally that experienced with. We have a specialist in [place], Dr Roosvelt Maser, and I'm sure she would be happy to review your situation and make recommendations.    Let me know how I can help.    Best,  Dr Radene Knee

## 2016-06-11 NOTE — Telephone Encounter (Signed)
Routed to MD to sign Order is agreeable.

## 2016-06-13 ENCOUNTER — Telehealth (HOSPITAL_BASED_OUTPATIENT_CLINIC_OR_DEPARTMENT_OTHER): Payer: Self-pay

## 2016-06-13 ENCOUNTER — Encounter (INDEPENDENT_AMBULATORY_CARE_PROVIDER_SITE_OTHER): Payer: Self-pay

## 2016-06-13 ENCOUNTER — Telehealth (HOSPITAL_BASED_OUTPATIENT_CLINIC_OR_DEPARTMENT_OTHER): Payer: Self-pay | Admitting: Orthopaedic Surgery

## 2016-06-13 NOTE — Telephone Encounter (Signed)
Left pt voicemail for a appt for patient with sarcoma concerns, visit with Dr Salomon Fick desired.

## 2016-06-13 NOTE — Telephone Encounter (Signed)
New pt calling to request consult with Dr. Salomon Fick. Internal referral indicates: patient with sarcoma concerns, visit with Dr Salomon Fick desired.    Requesting a call back to schedule asap.

## 2016-06-15 ENCOUNTER — Ambulatory Visit (INDEPENDENT_AMBULATORY_CARE_PROVIDER_SITE_OTHER): Payer: Commercial Managed Care - HMO | Admitting: Physical Therapy

## 2016-06-15 DIAGNOSIS — G8929 Other chronic pain: Principal | ICD-10-CM

## 2016-06-15 DIAGNOSIS — M533 Sacrococcygeal disorders, not elsewhere classified: Principal | ICD-10-CM

## 2016-06-15 NOTE — Interdisciplinary (Signed)
Physical Therapy Daily Follow up Note    Referring Physician: Danae Chen               Preferred [place]  Patient to continue therapy for     ICD-10-CM ICD-9-CM    1. Coccygeal pain, chronic M53.3 724.79     G89.29 338.29        Patient stated goal: to reduce pain and symptoms    Objective Findings: Posture sitting in lobby poor, slouched posture, load placed on lumbar spine                   PT Progress Report       06/15/16 0700    Assessment     Assessment  "Erik Burke" has responded well to physical therapy with relation to his leg radicular symptoms but has not seen major improvements in his sacral symptoms and rather the burning has been increasing. He continues to work with additional interdisciplinary team members and will seek a consultation with Dr. Selinda Orion soon. We will continue physical therapy to work on core and gluteal strengthening while monitoring his symptoms.     Rehab Potential Good      06/15/16 0700    Outpatient Treatment Plan    OP Treatment Frequency 1 time per week    OP Treatment Duration 8 weeks      06/15/16 0700    Treatment Plan Discussion    Treatment Plan Discussion & Agreement Patient    Patient/Family Questions Yes - All questions asked & answered      06/15/16 0700    Subjective    Status since last treatment  states that the nerve symptoms traveling down the leg have significant improved, he hasn't felt htem all week. On the opposite side, although, his sacral symptoms in the buttock hace remained the same or even worsened. This could be related to symptom centralization, versus peripheralization, which would be an improvement.       06/15/16 0700    Outpatient Pain Assessment    OP Pain Pain Characteristics;Numeric Pain Rating Scale      06/15/16 0700    Numeric Pain Rating Scale     Pain Intensity - rating at present 3    Pain Intensity - rating at worst  6    Frequency  Constant    Description  Sharp;Dull;Aching    Location   Coccyx and down into R LE    Pain Description pulsing, throbbing      06/15/16 0700    Outpatient Pain Characteristics    Exacerbating Factors Other (comments) getting into and out of the car; sitting or standing for too long    Relieving Factors - better with movement      06/15/16 0700    Activity Restrictions    Activity Restrictions None      06/15/16 0700    Treatment provided today    Payor  Medicare/Private      06/15/16 0700    Therapeutic Procedures    Therapeutic exercise  (97110)  Patient education;Range of motion exercises;Strengthening exercises       Total TIMED Treatment (min)  40      Therapeutic exercise   Patient education on current symptoms, centralization versus peripheralization, Sacral and Lumbar anatomy   3 way core series with pulleys (punches, chops, side steps, 20,20,3)       06/15/16 0700    Focus for Next Treatment    Focus for  Next Treatment Manual therapy;Therapeutic exercise      06/15/16 0700    Treatment Time     Total TIMED Treatment  (min) 45    Total Treatment Time (min) 40

## 2016-06-18 ENCOUNTER — Other Ambulatory Visit (INDEPENDENT_AMBULATORY_CARE_PROVIDER_SITE_OTHER): Payer: Self-pay | Admitting: Internal Medicine

## 2016-06-18 DIAGNOSIS — G4750 Parasomnia, unspecified: Principal | ICD-10-CM

## 2016-06-18 NOTE — Telephone Encounter (Signed)
Medication requested:   Requested Prescriptions     Pending Prescriptions Disp Refills    clonazePAM (KLONOPIN) 0.5 MG tablet 60 tablet 1     Sig: 1 mg po QHS (2 tabs) and 0.5mg  QAM (1 tab)       Date of last refill: 01.31.18    Last OV (provider):      05/16/2016  Last OV (department): 05/16/2016    Next OV (provider):      08/06/2016  Next OV (department): 08/06/2016

## 2016-06-19 ENCOUNTER — Telehealth (INDEPENDENT_AMBULATORY_CARE_PROVIDER_SITE_OTHER): Payer: Self-pay | Admitting: Pain Medicine

## 2016-06-19 ENCOUNTER — Encounter (INDEPENDENT_AMBULATORY_CARE_PROVIDER_SITE_OTHER): Payer: Self-pay | Admitting: Physician Assistant

## 2016-06-19 MED ORDER — CLONAZEPAM 0.5 MG OR TABS
ORAL_TABLET | ORAL | 1 refills | Status: DC
Start: 2016-06-19 — End: 2016-07-20

## 2016-06-19 NOTE — Telephone Encounter (Signed)
Message left with patient. Spoke to Dr. Trinna Post and agreed that patient should follow up with Dr. Trinna Post for re-evaluation.

## 2016-06-19 NOTE — Telephone Encounter (Signed)
Patient is calling back regarding the previous encounter on 05/31/16. Per patient, he is yet to be contacted. He is requesting a call back for further instruction at ph#[phone]. Per patient, it is OK to leave a message. Please advise.

## 2016-06-20 ENCOUNTER — Telehealth: Payer: Self-pay

## 2016-06-20 ENCOUNTER — Telehealth (INDEPENDENT_AMBULATORY_CARE_PROVIDER_SITE_OTHER): Payer: Self-pay | Admitting: Orthopaedic Surgery

## 2016-06-20 NOTE — Telephone Encounter (Signed)
Patient calling in to for Erik Burke to request more PT visits as patients referral expires on 07/02/16. Patient was last seen on 06/15/16 at [facility].    Routing to [place].

## 2016-06-20 NOTE — Telephone Encounter (Signed)
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 Radene Knee.     Referral made out to Ortho Oncology.     Patient states he was sent back to Orthopaedics for this issue when he contacted the Oncology Department.     Please review referral and advise if patient is to see Dr. Salomon Fick through Oncology or Orthopaedics.

## 2016-06-20 NOTE — Telephone Encounter (Signed)
RN routed to PA for scheduling input.

## 2016-06-22 ENCOUNTER — Encounter (INDEPENDENT_AMBULATORY_CARE_PROVIDER_SITE_OTHER): Payer: Self-pay

## 2016-06-22 ENCOUNTER — Ambulatory Visit (INDEPENDENT_AMBULATORY_CARE_PROVIDER_SITE_OTHER): Payer: Commercial Managed Care - HMO | Admitting: Physical Therapy

## 2016-06-22 ENCOUNTER — Encounter (INDEPENDENT_AMBULATORY_CARE_PROVIDER_SITE_OTHER): Payer: Self-pay | Admitting: Orthopaedic Surgery

## 2016-06-22 DIAGNOSIS — C499 Malignant neoplasm of connective and soft tissue, unspecified: Principal | ICD-10-CM

## 2016-06-26 ENCOUNTER — Encounter (HOSPITAL_BASED_OUTPATIENT_CLINIC_OR_DEPARTMENT_OTHER): Payer: Self-pay | Admitting: Pain Medicine

## 2016-06-26 ENCOUNTER — Ambulatory Visit (HOSPITAL_BASED_OUTPATIENT_CLINIC_OR_DEPARTMENT_OTHER): Payer: Commercial Managed Care - HMO | Admitting: Physical Therapy

## 2016-06-26 ENCOUNTER — Ambulatory Visit: Payer: Commercial Managed Care - HMO | Attending: Pain Medicine | Admitting: Pain Medicine

## 2016-06-26 VITALS — BP 128/83 | HR 74 | Temp 97.5°F | Resp 16

## 2016-06-26 DIAGNOSIS — M961 Postlaminectomy syndrome, not elsewhere classified: Principal | ICD-10-CM | POA: Insufficient documentation

## 2016-06-27 NOTE — Progress Notes (Signed)
PAIN NEW CONSULT NOTE    S:  Pt with continued low back pain but difficult to quantitate.  Not certain how he wants to proceed.  If proceed with lumbar esi would want to proceed with CT guidance.       Pain not consistent pattern.  Concerned that he may have new dural headache.    Working with physical therapy.  Using pillow to sit.    Review of Systems   Constitutional: Negative.    Musculoskeletal: Positive for back pain.   Neurological: Positive for dizziness.   Psychiatric/Behavioral: Negative.           Past Medical History:   Diagnosis Date    Acne     Back pain     secondary to herniated disc, from back injury in 2000    Hyperhidrosis     propanolol tx    Hyperlipidemia     LDL > 200, TG ~400 at Dx, statin intolerant.  Per pt, r/o for familial HDL    Parasomnia associated with sexual behavior     Tx with Caprice Renshaw, has been being downtitrated       Past Surgical History:   Procedure Laterality Date    epidural steroid injection      x5 ESIs, most ecent 2017    l3/4 laminectomy  2014    @ [facility], 2014       Social History     Social History    Marital status: Married     Spouse name: N/A    Number of children: N/A    Years of education: N/A     Occupational History    Not on file.     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      clonazePAM (KLONOPIN WAFER) 0.25 MG disintegrating tablet Take 1 tablet (0.25 mg) by mouth at bedtime. 30 tablet 0    ClonazePAM 0.125 MG TBDP Take 1 tablet by mouth every morning (before breakfast). 30 tablet 0    propranolol (INDERAL LA) 60 MG CP24 Take 1 capsule (60 mg) by mouth daily. 90 each 1    tretinoin (RETIN-A) 0.025 % cream Use a pea sized amount on the entire face nightly.  At first, you may use every other night to avoid dryness  and irritation, and gradually increase use to nightly as tolerated 45 g 3     No current facility-administered medications for this visit.        No Known Allergies    Physical Exam:   Vitals: BP 128/83 (BP Location: Right arm, BP Patient Position: Sitting, BP cuff size: Regular)   Pulse 74   Temp 97.5 F (36.4 C) (Oral)   Resp 16      Physical Exam   Constitutional: He is well-developed, well-nourished, and in no distress.   Eyes: Pupils are equal, round, and reactive to light.   Neck: Normal range of motion.   Cardiovascular: Normal rate.    Pulmonary/Chest: Effort normal.   Abdominal: Soft.   Musculoskeletal:        Lumbar back: He exhibits decreased range of motion and tenderness.   No tender over coccyx    Neurological: He has normal sensation, normal strength and normal reflexes. He has a normal Straight Leg Raise Test. Gait normal.       ASSESSMENT:    Encounter Diagnoses   Name Primary?    Postlaminectomy syndrome of lumbar region Yes       PLAN:  1.  Would not recommend lumbar esi without radicular sx at this time.    2.  More than 15 min was spent with the patient today, of which more than 50% was spent in counseling regarding the diagnosis, prognosis, further diagnostic and therapeutic options and coordinating this care.  There were no barriers to learning.

## 2016-07-20 ENCOUNTER — Ambulatory Visit (INDEPENDENT_AMBULATORY_CARE_PROVIDER_SITE_OTHER): Payer: Commercial Managed Care - HMO | Admitting: Internal Medicine

## 2016-07-20 VITALS — BP 120/70 | HR 75 | Temp 97.7°F | Resp 20 | Wt 171.0 lb

## 2016-07-20 DIAGNOSIS — R51 Headache: Principal | ICD-10-CM

## 2016-07-20 DIAGNOSIS — R519 Headache, unspecified: Secondary | ICD-10-CM

## 2016-07-20 DIAGNOSIS — G4750 Parasomnia, unspecified: Secondary | ICD-10-CM

## 2016-07-20 DIAGNOSIS — L74519 Primary focal hyperhidrosis, unspecified: Secondary | ICD-10-CM

## 2016-07-20 DIAGNOSIS — R61 Generalized hyperhidrosis: Secondary | ICD-10-CM

## 2016-07-20 MED ORDER — PROPRANOLOL HCL CR 60 MG OR CP24
60.0000 mg | ORAL_CAPSULE | Freq: Every day | ORAL | 1 refills | Status: DC
Start: 2016-07-20 — End: 2017-04-25

## 2016-07-20 MED ORDER — CLONAZEPAM ODT 0.125 MG OR TBDP
1.0000 | ORAL_TABLET | Freq: Every day | ORAL | 0 refills | Status: DC
Start: 2016-07-20 — End: 2016-08-27

## 2016-07-20 MED ORDER — CLONAZEPAM ODT 0.25 MG OR TBDP
0.2500 mg | ORAL_TABLET | Freq: Every evening | ORAL | 0 refills | Status: DC
Start: 2016-07-20 — End: 2016-09-17

## 2016-07-20 MED ORDER — ACETAZOLAMIDE 250 MG OR TABS
250.00 mg | ORAL_TABLET | Freq: Every day | ORAL | Status: DC | PRN
Start: 2016-07-20 — End: 2018-01-17

## 2016-07-20 NOTE — Patient Instructions (Addendum)
Try the new tabs with the doses - let me know if the tablets are better and I can change the script    Meet with pain management and ortho-spine

## 2016-07-20 NOTE — Progress Notes (Signed)
Primary Care FOLLOW UP VISIT    Routine Follow Up Appointment: PRIMARY CARE    Erik Burke is a 36 year old year old male with a who comes in for routine follow up  The patient has presents for Other  36 YO man with a history of HLD, parasomnia, hyperhidrosis, and chronic coccyx/sacral pain w/ radiation (followed by pain management) presents to followup    1. Sleep / parasomnia - weaned down to 0.'25mg'$  qHS and 0.125 qAM.  No epsides of parasomnia since decreasing dose but does note wife is gravid.  Overall doing well  2. Pain - seeing Dr. Corrinne Eagle on Monday; considering alteratives.  Pain very cyclical.  Doing PT, exercise, coccyx pillow, no general change to pain  3. Hyperhydrosis - no issues with propranolol - interested in refill  4. Reports rare HA needing Diamox 2/2 previous ?dural injury causing episodic intracranial hypertension - no current symptoms, very rarely gets HA.      First kid (does not know gender) coming later this month     Review of Systems:  Review of Systems   Musculoskeletal: Positive for back pain and joint pain. Negative for falls and myalgias.   Neurological: Negative for dizziness, tingling, tremors, sensory change, focal weakness, loss of consciousness and headaches.   Psychiatric/Behavioral: Negative for depression. The patient is not nervous/anxious and does not have insomnia.        Past Medical History:  Patient Active Problem List   Diagnosis    Hyperlipidemia    Back pain    Parasomnia    Acne, unspecified acne type    Hyperhidrosis    Dural tear    Postlaminectomy syndrome of lumbar region       Medications were reviewed and updated on the Active Medication List    Medications:    Current Outpatient Prescriptions:     acetaZOLAMIDE (DIAMOX) 250 MG tablet, Take 1 tablet (250 mg) by mouth daily as needed (headache)., Disp: , Rfl:     clonazePAM (KLONOPIN WAFER) 0.25 MG disintegrating tablet, Take 1 tablet (0.25 mg) by mouth at bedtime., Disp: 30 tablet, Rfl: 0     ClonazePAM 0.125 MG TBDP, Take 1 tablet by mouth every morning (before breakfast)., Disp: 30 tablet, Rfl: 0    propranolol (INDERAL LA) 60 MG CP24, Take 1 capsule (60 mg) by mouth daily., Disp: 90 each, Rfl: 1    tretinoin (RETIN-A) 0.025 % cream, Use a pea sized amount on the entire face nightly.  At first, you may use every other night to avoid dryness and irritation, and gradually increase use to nightly as tolerated, Disp: 45 g, Rfl: 3    Psychosocial:  Social History     Social History    Marital status: Married     Spouse name: N/A    Number of children: N/A    Years of education: N/A     Occupational History    Not on file.     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       No  Known Allergies    The Family History:  Family History   Problem Relation Age of Onset    Cancer Mother      cervical    No Known Problems Father     Cancer Maternal Grandmother      ovarian    No Known Cancer Maternal Grandfather     Heart Disease Maternal Grandfather     Cancer Paternal Grandmother      pancreatic    Cancer Paternal Grandfather      mesothelioma    Cancer Brother 17     osteosarcoma       Physical Exam:  BP 120/70 (BP Location: Right arm, BP Patient Position: Sitting, BP cuff size: Regular)   Pulse 75   Temp 97.7 F (36.5 C) (Oral)   Resp 20   Wt 77.6 kg (171 lb)   SpO2 98%   BMI 26 kg/m2  Body mass index is 26 kg/(m^2).  Physical Exam   Constitutional: He is well-developed, well-nourished, and in no distress.   HENT:   Head: Normocephalic and atraumatic.   Eyes: Conjunctivae are normal. No scleral icterus.   Cardiovascular: Normal rate.    No murmur heard.  Pulmonary/Chest: Effort normal. He has no wheezes. He  has no rales.   Musculoskeletal: Normal range of motion.   Neurological: He is alert.   Skin: Skin is warm.   Psychiatric: Mood and affect normal.   Vitals reviewed.      Studies  Lab Results   Component Value Date    BUN 10 02/11/2016    CREAT 1.13 02/11/2016    CL 102 02/11/2016    NA 142 02/11/2016    K 4.1 02/11/2016    [place] 9.7 02/11/2016    TBILI 0.59 02/11/2016    ALB 4.8 02/11/2016    TP 7.3 02/11/2016    AST 19 02/11/2016    ALK 61 02/11/2016    BICARB 28 02/11/2016    ALT 21 02/11/2016    GLU 93 02/11/2016     No results found for: A1C  Lab Results   Component Value Date    AST 19 02/11/2016    ALT 21 02/11/2016    ALK 61 02/11/2016    TP 7.3 02/11/2016    ALB 4.8 02/11/2016    TBILI 0.59 02/11/2016     Lab Results   Component Value Date    CHOL 231 02/11/2016    HDL 37 02/11/2016    LDLCALC 150 02/11/2016    TRIG 221 02/11/2016     No results found for: GFRAA  Lab Results   Component Value Date    WBC 4.4 02/11/2016    RBC 5.04 02/11/2016    HGB 15.5 02/11/2016    HCT 44.9 02/11/2016    MCV 89.1 02/11/2016    RDW 11.5 02/11/2016    PLT 245 02/11/2016    LYMPHS 42 02/11/2016    MONOS 10 02/11/2016    EOS 6 02/11/2016    BASOS 1 02/11/2016     No results found for: INR  No results found for: COLORUA, APPEARUA, [place], BILIUA, KETONEUA, [place], BLOODUA, [place], PROTEINUA, UROBILUA, NITRITEUA, LEUKESTUA, WBCUA, RBCUA, EPITHCELLSUA, HYALINEUA, CRYSTALSUA, COMMENTSUA    The ASCVD Risk score Mikey Bussing DC Jr, et al., 2013) failed to calculate for the following reasons:    The 2013 ASCVD risk score is only valid for ages 17 to 9      Imaging: NA      Health Maintenance:  was reviewed, key updates NA    ASSESSMENT AND PLAN  Erik Burke is a 36 year old year old male with a who comes in for routine follow up    Diagnoses and all orders for this visit:    Nonintractable episodic headache, unspecified headache type - Unclear but complicated surgical history - no current symptoms - if worsening, appropriate to  re-image / NSGY c/s  -     acetaZOLAMIDE (DIAMOX) 250 MG tablet; Take 1 tablet (250 mg) by mouth daily as needed (headache).    Parasomnia - Pt has successfully been weaning benzo - commended on this - cont clonazepam for now  -     ClonazePAM 0.125 MG TBDP; Take 1 tablet by mouth every morning (before breakfast).  -     clonazePAM (KLONOPIN WAFER) 0.25 MG disintegrating tablet; Take 1 tablet (0.25 mg) by mouth at bedtime.    Hyperhidrosis - Stable sx / good control  -     propranolol (INDERAL LA) 60 MG CP24; Take 1 capsule (60 mg) by mouth daily.        Patient Instructions   Try the new tabs with the doses - let me know if the tablets are better and I can change the script    Meet with pain management and ortho-spine          Orders this encounter:  Lab No orders of the defined types were placed in this encounter.    Imaging No orders of the defined types were placed in this encounter.    Procedures No orders of the defined types were placed in this encounter.    Other No orders of the defined types were placed in this encounter.      RETURN TO CLINIC INSTRUCTIONS  Return to clinic in 15months) for followup    .

## 2016-07-21 ENCOUNTER — Encounter (INDEPENDENT_AMBULATORY_CARE_PROVIDER_SITE_OTHER): Payer: Self-pay | Admitting: Internal Medicine

## 2016-07-21 DIAGNOSIS — E785 Hyperlipidemia, unspecified: Principal | ICD-10-CM

## 2016-07-23 ENCOUNTER — Other Ambulatory Visit (INDEPENDENT_AMBULATORY_CARE_PROVIDER_SITE_OTHER): Payer: Commercial Managed Care - HMO | Attending: Internal Medicine

## 2016-07-23 ENCOUNTER — Ambulatory Visit: Payer: Commercial Managed Care - HMO | Attending: Orthopaedic Surgery | Admitting: Orthopaedic Surgery

## 2016-07-23 ENCOUNTER — Encounter (HOSPITAL_BASED_OUTPATIENT_CLINIC_OR_DEPARTMENT_OTHER): Payer: Self-pay

## 2016-07-23 ENCOUNTER — Encounter (INDEPENDENT_AMBULATORY_CARE_PROVIDER_SITE_OTHER): Payer: Self-pay | Admitting: Internal Medicine

## 2016-07-23 VITALS — BP 115/79 | HR 73 | Temp 98.1°F | Ht 68.0 in | Wt 170.0 lb

## 2016-07-23 DIAGNOSIS — D492 Neoplasm of unspecified behavior of bone, soft tissue, and skin: Secondary | ICD-10-CM | POA: Insufficient documentation

## 2016-07-23 DIAGNOSIS — E785 Hyperlipidemia, unspecified: Principal | ICD-10-CM | POA: Insufficient documentation

## 2016-07-23 DIAGNOSIS — C499 Malignant neoplasm of connective and soft tissue, unspecified: Secondary | ICD-10-CM | POA: Insufficient documentation

## 2016-07-23 LAB — CREATININE W/GFR: Creatinine: 1.13 mg/dL (ref 0.67–1.17)

## 2016-07-23 LAB — LIPID(CHOL FRACT) PANEL, BLOOD
Cholesterol: 272 mg/dL (ref <200)
HDL-Cholesterol: 38 mg/dL
LDL-Chol (Calc): INVALID mg/dL (ref <160)
Non-HDL Cholesterol: 234 mg/dL
Triglycerides: 482 mg/dL — ABNORMAL HIGH (ref 10–170)

## 2016-07-23 LAB — GFR: GFR: >60 mL/min

## 2016-07-23 NOTE — Telephone Encounter (Signed)
Routed to MD to Advise/Sign Pended Orders if agreeable.

## 2016-07-23 NOTE — Progress Notes (Signed)
[place] [facility]GERY    Demographics:  Date: July 23, 2016    Patient Name: Erik Burke   Medical Record #: [id number]   DOB: [birth year]  Age: 36 year old  Sex: male    Consultation requesting physician:  Denzil Hughes    Primary Care Physician:  [name]    Dear Dr Radene Knee,    Reason for visit: Consultation for sacral / coccyx pain    Translator needed:  no    History of present Illness:   Erik Burke is a 36 year old male with a history of coccyx and sacral pain which radiates to the right thigh since Spring 2017.  Does not recall specific injury.  He states the pain can vary depending on position.  He especially has pain when sitting for long periods.  His pain will also worsen with sneezing or Valsalva.  He has had ESIs in the past as well as PT.    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  He also mentions he sustained an iliac crest fracture when he played soccer.  (See his self typed details of his pain and history scanned into media).  He has been seen by Dr Radene Knee as well as Dr Trinna Post.  He comes in today for further evaluation.    Pain:  location: sacrum / coccyx  severity: 2-8/10    Previous Treatment:  As per HPI    Review of Systems:  Constitutional: No weight loss, fevers or chills.  HEENT: No headaches. No vision change. No hearing change. No epistaxis, throat clear.  Psychiatric: No mood change  Neurologic: No loss of consciousness  Cardiovascular: No chest pain, tachycardia, dyspnea on exertion  Respiratory: No shortness of breath, no cough.  GI: No jaundice, hematemesis, abdominal pain, diarrhea or constipation  GU: No dysuria or hematuria  MSK: As per HPI  Endocrine: No abnormalities  Heme/Lymphatic: No bleeding, anemia or lymphadenopathy  Allergy/Immunology: No new findings, no infections    Current Medications:    acetaZOLAMIDE (DIAMOX) 250 MG tablet Take 1 tablet (250 mg) by mouth  daily as needed (headache).   clonazePAM (KLONOPIN WAFER) 0.25 MG disintegrating tablet Take 1 tablet (0.25 mg) by mouth at bedtime.   ClonazePAM 0.125 MG TBDP Take 1 tablet by mouth every morning (before breakfast).   propranolol (INDERAL LA) 60 MG CP24 Take 1 capsule (60 mg) by mouth daily.   tretinoin (RETIN-A) 0.025 % cream Use a pea sized amount on the entire face nightly.  At first, you may use every other night to avoid dryness and irritation, and gradually increase use to nightly as tolerated       Patient Allergies:  No Known Allergies     Past Medical History:  Past Medical History:   Diagnosis Date    Acne     Back pain     secondary to herniated disc, from back injury in 2000    Hyperhidrosis     propanolol tx    Hyperlipidemia     LDL > 200, TG ~400 at Dx, statin intolerant.  Per pt, r/o for familial HDL    Parasomnia associated with sexual behavior     Tx with Caprice Renshaw, has been being downtitrated         Past Surgical History:  Past Surgical History:   Procedure Laterality Date    epidural steroid injection  x5 ESIs, most ecent 2017    l3/4 laminectomy  2014    @ [facility], 2014        Social History:  Social History     Social History    Marital status: Married     Spouse name: N/A    Number of children: N/A    Years of education: N/A     Occupational History    Not on file.     Social History Main Topics    Smoking status: Never Smoker    Smokeless tobacco: Never Used    Alcohol use Yes      Comment: 1-3 drinks infrequently    Drug use: Not on file    Sexual activity: Not on file     Other Topics Concern    Not on file     Social History Narrative    PhD in Psychology, works in [employer], [employer]        Family History:  Family History   Problem Relation Age of Onset    Cancer Mother      cervical    No Known Problems Father     Cancer Maternal Grandmother      ovarian    No Known Cancer Maternal Grandfather     Heart Disease Maternal Grandfather     Cancer Paternal  Grandmother      pancreatic    Cancer Paternal Grandfather      mesothelioma    Cancer Brother 17     osteosarcoma       Physical Exam:  BP 115/79 (BP Location: Left arm, BP Patient Position: Sitting, BP cuff size: Regular)   Pulse 73   Temp 98.1 F (36.7 C) (Oral)   Ht 5\' 8"  (1.727 m)   Wt 77.1 kg (170 lb)   BMI 25.85 kg/m2     General Appearance:   Healthy, alert, no distress, pleasant cooperative.  Psychiatric:  Mood and affect appropriate.  HEENT: Cranial nerves intact, oropharynx clear  Neck: Supple.  Lungs: No audible wheezes.  Extremities: No cyanosis, clubbing or edema.  Skin: Skin color, texture and turgor are normal. No rashes or lesions.    Neuro: No gross sensory and motor abnormalities.  Vascular: Strong distal pulses.  Lymph: No lymphadenopathy  MSK:  He ambulates with a normal gait.  +TTP at lower sacrum / coccyx.  No palpable mass.  No pelvis pain with lateral compression of pelvis.  Pt can reproduce coccyx pain with Valsalva maneuver.  Right thigh appears slightly larger than left.    Laboratory and Radiology:  I viewed the radiographic studies today.    CT right hip from 05/24/16 shows:  No acute osseous abnormality identified.  Mild osteoarthrosis of both hips.  Mild degenerative changes at both sacroiliac joints.  Ossific spur projects from the posterior aspect of the superior lateral right iliac wing.     MRI pelvis from 05/18/16 was incomplete    Assesment:   Erik Burke has:    ICD-10-CM ICD-9-CM   1. 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    -Take Vit C 1000mg  daily  -Talk to PCP about starting celebrex.  -Consider using a donut pillow to help with pain.  -Follow up in  2 weeks to review imaging    Follow-up:  2 week(s)    The patient was seen and evaluated by Dr. Salomon Fick.  The plan was formulated by Dr. Salomon Fick and presented to the patient by her as well.        CC:   Denzil Hughes

## 2016-07-23 NOTE — Patient Instructions (Addendum)
Instructions:  -Please schedule your pelvis MRI and bone scan  -Consider using aspercream to the painful area of your pelvis.    -Take Vit C 1000mg  daily  -Talk to your PCP about starting you on celebrex.  -Consider using a donut pillow to help with pain.  -Follow up in 2 weeks to review imaging        [facility] Team Phone List  Hours of Operation:  Monday-Friday 8:00am - 5:00pm  (Closed Holidays and Weekends)  AFTER HOURS/WEEKEND EMERGENCY CONTACT NUMBER    [phone]  *Ask for the Orthopedic Physician On-Call  *Ask Resident to call Dr. Salomon Fick for advice if you are not satisfied with response    Administrative Assistant  April Tuiletufuga                                                                 Ph : [phone]          Fax: [phone]  Providers:  Roosvelt Maser, MD, Orthopedic Oncologist and Orthopedic Surgeon  Eloy End, PA-C, Physician Assistant    Sofie Hartigan, RN, Nurse Case Manager  Ph: [phone]       Margaretmary Eddy, LCSW, Social Worker for Oncology  Ph: [phone]            Additional Numbers:  Billing Office       [phone]  [email or web address]    Development worker, community for [employer]   [place]     [phone]    Medical Records     [phone]    PET Scan Scheduling     [phone]    Radiology      [phone]    Radiation Oncology      [phone]    Registration (to update personal information) [phone]    [facility]  [address], [place] [address]

## 2016-07-23 NOTE — Telephone Encounter (Signed)
From: [name]  To: [name], MD  Sent: 07/21/2016 2:43 PM PDT  Subject: 2-Procedural Question    Hello Dr. Selinda Eon, I forgot to ask you about this during the appointment on Friday: last year we discussed having a lipid panel completed in order to obtain updated cholesterol data. Would it be possible to have that ordered sometime between now and the next appointment with you? Thanks! -[name]

## 2016-07-23 NOTE — Interdisciplinary (Signed)
Alert, ambulatory with easy respirations.  Medication list reviewed and updated.  Dr. Salomon Fick and ortho oncology team evaluated patient.

## 2016-07-24 ENCOUNTER — Encounter (INDEPENDENT_AMBULATORY_CARE_PROVIDER_SITE_OTHER): Payer: Self-pay | Admitting: Internal Medicine

## 2016-07-24 DIAGNOSIS — E785 Hyperlipidemia, unspecified: Principal | ICD-10-CM

## 2016-07-24 NOTE — Telephone Encounter (Signed)
From: [name]  To: [name], MD  Sent: 07/23/2016 7:57 PM PDT  Subject: 2-Procedural Question    Thank you!  ----- Message -----  From: Stefan Church  Sent: 07/23/2016 3:02 PM PDT  To: Erik Apley. [place]  Subject: RE: 2-Procedural Question    Rodman Key,   Dr. Selinda Eon ordered the Blood Test ( Lipid Panel) for you.  Any questions or concerns, let us know!  Thank you!  -Douglass Rivers       ----- Message -----   From: Erik Burke   Sent: 07/21/2016 2:43 PM PDT   To: [name], MD  Subject: 2-Procedural Question    Hello Dr. Selinda Eon, I forgot to ask you about this during the appointment on Friday: last year we discussed having a lipid panel completed in order to obtain updated cholesterol data. Would it be possible to have that ordered sometime between now and the next appointment with you? Thanks! -Erik Burke

## 2016-07-24 NOTE — Telephone Encounter (Signed)
From: [name]  To: [name], MD  Sent: 07/23/2016 8:43 PM PDT  Subject: 2-Procedural Question    Hello Dr. Selinda Eon,    Thank you for ordering the lipid panel. One more question. I met with Dr. Salomon Fick today. She ordered pelvis MRI and bone scan, and I'll follow up with her in 2 weeks.    I'm also trying a few things in the meantime (aspercream, vitamin c, donut pillow). One of her recommendations was: "Talk to your PCP about starting you on celebrex."    How can I go about doing this? Thank you,  [name]

## 2016-07-24 NOTE — Telephone Encounter (Signed)
Routed to MD to Review/Advise.

## 2016-07-26 ENCOUNTER — Encounter (INDEPENDENT_AMBULATORY_CARE_PROVIDER_SITE_OTHER): Payer: Self-pay | Admitting: Internal Medicine

## 2016-07-26 NOTE — Telephone Encounter (Signed)
From: [name]  To: [name], MD  Sent: 07/25/2016 7:21 PM PDT  Subject: RE:Lab add on    Great, thank you!  ----- Message -----  From: [name], MD  Sent: 07/25/2016 4:22 PM PDT  To: Erik Apley. Renk  Subject: RE:Lab add on    [name] - happens all the time (it's actually been an issue with our lab, and the way they protocol stuff)    I re-ordered it    [name]    ----- Message -----   From: Erik Burke   Sent: 07/24/2016 9:41 PM PDT   To: [name], MD  Subject: RE:Lab add on    Hi Dr. Selinda Eon,    I walked over to [facility] lab yesterday immediately after the appointment with Dr. Salomon Fick, per request of Dr. Marygrace Drought nurse, to get Creatinine lab completed sometime around 5pm. This is in preparation for the upcoming scans.    I told every tech (3 people) to NOT process the lipid panel order, since I wasn't fasting...looks like they processed it anyway :/    I was planning on going into the lab on Wed, Thur, or Fri morning at 7:30am. Does that lipid panel need to be reordered?    Thanks!  [name]  ----- Message -----  From: [name], MD  Sent: 07/24/2016 8:49 AM PDT  To: Erik Apley. Caliendo  Subject: Lab add on    [name], I could not add on the lab because it was from yesterday  Were you fasting?? It says it was collected at 8 PM?    [name]

## 2016-07-27 ENCOUNTER — Ambulatory Visit
Admission: RE | Admit: 2016-07-27 | Discharge: 2016-07-27 | Disposition: A | Discharge status: Home / Self Care | Payer: Commercial Managed Care - HMO | Attending: Physician Assistant | Admitting: Physician Assistant

## 2016-07-27 ENCOUNTER — Ambulatory Visit (HOSPITAL_BASED_OUTPATIENT_CLINIC_OR_DEPARTMENT_OTHER)
Admit: 2016-07-27 | Discharge: 2016-07-27 | Disposition: A | Discharge status: Home Health | Payer: Commercial Managed Care - HMO

## 2016-07-27 DIAGNOSIS — M899 Disorder of bone, unspecified: Secondary | ICD-10-CM

## 2016-07-27 DIAGNOSIS — M533 Sacrococcygeal disorders, not elsewhere classified: Secondary | ICD-10-CM | POA: Insufficient documentation

## 2016-07-27 DIAGNOSIS — D492 Neoplasm of unspecified behavior of bone, soft tissue, and skin: Secondary | ICD-10-CM | POA: Insufficient documentation

## 2016-07-27 MED ORDER — TECHNETIUM TC 99M MEDRONATE IV KIT
26.7000 | PACK | Freq: Once | INTRAVENOUS | Status: AC
Start: 2016-07-27 — End: 2016-07-27
  Administered 2016-07-27: 26.7 via INTRAVENOUS
  Filled 2016-07-27: qty 27

## 2016-08-02 ENCOUNTER — Other Ambulatory Visit (INDEPENDENT_AMBULATORY_CARE_PROVIDER_SITE_OTHER): Payer: Commercial Managed Care - HMO | Attending: Internal Medicine

## 2016-08-02 ENCOUNTER — Encounter (INDEPENDENT_AMBULATORY_CARE_PROVIDER_SITE_OTHER): Payer: Self-pay

## 2016-08-02 ENCOUNTER — Encounter (INDEPENDENT_AMBULATORY_CARE_PROVIDER_SITE_OTHER): Payer: Self-pay | Admitting: Internal Medicine

## 2016-08-02 DIAGNOSIS — E785 Hyperlipidemia, unspecified: Principal | ICD-10-CM | POA: Insufficient documentation

## 2016-08-02 LAB — LIPID(CHOL FRACT) PANEL, BLOOD
Cholesterol: 269 mg/dL (ref <200)
HDL-Cholesterol: 45 mg/dL
LDL-Chol (Calc): 177 mg/dL — ABNORMAL HIGH (ref <160)
Non-HDL Cholesterol: 224 mg/dL
Triglycerides: 235 mg/dL — ABNORMAL HIGH (ref 10–170)

## 2016-08-02 NOTE — Telephone Encounter (Signed)
From: [name]  To: [name], MD  Sent: 08/02/2016 1:57 PM PDT  Subject: 1-Non Urgent Medical Advice    Hi Dr. Selinda Eon, In response to your inquiry about statins: from 2015-2017 I saw 7 general practitioners and specialist cardiologists (Drs. Olevia Bowens, [place], [place], [place], [place], [place], and [place]) in [place] and [place] for cholesterol and statin-related issues. I had trials of Lipitor, Pravastatin, Zetia, Crestor, and insurance-covered genetic testing for familial hypercholesterolemia. 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 Maybe time to see a [facility] cardiologist specialist asap? Thank you! [name]

## 2016-08-02 NOTE — Telephone Encounter (Signed)
Routed to MD to Advise/Review.

## 2016-08-03 ENCOUNTER — Inpatient Hospital Stay (INDEPENDENT_AMBULATORY_CARE_PROVIDER_SITE_OTHER)
Admit: 2016-08-03 | Discharge: 2016-08-03 | Disposition: A | Discharge status: Home Health | Payer: Commercial Managed Care - HMO

## 2016-08-03 ENCOUNTER — Other Ambulatory Visit (INDEPENDENT_AMBULATORY_CARE_PROVIDER_SITE_OTHER): Payer: Commercial Managed Care - HMO

## 2016-08-03 DIAGNOSIS — D492 Neoplasm of unspecified behavior of bone, soft tissue, and skin: Principal | ICD-10-CM

## 2016-08-03 NOTE — Telephone Encounter (Signed)
Patient read MyChart message.     Patient aware of the information given.   Closing Encounter.

## 2016-08-06 ENCOUNTER — Encounter (INDEPENDENT_AMBULATORY_CARE_PROVIDER_SITE_OTHER): Payer: Commercial Managed Care - HMO | Admitting: Internal Medicine

## 2016-08-06 ENCOUNTER — Ambulatory Visit
Admit: 2016-08-06 | Discharge: 2016-08-06 | Disposition: A | Discharge status: Home / Self Care | Payer: Commercial Managed Care - HMO | Attending: Orthopaedic Surgery | Admitting: Orthopaedic Surgery

## 2016-08-06 ENCOUNTER — Ambulatory Visit: Payer: Commercial Managed Care - HMO | Attending: Orthopaedic Surgery | Admitting: Orthopaedic Surgery

## 2016-08-06 ENCOUNTER — Telehealth (HOSPITAL_BASED_OUTPATIENT_CLINIC_OR_DEPARTMENT_OTHER): Payer: Self-pay | Admitting: Orthopaedic Surgery

## 2016-08-06 VITALS — BP 119/72 | HR 84 | Temp 97.5°F | Resp 16 | Ht 68.0 in | Wt 170.0 lb

## 2016-08-06 DIAGNOSIS — R2240 Localized swelling, mass and lump, unspecified lower limb: Principal | ICD-10-CM

## 2016-08-06 DIAGNOSIS — D492 Neoplasm of unspecified behavior of bone, soft tissue, and skin: Secondary | ICD-10-CM | POA: Insufficient documentation

## 2016-08-06 DIAGNOSIS — M533 Sacrococcygeal disorders, not elsewhere classified: Secondary | ICD-10-CM | POA: Insufficient documentation

## 2016-08-06 NOTE — Progress Notes (Signed)
[place] Record #: [id number]  DOB: Nov 23, 1980    Primary Care Physician:  [name]    Reason for visit: Follow up    Translator needed:  no    History of present Illness:    Erik Burke is a 36 year old male with a history of coccyx and sacral pain which radiates to the right thigh since Spring 2017.  Does not recall specific injury.  He states the pain can vary depending on position.  He especially has pain when sitting for long periods.  His pain will also worsen with sneezing or Valsalva.  He has had ESIs in the past as well as PT.    He has a h/o dural tears which is managed by a neurosurgeon, Dr Lorelle Gibbs, at [facility].  He also mentions he sustained an iliac crest fracture when he played soccer.  (See his self typed details of his pain and history scanned into media).  He has been seen by Dr Radene Knee as well as Dr Trinna Post.  He comes in today for MRI follow up.    Interval History:   He has been well. He denies SOB, chest pain, feeling of unwell.  He has not noticed new lumps or bumps.    Review of Systems:  Constitutional: No weight loss, fevers or chills.  HEENT: No headaches. No vision change. No hearing change. No epistaxis, throat clear.  Psychiatric: No mood change  Neurologic: No loss of consciousness  Cardiovascular: No chest pain, tachycardia, dyspnea on exertion  Respiratory: No shortness of breath, no cough.  GI: No jaundice, hematemesis, abdominal pain, diarrhea or constipation  GU: No dysuria or hematuria  MSK: As per HPI  Endocrine: No abnormalities  Heme/Lymphatic: No bleeding, anemia or lymphadenopathy  Allergy/Immunology: No new findings, no infections    Current Medications:    acetaZOLAMIDE (DIAMOX) 250 MG tablet Take 1 tablet (250 mg) by mouth daily as needed (headache).   clonazePAM (KLONOPIN WAFER) 0.25 MG disintegrating tablet Take 1 tablet (0.25 mg) by mouth at bedtime.   ClonazePAM 0.125 MG TBDP Take 1 tablet by  mouth every morning (before breakfast).   propranolol (INDERAL LA) 60 MG CP24 Take 1 capsule (60 mg) by mouth daily.   tretinoin (RETIN-A) 0.025 % cream Use a pea sized amount on the entire face nightly.  At first, you may use every other night to avoid dryness and irritation, and gradually increase use to nightly as tolerated       Patient Allergies:  No Known Allergies      Past Medical History:  Past Medical History:   Diagnosis Date    Acne     Back pain     secondary to herniated disc, from back injury in 2000    Hyperhidrosis     propanolol tx    Hyperlipidemia     LDL > 200, TG ~400 at Dx, statin intolerant.  Per pt, r/o for familial HDL    Parasomnia associated with sexual behavior     Tx with Caprice Renshaw, has been being downtitrated        Past Surgical History:  Past Surgical History:   Procedure Laterality Date    epidural steroid injection      x5 ESIs, most ecent 2017    l3/4 laminectomy  2014    @ [facility], 2014        Social History:  Social History  Social History    Marital status: Married     Spouse name: N/A    Number of children: N/A    Years of education: N/A     Occupational History    Not on file.     Social History Main Topics    Smoking status: Never Smoker    Smokeless tobacco: Never Used    Alcohol use Yes      Comment: 1-3 drinks infrequently    Drug use: Not on file    Sexual activity: Not on file     Other Topics Concern    Not on file     Social History Narrative    PhD in Psychology, works in [employer], [employer]        Family History:  Family History   Problem Relation Age of Onset    Cancer Mother      cervical    No Known Problems Father     Cancer Maternal Grandmother      ovarian    No Known Cancer Maternal Grandfather     Heart Disease Maternal Grandfather     Cancer Paternal Grandmother      pancreatic    Cancer Paternal Grandfather      mesothelioma    Cancer Brother 17     osteosarcoma       Physical Exam:  BP 119/72 (BP Location: Left arm, BP Patient  Position: Sitting, BP cuff size: Regular)   Pulse 84   Temp 97.5 F (36.4 C) (Oral)   Resp 16   Ht 5\' 8"  (1.727 m)   Wt 77.1 kg (170 lb)   BMI 25.85 kg/m2     General Appearance:   Healthy, alert, no distress, pleasant cooperative.  Psychiatric:  Mood and affect appropriate.  HEENT: Cranial nerves intact, oropharynx clear  Neck: Supple.  Lungs: No audible wheezes.  Extremities: No cyanosis, clubbing or edema.  Skin: Skin color, texture and turgor are normal. No rashes or lesions.    Neuro: No gross sensory and motor abnormalities.  Vascular: Strong distal pulses.  Lymph: No lymphadenopathy  MSK:  He ambulates with a normal gait.  +TTP at lower sacrum / coccyx.  No palpable mass.  No pelvis pain with lateral compression of pelvis.  Pt can reproduce coccyx pain with Valsalva maneuver.  Right thigh appears slightly larger than left.    Laboratory and Radiology:  I viewed the radiographic studies today.    MRI pelvis from 08/03/16 shows:  There is no finding to correlate with the clinically described "right sacroiliac joint bone spur/bone mass."    However, there is a small ossific bone spur projecting from the posterior aspect of the superolateral right iliac wing which is seen to better advantage on the 05/24/2016 CT. There is no abnormal marrow signal in this region or elsewhere within the imaged pelvis. Aside from this finding, there is no abnormal bone morphology. There is no evidence of malalignment, stress reaction, avascular necrosis, or specific features of cam or pincer type femoroacetabular impingement.    There is a physiologic amount of hip joint fluid bilaterally. The bilateral hip labra, ligamentum teres, transverse ligaments, and hyaline cartilage are suboptimally evaluated on these large field-of-view images, however there is no discrete chondrolabral separation, large paralabral cyst, or definite full-thickness femoroacetabular chondral loss. There is no abnormal periarticular bursal fluid  collection.    The muscles are relatively normal in bulk and signal intensity. There is no significant tendon degeneration  or tendon tearing.    There is metallic susceptibility artifact in the left lower abdomen and left inguinal region related to metallic coils, limiting evaluation in these regions. Otherwise, limited imaging of the abdominopelvic visceral and vascular structures is relatively unremarkable. There are tiny bilateral hydroceles which are nonspecific. There is no pelvic lymphadenopathy.    Assesment:   CAN LUCCI has:    ICD-10-CM ICD-9-CM   1. Coccyx pain M53.3 724.79   2. 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    Discussed no concerning tumor.  There is some fluid noted around the coccyx.  He can f/u with Dr Radene Knee to see if this is an area that may be amenable for injection.    Follow-up:  prn    The patient was seen and evaluated by Dr. Salomon Fick.  The plan was formulated by Dr. Salomon Fick and presented to the patient by her as well.

## 2016-08-06 NOTE — Patient Instructions (Addendum)
Follow up with Dr Radene Knee or Dr Trinna Post for your coccyx pain.      [facility] Team Phone List  Hours of Operation:  Monday-Friday 8:00am - 5:00pm  (Closed Holidays and Weekends)  AFTER HOURS/WEEKEND EMERGENCY CONTACT NUMBER    [phone]  *Ask for the Orthopedic Physician On-Call  *Ask Resident to call Dr. Salomon Fick for advice if you are not satisfied with response    Administrative Assistant  April Tuiletufuga                                                                 Ph : [phone]          Fax: [phone]  Providers:  Roosvelt Maser, MD, Orthopedic Oncologist and Orthopedic Surgeon  Caprice Renshaw, PA-C, Physician Assistant    Sofie Hartigan, RN, Nurse Case Manager  Ph: [phone]       Margaretmary Eddy, [place], Social Worker for Oncology  Ph: [phone]            Additional Numbers:  Billing Office       [phone]  [email or web address]    Development worker, community for [employer]   [place]     [phone]    Medical Records     [phone]    PET Scan Scheduling     [phone]    Radiology      [phone]    Radiation Oncology      [phone]    Registration (to update personal information) [phone]    [facility]  [address] [place] [address]

## 2016-08-07 ENCOUNTER — Encounter (HOSPITAL_BASED_OUTPATIENT_CLINIC_OR_DEPARTMENT_OTHER): Payer: Self-pay

## 2016-08-07 ENCOUNTER — Encounter (INDEPENDENT_AMBULATORY_CARE_PROVIDER_SITE_OTHER): Payer: Commercial Managed Care - HMO | Admitting: Pain Medicine

## 2016-08-07 ENCOUNTER — Ambulatory Visit: Payer: Commercial Managed Care - HMO | Attending: Internal Medicine | Admitting: Orthopedics

## 2016-08-07 VITALS — BP 144/83 | HR 80 | Temp 96.6°F

## 2016-08-07 DIAGNOSIS — G8929 Other chronic pain: Secondary | ICD-10-CM | POA: Insufficient documentation

## 2016-08-07 DIAGNOSIS — R52 Pain, unspecified: Secondary | ICD-10-CM | POA: Insufficient documentation

## 2016-08-07 DIAGNOSIS — M545 Low back pain: Secondary | ICD-10-CM | POA: Insufficient documentation

## 2016-08-07 DIAGNOSIS — M5136 Other intervertebral disc degeneration, lumbar region: Secondary | ICD-10-CM

## 2016-08-07 DIAGNOSIS — M961 Postlaminectomy syndrome, not elsewhere classified: Secondary | ICD-10-CM | POA: Insufficient documentation

## 2016-08-07 DIAGNOSIS — M48061 Spinal stenosis, lumbar region without neurogenic claudication: Secondary | ICD-10-CM

## 2016-08-07 DIAGNOSIS — M533 Sacrococcygeal disorders, not elsewhere classified: Principal | ICD-10-CM | POA: Insufficient documentation

## 2016-08-08 ENCOUNTER — Encounter (HOSPITAL_BASED_OUTPATIENT_CLINIC_OR_DEPARTMENT_OTHER): Payer: Self-pay | Admitting: Orthopedics

## 2016-08-08 NOTE — Telephone Encounter (Signed)
Routed to Onset

## 2016-08-08 NOTE — Telephone Encounter (Signed)
From: [name]  To: Garrison Columbus, MD  Sent: 08/08/2016 3:02 PM PDT  Subject: 2-Procedural Question    Hello Dr. Tally Due,    As I mentioned yesterday, here is the contact information of the neuroradiologist at [facility] who treated the dural tears. She also completed numerous epidurals to in my back and was in consultation with the orthopedic surgeon for laminectomy/discectomy. She has substantial knowledge of my back and I would love if you could briefly chat with her so that everyone is on the same page about treatment, for continuity of care purposes.    I gave her the heads up that you would be calling, and she said to call her cell phone directly. This is her preference for physician-to-physician communication.     Iva Lento, MD  [place], [place])  Cell: [phone]    She is expecting a call this week. Please let me know if you have any other questions.    Thank you very much,  [name]  [phone]

## 2016-08-08 NOTE — Progress Notes (Signed)
Attending Note:    I have personally performed a face-to-face encounter with the patient and concurrently with Crissie Reese, NP.  See her note for 2-25 for more details    Subjective:  My history findings are as follows: Sacro-coccygeal area pain, and perineal burning. He has a complex spine history detailed in E. Stimson's note      Objective:   My physical examination findings are as follows:No specific neuro findings.  Some pain over lower sacrum. MRI is limited due to pelvis coils from a previous surgery  I have reviewed and agree with the assessment and plan as documented by the nurse practitioner,E.Stimson, with the following additions: will get CT as it may help evaluate lower spinal segments.

## 2016-08-09 ENCOUNTER — Other Ambulatory Visit: Payer: Self-pay | Admitting: Diagnostic Radiology

## 2016-08-10 ENCOUNTER — Encounter (HOSPITAL_BASED_OUTPATIENT_CLINIC_OR_DEPARTMENT_OTHER): Payer: Self-pay | Admitting: Orthopedics

## 2016-08-10 NOTE — Progress Notes (Signed)
ORTHOPAEDIC SPINE SURGERY     Visit Type: New Visit    Requesting Provider: [name]    Reason for Visit: Back Pain    History Of Present Illness: 36 year old male complains of lower sacral and coccyx pain with associated burning in his perineum and perianal region. He characterizes the coccyx and lower sacral pain as an ache and stabbing sensation. 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  He comes today with a long history of back related problems.  He states that in 2002 while in high school he suffered a soccer injury after twisting.  He states his leg gave way he was able to move his legs however he was hospitalized.  He was told that he had a right iliac crest fracture.  Ultimately he did get somewhat better however continued to have some right anterior leg pain. In 2012 he had increase in his right leg pain and an MRI was done.  In 2012 or 2013 he underwent a right L3-4 diskectomy and did well with good relief of his leg pain. Also during this time he was suffering headaches and pain in his neck.  He states that he had severe pain in his forehead.  A brain MRI scan revealed decrease in the cerebral spinal fluid level. 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  Under the care of neurosurgeon at [facility], underwent a blood patch however this was not effective and then he had a fibrin blood mixture patch and this resolved his pain. He would like [place] to get in touch with the neurosurgeon and will supply her contact information.  He has had back pain and occasional right leg pain. For this he has received for epidural steroid injections since his surgery. The epidural steroid injection has helped the leg pain. However in 2017 he experienced severe coccyx pain radiating from his sacral region.  With this he has associated peri anal and perineum numbness.  The numbness and burning is aggravated with sitting.  He has not had any specific  treatment for this pain which she is here for today.  Patient rates his pain as 2. Pain is intermittent and severe described as burning, numbness and stabbing. Patient has no weakness of upper or lower extremities and no difficulty walking. The pain started with no precipitating event. Pain is made worse by sitting. Pain is reduced by changing positions. Patient has tried nothing. Patient reports no changes in bowel and bladder function.    Allergies: No Known Allergies    Medications:   Current Outpatient Prescriptions   Medication Sig    acetaZOLAMIDE (DIAMOX) 250 MG tablet Take 1 tablet (250 mg) by mouth daily as needed (headache).    clonazePAM (KLONOPIN WAFER) 0.25 MG disintegrating tablet Take 1 tablet (0.25 mg) by mouth at bedtime.    ClonazePAM 0.125 MG TBDP Take 1 tablet by mouth every morning (before breakfast).    propranolol (INDERAL LA) 60 MG CP24 Take 1 capsule (60 mg) by mouth daily.    tretinoin (RETIN-A) 0.025 % cream Use a pea sized amount on the entire face nightly.  At first, you may use every other night to avoid dryness and irritation, and gradually increase use to nightly as tolerated     No current facility-administered medications for this visit.  Past Medical History:  has a past medical history of Acne; Back pain; Hyperhidrosis; Hyperlipidemia; and Parasomnia associated with sexual behavior.    Past Surgical History:  has a past surgical history that includes epidural steroid injection and l3/4 laminectomy (2014).    Social History:  reports that he has never smoked. He has never used smokeless tobacco. He reports that he drinks alcohol.    Family History: family history includes Cancer in his maternal grandmother, mother, paternal grandfather, and paternal grandmother; Cancer (age of onset: 26) in his brother; Heart Disease in his maternal grandfather; No Known Cancer in his maternal grandfather; No Known Problems in his father.    Review of  Systems:  CONSTITUTIONAL: No unexpected weight loss, fevers, chills, or anorexia. SKIN: Noncontributory. EARS, NOSE AND THROAT: Noncontributory. EYES: Noncontributory. LUNGS: Noncontributory. CARDIAC: Noncontributory. GASTROINTESTINAL: Noncontributory. GENITOURINARY: Symptoms as noted above in HPI. NEUROLOGIC: Symptoms as noted above in HPI. MUSCULOSKELETAL: Symptoms as noted above in HPI.    Physical Examination:  BP 144/83 (BP Location: Right arm, BP Patient Position: Sitting, BP cuff size: Regular)   Pulse 80   Temp 96.6 F (35.9 C) (Oral)  CONSTITUTIONAL: Well appearing and well groomed. PSYCHOLOGICAL: Alert and oriented and appropriate to situation. INTEGUMENT: Intact. VASCULAR: Radial and pedal pulses are intact and symmetric. EXTREMITIES: Bilateral upper and lower extremities have good range of motion and no significant deformities. GAIT: Brisk with good coordination.     LUMBAR SPINE  Nontender to percussion. Heel to toe walk without deficit. Sensation intact of the lower extremities. Trendelenburg sign negative. Skin intact.  No specific tenderness to palpation at the coccyx.    RANGE OF MOTION        RT  LT  lateral bend, normal at 25 degrees 25  25  rotation, normal at 30 degrees 30  30  Flexion - normal at 40 degrees. Extension - normal at 10 degrees.    MOTOR STRENGTH       RT  LT  Iliopsoas   5/5  5/5  Quadricep   5/5  5/5  Anterior tibialis   5/5  5/5  Extensor hallucis longus 5/5  5/5  Gastrocsoleus  5/5  5/5    DEEP TENDON REFLEXES       RT  LT  Patellar   2+  2+  Achilles   2+  2+    PATHOLOGIC REFLEXES      RT LT  Clonus    Neg Neg  Babinski   Neg Neg  Hoffmans   Neg Neg    STRAIGHT LEG RAISING       RT  LT  Sitting @ 90 degrees   Neg Neg    IMAGING STUDIES: Pelvic MRI, 04/26/16:  FINDINGS:  Limited localized imaging of the pelvis demonstrates exuberant metallic susceptibility artifact in the left lower quadrant of the abdomen, presumably related to radiopaque coils seen in this region on the  03/05/2016 radiographs.        Please note that evaluation of the bones, joints, soft tissues, and abdominopelvic visceral structures cannot be made from the nondiagnostic images which were acquired solely for localization purposes.         Impression   IMPRESSION:  Terminated MR sacral plexus examination due to patient stating that he had extreme pain/discomfort in the left lower abdomen after the localization process and in the initial phase is of diagnostic image acquisition. This examination was terminated by the on-site radiologist given the patient's symptoms raised concern for  MR incompatibility of the coil material seen in this region on the prior radiographs. However, this material was present on prior imaging dating back to 09/28/2011 and the patient has had at least 2 MR studies in the interval since (08/18/2012 and 04/28/2015). A repeat attempt at imaging could be considered at the discretion of the referring provider and if the patient is willing. However, prior to any repeat attempts at MR imaging, medical records regarding the nature of the patient's coiling procedure and the material used could be helpful in determining MR and sequence-specific compatibility.       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  Status post decompression at L3-4 on the right.    IMPRESSION:   Encounter Diagnoses   Name Primary?    Coccydynia Yes    Chronic low back pain without sciatica, unspecified back pain laterality      PLAN/DISCUSSION:  Have ordered a CT scan of the sacrum and coccyx to better evaluate the anatomy.  On the pelvic MRI scan that the lowest segment of his coccyx is angled anteriorly however there is no evidence of bone edema and no cysts and no fracture noted. The plan is for referral to Dr. Trinna Post, pain management, for consultation consult for coccyx injection.  If he gets relief with toxic  injection, he may be a candidate for a coccygectomy.  We will be in touch with his neurosurgeon regarding the idiopathic dural tears once he since the information.  We spent 32minutes with this patient. More than 50 percent of this time was spent discussing the diagnosis and the treatment plan. All questions were answered and URA HAUSEN understood and was satisfied with this plan.     FOLLOWUP: Return to clinic six weeks. The patient is encouraged to call us with any questions or problems in the interim. Our contact numbers were given to the patient.

## 2016-08-15 ENCOUNTER — Ambulatory Visit (HOSPITAL_BASED_OUTPATIENT_CLINIC_OR_DEPARTMENT_OTHER): Admit: 2016-08-15 | Payer: Commercial Managed Care - HMO

## 2016-08-15 ENCOUNTER — Encounter (INDEPENDENT_AMBULATORY_CARE_PROVIDER_SITE_OTHER): Payer: Self-pay | Admitting: Internal Medicine

## 2016-08-15 ENCOUNTER — Encounter (HOSPITAL_BASED_OUTPATIENT_CLINIC_OR_DEPARTMENT_OTHER): Payer: Self-pay | Admitting: Orthopedics

## 2016-08-15 DIAGNOSIS — M2142 Flat foot [pes planus] (acquired), left foot: Principal | ICD-10-CM

## 2016-08-15 DIAGNOSIS — M2141 Flat foot [pes planus] (acquired), right foot: Principal | ICD-10-CM

## 2016-08-15 NOTE — Telephone Encounter (Signed)
From: [name]  To: Garrison Columbus, MD  Sent: 08/15/2016 3:58 PM PDT  Subject: 2-Procedural Question    Following up about this call.    Thank you,  Erik Burke  ----- Message -----  From: Erik Foerster, Erik Burke  Sent: 08/10/2016 1:19 PM PDT  To: Erik Apley. Burke  Subject: RE: 2-Procedural Question    Dear Erik Burke,  Dr. Tally Due was not able to call this week. He will be in clinic on Tuesday and will have him call then. Take care.  Erik Carls, Erik Burke    ----- Message -----   From: Erik Burke   Sent: 08/08/2016 3:02 PM PDT   To: Garrison Columbus, MD  Subject: 2-Procedural Question    Hello Dr. Tally Due,    As I mentioned yesterday, here is the contact information of the neuroradiologist at [facility] who treated the dural tears. She also completed numerous epidurals to in my back and was in consultation with the orthopedic surgeon for laminectomy/discectomy. She has substantial knowledge of my back and I would love if you could briefly chat with her so that everyone is on the same page about treatment, for continuity of care purposes.    I gave her the heads up that you would be calling, and she said to call her cell phone directly. This is her preference for physician-to-physician communication.     Erik Lento, MD  [place], [place])  Cell: [phone]    She is expecting a call this week. Please let me know if you have any other questions.    Thank you very much,  Erik Burke  [phone]

## 2016-08-15 NOTE — Telephone Encounter (Signed)
Routed to Liz

## 2016-08-15 NOTE — Telephone Encounter (Signed)
Routed to MD to Advise/Review.   Thank you!

## 2016-08-15 NOTE — Telephone Encounter (Signed)
From: [name]  To: [name], MD  Sent: 08/15/2016 4:05 PM PDT  Subject: 2-Procedural Question    Hi Dr. Selinda Eon,    I forgot to mention during my last appointment with you that I am due for new orthotics. I usually get new inserts every couple years for flat feet (which in turn seems to help a little with my back), and it's been over a couple years. I've been doing this for a decade or more, and I'm all-ears if you have another perspective on this, and also who you might recommend to get fitted.    Much thanks,  Erik Burke

## 2016-08-16 NOTE — Progress Notes (Signed)
Dear Dr. Selinda Eon, Lanetta Inch    I saw and evaluated the patient.  My interview and exam confirm the key elements reported and I agree with the plan as written. Please see the attached note for details.    Lab No orders of the defined types were placed in this encounter.    Imaging No orders of the defined types were placed in this encounter.    Procedures No orders of the defined types were placed in this encounter.    Other No orders of the defined types were placed in this encounter.      Plan:    ICD-10-CM ICD-9-CM    1. Coccyx pain M53.3 724.79    2. 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  I can be reached directly by email or my phone.    Best regards,    Harvest Forest, MD, MSc, FRCSC  [email or web address]  [phone]

## 2016-08-17 NOTE — Telephone Encounter (Signed)
Routed to MD to Advise.

## 2016-08-20 ENCOUNTER — Encounter (HOSPITAL_BASED_OUTPATIENT_CLINIC_OR_DEPARTMENT_OTHER): Payer: Self-pay | Admitting: Orthopedics

## 2016-08-22 ENCOUNTER — Encounter (HOSPITAL_BASED_OUTPATIENT_CLINIC_OR_DEPARTMENT_OTHER): Payer: Self-pay | Admitting: Orthopaedic Surgery

## 2016-08-22 NOTE — Telephone Encounter (Signed)
From: [name]  To: Garrison Columbus, MD  Sent: 08/20/2016 7:41 PM PDT  Subject: 2-Procedural Question    Hello,    Following up about this again.    Thank you,  [name]  ----- Message -----  From: Timmie Foerster, NP  Sent: 08/10/2016 1:19 PM PDT  To: Erik Apley. Valente  Subject: RE: 2-Procedural Question    Dear Erik Burke,  Dr. Tally Due was not able to call this week. He will be in clinic on Tuesday and will have him call then. Take care.  Macario Carls, NP    ----- Message -----   From: Erik Burke   Sent: 08/08/2016 3:02 PM PDT   To: Garrison Columbus, MD  Subject: 2-Procedural Question    Hello Dr. Tally Due,    As I mentioned yesterday, here is the contact information of the neuroradiologist at [facility] who treated the dural tears. She also completed numerous epidurals to in my back and was in consultation with the orthopedic surgeon for laminectomy/discectomy. She has substantial knowledge of my back and I would love if you could briefly chat with her so that everyone is on the same page about treatment, for continuity of care purposes.    I gave her the heads up that you would be calling, and she said to call her cell phone directly. This is her preference for physician-to-physician communication.     Iva Lento, MD  [place], [place])  Cell: [phone]    She is expecting a call this week. Please let me know if you have any other questions.    Thank you very much,  Erik Burke  [phone]

## 2016-08-24 ENCOUNTER — Telehealth (INDEPENDENT_AMBULATORY_CARE_PROVIDER_SITE_OTHER): Payer: Self-pay | Admitting: Internal Medicine

## 2016-08-24 DIAGNOSIS — Z111 Encounter for screening for respiratory tuberculosis: Principal | ICD-10-CM

## 2016-08-24 NOTE — Telephone Encounter (Signed)
Routed to MD to Sign Pended Order if agreeable.   Thank you.

## 2016-08-24 NOTE — Telephone Encounter (Signed)
Lab Order Request    Name of PCP Provider: [name]   Insurance Coverage Verified: Active  Patient is calling is calling to request lab test orders for: TB blood test   Reason for request: tb test required by Pt's employment  Last office visit: 07/20/2016  Next office visit: 10/19/2016  Appointment date and time desired? Monday AM , Monday PM, Tuesday AM, Tuesday PM, Wednesday AM, Wednesday PM, Thursday AM, Thursday PM, Friday AM  and Friday PM   Patient would like to be contacted via [phone]   Alternative communication method: [phone] home     Has been advised request will be transmitted to [place]; and should expect a response within the next 24-72 hrs.

## 2016-08-27 ENCOUNTER — Encounter (INDEPENDENT_AMBULATORY_CARE_PROVIDER_SITE_OTHER): Payer: Self-pay | Admitting: Internal Medicine

## 2016-08-27 ENCOUNTER — Ambulatory Visit: Payer: Commercial Managed Care - HMO | Attending: Physician Assistant | Admitting: Physician Assistant

## 2016-08-27 ENCOUNTER — Other Ambulatory Visit (INDEPENDENT_AMBULATORY_CARE_PROVIDER_SITE_OTHER): Payer: Self-pay | Admitting: Internal Medicine

## 2016-08-27 DIAGNOSIS — M533 Sacrococcygeal disorders, not elsewhere classified: Principal | ICD-10-CM | POA: Insufficient documentation

## 2016-08-27 DIAGNOSIS — G4750 Parasomnia, unspecified: Principal | ICD-10-CM

## 2016-08-27 MED ORDER — CLONAZEPAM ODT 0.125 MG OR TBDP
1.0000 | ORAL_TABLET | Freq: Every day | ORAL | 0 refills | Status: DC
Start: 2016-08-27 — End: 2016-09-26

## 2016-08-27 NOTE — Progress Notes (Unsigned)
PAIN CLINIC FOLLOW UP NOTE    Primary Care Physician [name]    SUBJECTIVE:  This is a 36 year old male with chronic with coccyx and sacrum pain since spring 2017.  Pain is intermittently associated with perineum/anal numbness. Pain is exacerbated with sitting, activity, and changing positions. He use to have a radicular component to this pain but resolved since attending PT. Recently followed up with Dr. Tally Due and Dr. Salomon Fick with recommendation to undergo a coccyx injection. Recent MRI revealed that the lowest segment of the coccyx is angled anteriorly. No other significant abnormalities. No significant changes in the location or quality of his pain. There are no red flag symptoms    Has history of idiopathic dural tears(x7) managed by Dr. Dorene Sorrow at [facility].        Pain Treatments  Previous pain procedures and response include:   -ESI in the past  Patient is currently using the following pain medications:  -none    Patient's significant social history is:   Social History     Social History    Marital status: Married     Spouse name: N/A    Number of children: N/A    Years of education: N/A     Occupational History    Not on file.     Social History Main Topics    Smoking status: Never Smoker    Smokeless tobacco: Never Used    Alcohol use Yes      Comment: 1-3 drinks infrequently    Drug use: Not on file    Sexual activity: Not on file     Other Topics Concern    Not on file     Social History Narrative    PhD in Psychology, works in [employer], [place] [employer]       Review of Systems   General: Negative  Gastrointestinal: Negative  Psychiatry: Negative  Respiratory: Negative  Musculoskeletal: Joint pain and Muscle pain  Neurological: Negative      Past medical history is significant for:   has a past medical history of Acne; Back pain; Hyperhidrosis; Hyperlipidemia; and Parasomnia associated with sexual behavior.    Current medications include:  Current Outpatient Prescriptions      Medication Sig Dispense Refill    acetaZOLAMIDE (DIAMOX) 250 MG tablet Take 1 tablet (250 mg) by mouth daily as needed (headache).      clonazePAM (KLONOPIN WAFER) 0.25 MG disintegrating tablet Take 1 tablet (0.25 mg) by mouth at bedtime. 30 tablet 0    ClonazePAM 0.125 MG TBDP Take 1 tablet by mouth every morning (before breakfast). 30 tablet 0    propranolol (INDERAL LA) 60 MG CP24 Take 1 capsule (60 mg) by mouth daily. 90 each 1    tretinoin (RETIN-A) 0.025 % cream Use a pea sized amount on the entire face nightly.  At first, you may use every other night to avoid dryness and irritation, and gradually increase use to nightly as tolerated 45 g 3     No current facility-administered medications for this visit.        Patient's current allergies are:  Review of patient's allergies indicates no known allergies.    Physical Exam  Vitals: There were no vitals taken for this visit.  General: healthy, alert, cooperative  Mental Status:  alert, oriented x 3. Speech is clear/ normal  Affect: euthymic  Skin:  No rashes or bruises.  HEENT:  Pupils equal, not pinpoint.  Pulmonary:  Breathing easily  without tachypnea or bradypnea.  Cardiac:  No LE edema.  Abdomen:  Not distended.  Ambulation: Pt is able to raise from a seated position without difficulty. Gait is not antalgic and the patient ambulates without assistance.   Musculoskeletal:    Back: No changes from previous visits  Neurosensory: Motor exam: 5/5 BLE. Sensory exam: normal sensation to light touch.    EXAM DESCRIPTION:  MRI PELVIS BONE W/O CONTRAST done on 05/18/16    CLINICAL HISTORY:  Right buttock/hamstring symptoms, please evaluate the sacral plexus    TECHNIQUE:  MRI of the pelvis/sacral plexus was to be attempted utilizing T1 and fluid sensitive imaging sequences in axial, sagittal, and coronal imaging planes. No intravenous or intra-articular contrast was to be employed.    COMPARISON:  03/05/2016, 08/18/2012, 09/28/2011    FINDINGS:  Limited localized  imaging of the pelvis demonstrates exuberant metallic susceptibility artifact in the left lower quadrant of the abdomen, presumably related to radiopaque coils seen in this region on the 03/05/2016 radiographs.        Please note that evaluation of the bones, joints, soft tissues, and abdominopelvic visceral structures cannot be made from the nondiagnostic images which were acquired solely for localization purposes.         Impression   IMPRESSION:  Terminated MR sacral plexus examination due to patient stating that he had extreme pain/discomfort in the left lower abdomen after the localization process and in the initial phase is of diagnostic image acquisition. This examination was terminated by the on-site radiologist given the patient's symptoms raised concern for MR incompatibility of the coil material seen in this region on the prior radiographs. However, this material was present on prior imaging dating back to 09/28/2011 and the patient has had at least 2 MR studies in the interval since (08/18/2012 and 04/28/2015). A repeat attempt at imaging could be considered at the discretion of the referring provider and if the patient is willing. However, prior to any repeat attempts at MR imaging, medical records regarding the nature of the patient's coiling procedure and the material used could be helpful in determining MR and sequence-specific compatibility.           ASSESSMENT  Encounter Diagnoses   Name Primary?    Coccydynia Yes       This is a 36 year old male with chronic sacrum/coccyx pain with unknown etiology following up to request a coccyx injection as recommended by Dr. Tally Due in [place]. Depending on the results there may be consideration for a coccygectomy. The r/b/a are discussed and patient is eager to proceed.     PLAN    Medication Plan:   No changes    Procedure Plan:  Ganglion Impar injection to include sacrococcygeal ligament with fluoro guidance for the treatment of  coccydynia    Follow-up: after procedure as recommended by Dr. Trinna Post    Dr. Juleen China was present in the clinic as the supervising physician and was immediately available.

## 2016-08-27 NOTE — Telephone Encounter (Signed)
Medication requested:   Requested Prescriptions     Pending Prescriptions Disp Refills    ClonazePAM 0.125 MG TBDP 30 tablet 0     Sig: Take 1 tablet by mouth every morning (before breakfast).       Date of last refill: 04.06.18    Last OV (provider):      07/20/2016  Last OV (department): 07/20/2016    Next OV (provider):      10/19/2016  Next OV (department): 10/19/2016

## 2016-08-27 NOTE — Telephone Encounter (Signed)
I ran the patient's name and date-of-birth through the [facility] Department of [place] website which will list controlled drug prescriptions filled at [facility] in the past 12 months. The information in the patient's CURES report is consistent with their self-report and shows no evidence of controlled substance abuse or doctor shopping.    See note entry for documentation of cures check.

## 2016-08-27 NOTE — Telephone Encounter (Signed)
Routed to Np

## 2016-08-27 NOTE — Progress Notes (Signed)
I ran the patient's name and date-of-birth through the [place] [employer] Prescription Drug Monitoring Program CURES website which will list controlled drug prescriptions filled at [place] pharmacies in the past 12 months. The information in the patient's CURES report is consistent with their self-report and shows no evidence of controlled substance abuse or doctor shopping.

## 2016-08-28 ENCOUNTER — Ambulatory Visit
Admission: RE | Admit: 2016-08-28 | Discharge: 2016-08-28 | Disposition: A | Discharge status: Home / Self Care | Payer: Commercial Managed Care - HMO | Attending: Diagnostic Radiology | Admitting: Diagnostic Radiology

## 2016-08-28 DIAGNOSIS — M533 Sacrococcygeal disorders, not elsewhere classified: Secondary | ICD-10-CM

## 2016-08-28 DIAGNOSIS — M438X8 Other specified deforming dorsopathies, sacral and sacrococcygeal region: Principal | ICD-10-CM | POA: Insufficient documentation

## 2016-09-04 NOTE — Telephone Encounter (Signed)
Patient is calling to follow up on TB test request. Noted future order in system. Advised pt that he can go to [facility] lab to complete. Pt verbalized understanding.    Please note, or advise only if necessary.

## 2016-09-04 NOTE — Telephone Encounter (Signed)
Noted. Thank You.

## 2016-09-05 ENCOUNTER — Other Ambulatory Visit (INDEPENDENT_AMBULATORY_CARE_PROVIDER_SITE_OTHER): Payer: Commercial Managed Care - HMO | Attending: Internal Medicine

## 2016-09-05 DIAGNOSIS — Z111 Encounter for screening for respiratory tuberculosis: Principal | ICD-10-CM

## 2016-09-05 NOTE — Interdisciplinary (Signed)
Blood drawn from left arm with 23 gauge needle. 3 tubes taken.   Patient identity authenticated by Laurence J Orgaya.

## 2016-09-09 ENCOUNTER — Encounter (INDEPENDENT_AMBULATORY_CARE_PROVIDER_SITE_OTHER): Payer: Self-pay | Admitting: Internal Medicine

## 2016-09-09 LAB — QUANTIFERON-TB, BLOOD
Quantiferon TB: NEGATIVE
TB Antigen - Nil: 0.007 [IU]/mL

## 2016-09-14 ENCOUNTER — Encounter (INDEPENDENT_AMBULATORY_CARE_PROVIDER_SITE_OTHER): Payer: Self-pay | Admitting: Internal Medicine

## 2016-09-14 DIAGNOSIS — G4750 Parasomnia, unspecified: Principal | ICD-10-CM

## 2016-09-17 ENCOUNTER — Encounter (INDEPENDENT_AMBULATORY_CARE_PROVIDER_SITE_OTHER): Payer: Self-pay | Admitting: Internal Medicine

## 2016-09-17 NOTE — Telephone Encounter (Signed)
Routed to MD as FYI.

## 2016-09-17 NOTE — Telephone Encounter (Signed)
From: [name]  To: [name], MD  Sent: 09/14/2016 6:52 PM PDT  Subject: 20-Other    Hi Dr. Selinda Eon, good news. I've completely tapered off clonazepam at this time, with no parasomnia incidences in the recent past. I'll be casually monitoring this, but wanted to let you know. Feel free to update the medication list if you deem appropriate. (I'm actually applying for life insurance and they'll likely be doing a records review soon, if they haven't already, so this is good timing) Thanks! -[name]

## 2016-09-18 NOTE — Telephone Encounter (Signed)
From: [name]  To: [name], MD  Sent: 09/17/2016 9:00 PM PDT  Subject: [name]    Awesome! Yep doing well. Baby transitioned from newborn to infant phase so feeling like a successful parent.     Thanks for competing that TB paperwork. I have a few job offers, trying to determine the best career trajectory.     Thanks again!  [name]  ----- Message -----  From: [name], MD  Sent: 09/17/2016 10:09 AM PDT  To: Erik Apley. Rash  Subject: RE: 20-Other    Excellent to hear!  Med removed.  Problem list updated with a notation.     Thanks for letting me know and congrats  Any changes let me know    I hope you're otherwise well    Thurmond Butts    ----- Message -----   From: Erik Burke   Sent: 09/14/2016 6:52 PM PDT   To: [name], MD  Subject: 20-Other    Hi Dr. Selinda Eon, good news. I've completely tapered off clonazepam at this time, with no parasomnia incidences in the recent past. I'll be casually monitoring this, but wanted to let you know. Feel free to update the medication list if you deem appropriate. (I'm actually applying for life insurance and they'll likely be doing a records review soon, if they haven't already, so this is good timing) Thanks! -[name]

## 2016-09-19 ENCOUNTER — Encounter (INDEPENDENT_AMBULATORY_CARE_PROVIDER_SITE_OTHER): Payer: Self-pay | Admitting: Internal Medicine

## 2016-09-19 NOTE — Progress Notes (Signed)
TB paperwork filled out  No identified risk factors - regardless neg QFT per recent records

## 2016-09-20 ENCOUNTER — Encounter (INDEPENDENT_AMBULATORY_CARE_PROVIDER_SITE_OTHER): Payer: Self-pay

## 2016-09-26 ENCOUNTER — Ambulatory Visit (INDEPENDENT_AMBULATORY_CARE_PROVIDER_SITE_OTHER): Payer: Commercial Managed Care - HMO | Admitting: Cardiovascular Disease

## 2016-09-26 ENCOUNTER — Encounter (INDEPENDENT_AMBULATORY_CARE_PROVIDER_SITE_OTHER): Payer: Self-pay | Admitting: Cardiovascular Disease

## 2016-09-26 ENCOUNTER — Telehealth (INDEPENDENT_AMBULATORY_CARE_PROVIDER_SITE_OTHER): Payer: Self-pay | Admitting: Internal Medicine

## 2016-09-26 VITALS — BP 130/80 | HR 85 | Temp 97.8°F | Resp 16 | Ht 68.0 in | Wt 169.0 lb

## 2016-09-26 DIAGNOSIS — E785 Hyperlipidemia, unspecified: Secondary | ICD-10-CM

## 2016-09-26 DIAGNOSIS — E7800 Pure hypercholesterolemia, unspecified: Principal | ICD-10-CM

## 2016-09-26 DIAGNOSIS — E782 Mixed hyperlipidemia: Secondary | ICD-10-CM

## 2016-09-26 DIAGNOSIS — R61 Generalized hyperhidrosis: Secondary | ICD-10-CM

## 2016-09-26 DIAGNOSIS — Z789 Other specified health status: Secondary | ICD-10-CM

## 2016-09-26 NOTE — Telephone Encounter (Signed)
Attempted to call patient regarding paperwork.   Wasn't successful, left a voicemail.   Informed patient paperwork was placed in an envelope and ready for pick up.   Left call back phone number just in case patient had questions or concerns.

## 2016-09-26 NOTE — Patient Instructions (Signed)
Repeat labs ordered

## 2016-09-26 NOTE — Telephone Encounter (Signed)
Forward to correct point person

## 2016-09-26 NOTE — Progress Notes (Signed)
CARDIOLOGY CLINIC   DATE: 09/26/16  Referring provider: [name]    CC: New Patient (New Internal Referral, HLD)      History of Presenting Illness:     Erik Burke is a 36 year old male with hyperlipidemia, hyperhydrosis, idiopathic dural tear (managed with blood patches and fibrin glue patches).    He comes in for management of hyperlipidemia. He tried Lipitor, pravastatin, Zetia, and Crestor (unsure of Crestor dose).  Muscle weakness and pain with all of those medications, including Zetia alone was to the degree that he could not put clothes on, had to miss work. Tried to get approval for PCSK9 inhibitors previously, although unsuccessful.  He was tested negative for FH mutations.   Mentions that heart rate tends to run high, often in 90's. Had skipped beats couple years ago, none recently.  Exercises 2 times a week on recumbent bike for 10-45 min, and PT, swims in the ocean 2-3 days a week during warm season.   Patient denies chest pain, shortness of breath, orthopnea, paroxysmal nocturnal dyspnea, lower extremity edema, claudication, pre-syncope and syncope.   Eats healthy, low cholesterol diet: oatmeal for breakfast, salad for lunch. Has 2-week old newborn at home.  Izyan brings in a detailed summary of his trials of different statins, and prior lipid panel results:      Diagnostics (I reviewed all the test results below today):  EKG 09/26/16 (interpreted by me):  NSR, HR84    ROS:  Constitutional: negative.    Eyes: negative.    Ears, Nose, Mouth, Throat: negative.    CV: as in HPI.  Resp: negative.    GI: negative.    GU: negative.    Musculoskeletal:negative.  Integumentary: negative.    Neuro: negative.    Psych: negative  Endo: negative.    Heme/Lymphatic: negative.    Allergy/Immun: negative.     Past Medical / Surgical History:  Past Medical History:   Diagnosis Date    Acne     Back pain     secondary to herniated disc, from back injury in 2000    Hyperhidrosis     propanolol  tx    Hyperlipidemia     LDL > 200, TG ~400 at Dx, statin intolerant.  Per pt, r/o for familial HDL    Parasomnia associated with sexual behavior     Tx with Caprice Renshaw, has been being downtitrated     Social History:  Social History     Social History    Marital status: Married     Spouse name: N/A    Number of children: N/A    Years of education: N/A     Occupational History    Not on file.     Social History Main Topics    Smoking status: Never Smoker    Smokeless tobacco: Never Used    Alcohol use Yes      Comment: 1-3 drinks infrequently    Drug use: Not on file    Sexual activity: Not on file     Other Topics Concern    Not on file     Social History Narrative    PhD in Psychology, works in [employer], [place] [employer]     Family History:  Family History   Problem Relation Age of Onset    Cancer Mother      cervical    No Known Problems Father     Cancer Maternal Grandmother  ovarian    No Known Cancer Maternal Grandfather     Heart Disease Maternal Grandfather     Cancer Paternal Grandmother      pancreatic    Cancer Paternal Grandfather      mesothelioma    Cancer Brother 89     osteosarcoma   Maternal grandfather - HLD, ?heart disease diagnosed in his 7's, family does not know details  Mother - HLD  Father - HLD on statin    Medications (medication list reviewed by me with the patient):  Current Outpatient Prescriptions   Medication Sig    acetaZOLAMIDE (DIAMOX) 250 MG tablet Take 1 tablet (250 mg) by mouth daily as needed (headache).    propranolol (INDERAL LA) 60 MG CP24 Take 1 capsule (60 mg) by mouth daily.    tretinoin (RETIN-A) 0.025 % cream Use a pea sized amount on the entire face nightly.  At first, you may use every other night to avoid dryness and irritation, and gradually increase use to nightly as tolerated     No current facility-administered medications for this visit.    propranolol for hyperhydrosis    Allergies:  Patient has No Known Allergies.    Physical Exam:  BP  130/80 (BP Location: Left arm, BP Patient Position: Sitting, BP cuff size: Regular)   Pulse 85   Temp 97.8 F (36.6 C) (Oral)   Resp 16   Ht '5\' 8"'$  (1.727 m)   Wt 76.7 kg (169 lb)   SpO2 100%   BMI 25.7 kg/m2 /    General: awake, alert, no acute distress  HEENT: NCAT, PERRL, EOMI  Neck: JVP ~7cm, no carotid bruits  Lungs: clear to auscultation bilaterally, no wheezing/crackles  Heart: RRR, nl S1S2, no murmurs, rubs or gallops  Abdomen: +BS, soft, non-tender, non-distended  Extremities: warm, well-perfused, no edema,  2+ symmetric distal pulses  Skin: dry, no rashes  Neuro: alert and oriented x3, no focal neurologic deficit    Lab Data (reviewed by me today):  Lab Results   Component Value Date    BUN 10 02/11/2016    CREAT 1.13 07/23/2016    CL 102 02/11/2016    NA 142 02/11/2016    K 4.1 02/11/2016    [place] 9.7 02/11/2016    TBILI 0.59 02/11/2016    ALB 4.8 02/11/2016    TP 7.3 02/11/2016    AST 19 02/11/2016    ALK 61 02/11/2016    BICARB 28 02/11/2016    ALT 21 02/11/2016    GLU 93 02/11/2016     Lab Results   Component Value Date    WBC 4.4 02/11/2016    RBC 5.04 02/11/2016    HGB 15.5 02/11/2016    HCT 44.9 02/11/2016    MCV 89.1 02/11/2016    MCHC 34.5 02/11/2016    RDW 11.5 (L) 02/11/2016    PLT 245 02/11/2016    MPV 9.3 (L) 02/11/2016     Lab Results   Component Value Date    CHOL 269 08/02/2016    HDL 45 08/02/2016    LDLCALC 177 (H) 08/02/2016    TRIG 235 (H) 08/02/2016     Lab Results   Component Value Date    TSH 1.16 02/11/2016       Assessment/Plan:  *hyperlipidemia:   *statin intolerance:  -He tried Lipitor, pravastatin, Zetia, and Crestor, all caused debilitating muscle weakness and pain, even in combination with Co-Q10.   -currently taking red yeast rice supplement, and tolerating  it with no symptoms - will repeat labs  -patient will submit records of [place] score and echo from [facility], Dr. Percival Spanish, reportedly had evidence of coronary calcification  -if LDL remains persistently elevated will try to  obtain prior authorization for Repatha    Orders Placed This Encounter   Procedures    Lipoprotein (a), Blood Yellow serum separator tube    Lipid Panel Green Plasma Separator Tube    LipoFit by NMR 2 Red plain     Patient Instructions   Repeat labs ordered.      Return in about 4 months (around 01/26/2017).    My above recommendations will be communicated with the referring physician by way of letter or the electronic medical record.    Please don't hesitate to page me should you have any questions regarding the care of this patient.      Charline Bills, MD    Assistant Clinical Professor of Medicine  Division of [facility]  Pager [phone]  [email or web address] .Bennie Pierini

## 2016-09-26 NOTE — Telephone Encounter (Signed)
Patient is requesting to know if submitted paperwork to verify if TB test was negative and ready for pick up. Patient states he dropped paperwork in office with his name and DOB.     Please refer to 09/17/16, patient thanks Dr. Selinda Eon via Deloris Ping for completing the TB paperwork. However, patient states he has not heard back from anybody in office. Patient states the due date to return documentation is coming up.    He is requesting a return call from Nurse. Please advise/assist patient.

## 2016-09-27 ENCOUNTER — Encounter (INDEPENDENT_AMBULATORY_CARE_PROVIDER_SITE_OTHER): Payer: Self-pay | Admitting: Cardiovascular Disease

## 2016-09-30 LAB — ECG 12-LEAD
ATRIAL RATE: 84 {beats}/min
ECG INTERPRETATION: NORMAL
P AXIS: 58 degrees
PR INTERVAL: 160 ms
QRS INTERVAL/DURATION: 88 ms
QT: 358 ms
QTC INTERVAL: 423 ms
R AXIS: 86 degrees
T AXIS: 64 degrees
VENTRICULAR RATE: 84 {beats}/min

## 2016-09-30 NOTE — Telephone Encounter (Signed)
From: [name]  To: Sherron Monday., MD  Sent: 09/27/2016 4:10 PM PDT  Subject: 20-Other    Hi Dr. Cherylann Ratel,    See attached for calcium score and report.    I realized I did not do an echocardiogram. Do you recommend ordering that? Happy to do what you think is best.    Thanks,  Erik Burke

## 2016-09-30 NOTE — Telephone Encounter (Signed)
mychart message to Dr. Cherylann Ratel

## 2016-10-02 NOTE — Telephone Encounter (Signed)
Coronary [place] report:    ADDENDUM REPORT: 12/31/2013 12:36  CLINICAL DATA: Risk stratification  EXAM:  Coronary Calcium Score  TECHNIQUE:  The patient was scanned on a Siemens Sensation 16 slice scanner.  Axial non-contrast 3 mm slices were carried out through the heart.  The data set was analyzed on a dedicated work station and scored  using the [place].  FINDINGS:  Non-cardiac: See separate report from [facility].  Ascending Aorta: Normal size.  Pericardium: Normal  Coronary arteries: Originating in normal position.  IMPRESSION:  Coronary calcium score of 6. This was 40 percentile for age and sex  matched control.  Ena Dawley  Electronically Signed  By: Ena Dawley  On: 12/31/2013 12:36    mychart    Thank you, Rodman Key! I do not think we need to do echo at this point.   I will be expecting your labs to see whether we could get a PCSK9 inhibitor for you.  Thanks!  -Charline Bills    ===View-only below this line===      ----- Message -----     From: Sheral Apley. [place]     Sent: 09/27/2016  4:10 PM PDT       To: Sherron Monday, MD  Subject: 20-Other    Hi Dr. Cherylann Ratel,    See attached for calcium score and report.    I realized I did not do an echocardiogram.  Do you recommend ordering that?  Happy to do what you think is best.    Thanks,  [name]

## 2016-10-03 ENCOUNTER — Encounter (HOSPITAL_BASED_OUTPATIENT_CLINIC_OR_DEPARTMENT_OTHER): Payer: Self-pay

## 2016-10-03 ENCOUNTER — Other Ambulatory Visit (HOSPITAL_BASED_OUTPATIENT_CLINIC_OR_DEPARTMENT_OTHER): Payer: Self-pay | Admitting: Pain Medicine

## 2016-10-03 DIAGNOSIS — R52 Pain, unspecified: Principal | ICD-10-CM

## 2016-10-04 ENCOUNTER — Ambulatory Visit
Admission: RE | Admit: 2016-10-04 | Discharge: 2016-10-04 | Disposition: A | Discharge status: Home / Self Care | Payer: Commercial Managed Care - HMO | Attending: Pain Medicine | Admitting: Pain Medicine

## 2016-10-04 ENCOUNTER — Encounter (INDEPENDENT_AMBULATORY_CARE_PROVIDER_SITE_OTHER): Payer: Self-pay | Admitting: Cardiovascular Disease

## 2016-10-04 ENCOUNTER — Encounter (HOSPITAL_COMMUNITY): Payer: Self-pay

## 2016-10-04 ENCOUNTER — Encounter (HOSPITAL_BASED_OUTPATIENT_CLINIC_OR_DEPARTMENT_OTHER): Admission: RE | Disposition: A | Discharge status: Home Health | Payer: Self-pay | Attending: Pain Medicine

## 2016-10-04 DIAGNOSIS — E785 Hyperlipidemia, unspecified: Secondary | ICD-10-CM | POA: Insufficient documentation

## 2016-10-04 DIAGNOSIS — M533 Sacrococcygeal disorders, not elsewhere classified: Secondary | ICD-10-CM

## 2016-10-04 DIAGNOSIS — R52 Pain, unspecified: Principal | ICD-10-CM | POA: Insufficient documentation

## 2016-10-04 DIAGNOSIS — M549 Dorsalgia, unspecified: Principal | ICD-10-CM | POA: Insufficient documentation

## 2016-10-04 SURGERY — PAIN SYMP NERVE BLOCK, GANGLION IMPAR

## 2016-10-04 MED ORDER — BUPIVACAINE HCL (PF) 0.25 % IJ SOLN
INTRAMUSCULAR | Status: DC | PRN
Start: 2016-10-04 — End: 2016-10-04
  Administered 2016-10-04: 3 mL

## 2016-10-04 MED ORDER — DEXAMETHASONE SODIUM PHOSPHATE 10 MG/ML IJ SOLN (CUSTOM)
INTRAMUSCULAR | Status: DC | PRN
Start: 2016-10-04 — End: 2016-10-04
  Administered 2016-10-04: 3 mg via INTRAMUSCULAR

## 2016-10-04 MED ORDER — IOHEXOL 240 MG/ML IJ SOLN
INTRAMUSCULAR | Status: DC | PRN
Start: 2016-10-04 — End: 2016-10-04
  Administered 2016-10-04: 1 mg

## 2016-10-04 MED ORDER — DEXAMETHASONE SOD PHOSPHATE PF 10 MG/ML IJ SOLN
INTRAMUSCULAR | Status: DC
Start: 2016-10-04 — End: 2016-10-05
  Filled 2016-10-04: qty 1

## 2016-10-04 MED ORDER — BUPIVACAINE HCL (PF) 0.25 % IJ SOLN
INTRAMUSCULAR | Status: DC
Start: 2016-10-04 — End: 2016-10-05
  Filled 2016-10-04: qty 30

## 2016-10-04 MED ORDER — IOHEXOL 240 MG/ML IJ SOLN
INTRAMUSCULAR | Status: DC
Start: 2016-10-04 — End: 2016-10-05
  Filled 2016-10-04: qty 10

## 2016-10-04 SURGICAL SUPPLY — 23 items
APPLICATOR CHLORAPREP 3ML, CLEAR (Misc Medical Supply) ×2 IMPLANT
CANNULA NASAL FLARED W/7' TUBE (Misc Medical Supply)
CATHETER IV INSYTE 18G X 1 1/4 (Needles/punch/cannula/biopsy) IMPLANT
CATHETER IV INSYTE 20G X 1 1/4 (Needles/punch/cannula/biopsy) IMPLANT
CATHETER IV INSYTE 22G X 1 (Needles/punch/cannula/biopsy) IMPLANT
CATHETER IV INSYTE 24G 3/4 (Needles/punch/cannula/biopsy)
COVER ULTRASOUND PROBE W/ GEL (Drapes/towels)
ELECTRODES ECG CLEARTRACE (Cautery) IMPLANT
KIT,IV START (Patient Care Supply) IMPLANT
LINE FILTER SMART CAPNOLINE O2 PLUS LONG ADULT (Misc Medical Supply)
MARKER SECURELINE SURG SKIN (Misc Medical Supply) ×2 IMPLANT
NEEDLE ECHOBLOCK MSK 21G X 3 1/8" (Needles/punch/cannula/biopsy) IMPLANT
NEEDLE ECHOBLOCK MSK 21G X 4" (Needles/punch/cannula/biopsy)
NEEDLE ECHOBLOCK MSK 22G X 2" (Needles/punch/cannula/biopsy) IMPLANT
NEEDLE SPINAL 22G X 7", QUINCKE (Needles/punch/cannula/biopsy)
NEEDLE SPINAL 22GA X 5 (Needles/punch/cannula/biopsy) IMPLANT
NEEDLE SPINAL BD 22G X 3.5" (Needles/punch/cannula/biopsy)
NEEDLE SPINAL BD 25G X 3.5" (Needles/punch/cannula/biopsy)
NEEDLE SPINE QUINCKE 23G X 6" (Needles/punch/cannula/biopsy) IMPLANT
SOLUTION IV 0.9% NS 1000ML (Non-Pharmacy Meds/Solutions)
TRAY SINGLE SHOT EPIDURAL (Procedure Packs/kits) ×2 IMPLANT
TUBING EXTENSION SET MICROBORE 20" (Tubing/Suction)
TUBING SET FOR ALARIS PUMP WITH 2 SMARTSITE PORTS (Tubing/Suction)

## 2016-10-04 NOTE — Discharge Instructions (Signed)
Patient verbalized understanding of below instructions    Procedure Done Today: Ganglion Impar Nerve Block    Patient will follow up in clinic.     Post Procedure Instructions  Continue present medication unless otherwise indicated.  Resume your normal diet after being discharge.  Ice pack to treatment site (no more than 20 minutes at a time) if needed for pain relief and/or muscle spasm.  Avoid strenuous activities and driving until tomorrow morning.  If you have a band-aid dressing, you may remove it tomorrow morning.  Resume normal activities tomorrow morning, unless otherwise directed.  No submersion in water for 24 hrs.  If your next appointment is at the Pain Clinic, then please call [phone] to make an appointment.  If your next appointment is another procedure, then please call [phone] to make an appointment.    Call the [facility],  [phone] and ask for the pain management provider on call for ANY sign of:  Temperature above 101.5 F  Redness or drainage at the treatment site  Severe, uncontrollable headache    Call [facility]

## 2016-10-04 NOTE — H&P (Signed)
Ambulatory Surgery/Invasive Procedure History and Physical      Primary Care Physician [name]    Chief Complaint:  buttock pain     36 year old male     BP 122/75   Pulse 70   Temp 98 F (36.7 C)   Resp 16   SpO2 100%    Past Medical History:   Diagnosis Date    Acne     Back pain     secondary to herniated disc, from back injury in 2000    Hyperhidrosis     propanolol tx    Hyperlipidemia     LDL > 200, TG ~400 at Dx, statin intolerant.  Per pt, r/o for familial HDL    Parasomnia associated with sexual behavior     Tx with Klonapin, has been being downtitrated       No Known Allergies    Current Facility-Administered Medications   Medication Dose Route Frequency Provider Last Rate Last Dose    bupivacaine 0.25 % PF injection  - ADS OVERRIDE             dexamethasone (DECADRON) 10 MG/ML PF injection  - ADS OVERRIDE             iohexol (OMNIPAQUE 240) 240 MG/ML solution  - ADS OVERRIDE                I have reviewed the past medical history, allergies and current medications as documented in the electronic health record.      Physical Exam       Can this patient make their own healthcare decisions?  Yes    Chest:  Breaths easily     Heart:  RRR    Abdomen:  Soft    Pain Management Needs/Options discussed.    No Advanced Directives.      Resuscitative Status:  Full Code, Full Care    Diagnosis:cocygeal pain    Procedure: Trigger point injection    The procedure is designed to be diagnostic.    Discussed Risks, Benefits, and Alternatives to procedure.  Questions answered.  Patient voiced understanding and wished to proceed. Consent Signed.    See procedure note of same date.

## 2016-10-04 NOTE — Op Note (Signed)
Procedure Note, [facility]    Preoperative Diagnosis:   Sympathetic pain    Postoperative Diagnosis: Sympathetic pain    Procedure:  1. 1 muscle group trigger point injection      Surgeon/Staff:  Christella Scheuermann Caylie Sandquist                                        Indications: The patient c/o tenderness and discomfort with palpation of coccygeal ligament .    Procedure in detail:  Written informed consent was obtained.  The chart was reviewed, questions were answered and the patient wished to proceed. The patient had no contraindications to the procedure.  Vital signs were stable. Standard monitoring was applied.  The patient, the procedure nurse, and the physician confirmed the sites of injection after an official "time out." The skin over the injection site was also marked and confirmed as the site of the injection.    Localization Time Out:  An additional intraoperative timeout, specifically to confirm accurate localization, was conducted by the attending physician.     The patient remained awake and alert throughout the procedure.  The patient was placed in the prone position.  The skin was prepped with chlorhexidine and sterile drapes were applied. Fluroscopy was used to identify the cocyx.  Next a 25 g 1.5 needle was advanced between the coccyx and caudal aspect of the sacrum.  Injection of omnipaque 240 intially showed spread over the dorsal aspect.  The needle was advance slightly and Omniqaue 240 then showed spread over the ventral aspect.  Next a solution containing Dexamethasone (10mg /mL)  0.25 mL and Bupivicaine (0.25%)  2 mL was prepared and injected in increments.    No paraesthesias occurred and aspiration was negative.  The needle was removed.  A sterile bandage was applied.  The patient did not experience any hemodynamic or neurologic sequelae.       The patient was transferred to the recovery area. Post operative instructions were explained and given to the patient.  The patient was  discharged in stable health and given a follow up clinic appt. in 3 week(s)

## 2016-10-08 ENCOUNTER — Encounter (HOSPITAL_BASED_OUTPATIENT_CLINIC_OR_DEPARTMENT_OTHER): Payer: Self-pay | Admitting: Pain Medicine

## 2016-10-09 ENCOUNTER — Encounter (HOSPITAL_BASED_OUTPATIENT_CLINIC_OR_DEPARTMENT_OTHER): Payer: Self-pay | Admitting: Pain Medicine

## 2016-10-09 ENCOUNTER — Encounter (INDEPENDENT_AMBULATORY_CARE_PROVIDER_SITE_OTHER): Payer: Self-pay

## 2016-10-12 ENCOUNTER — Encounter (HOSPITAL_BASED_OUTPATIENT_CLINIC_OR_DEPARTMENT_OTHER): Payer: Self-pay | Admitting: Pain Medicine

## 2016-10-12 ENCOUNTER — Telehealth (INDEPENDENT_AMBULATORY_CARE_PROVIDER_SITE_OTHER): Payer: Self-pay | Admitting: Dermatology

## 2016-10-12 DIAGNOSIS — M533 Sacrococcygeal disorders, not elsewhere classified: Principal | ICD-10-CM

## 2016-10-12 NOTE — Telephone Encounter (Signed)
From: [name]  To: Danae Chen, MD  Sent: 10/08/2016 10:26 PM PDT  Subject: 3-Referral Request Status    Hello Dr. Trinna Post,    Checking in about renewal of referral to physical therapy. Thank you!    Erik Burke

## 2016-10-12 NOTE — Telephone Encounter (Signed)
Pt called to inform Dr. Gwynneth Munson lately he's experienced a reaction to adhesive tape and wants to know if MD can submit a Referral to either Dr. Brigitte Pulse or Allergy for patch testing to confirm the ingredient causing this rash every time. 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 Please contact pt with further triage questions or concerns.  Thank you

## 2016-10-12 NOTE — Telephone Encounter (Signed)
From: [name]  To: Danae Chen, MD  Sent: 10/09/2016 10:20 PM PDT  Subject: 1-Non Urgent Medical Advice    Hi Dr. Trinna Post,    Thank you very much for attempting the ganglion impar block. I had been on parent leave for about a month (my wife gave birth to our first child), and the sacrum and coccyx pain was about level 1-4 during that time (primarily low end of that range). I returned to work on Monday. 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 I spent substantial sitting time on Monday, and since that time, the sacrum/coccyx (primarily sacrum) has been at a level 7. I've tried stretching, swimming, acetaminophen, ibuprofen, warm compress, cold compress. It feels like pervasive swollen pressure/tightness in the sacrum area, as well as referred pain-- that electrical/shooting pain localized to sacrum area, and the sacrum feels as if it's on fire with this sensation. I wanted to take this opportunity to let you know about this in an attempt toward diagnostic clarity and ask what additional treatment recommendations you had. This is very  uncomfortable; I didn't have this sensational at all prior to onset (Spring 2017). Is there some type of steroid/anesthesia injection above the sacrum in hopes of targeting/addressing all aforementioned pain below that point (i.e., in the sacrum area)? Thank you, Erik Burke

## 2016-10-15 ENCOUNTER — Other Ambulatory Visit (INDEPENDENT_AMBULATORY_CARE_PROVIDER_SITE_OTHER): Payer: Commercial Managed Care - HMO | Attending: Cardiovascular Disease

## 2016-10-15 ENCOUNTER — Encounter (INDEPENDENT_AMBULATORY_CARE_PROVIDER_SITE_OTHER): Payer: Self-pay | Admitting: Dermatology

## 2016-10-15 DIAGNOSIS — E782 Mixed hyperlipidemia: Principal | ICD-10-CM

## 2016-10-15 LAB — LIPOPROTEIN(A), BLOOD: Lipoprotein(a): 18 mg/dL (ref 0–29)

## 2016-10-15 NOTE — Interdisciplinary (Signed)
Blood drawn from left arm with 23 gauge needle. 3 tubes taken.   Patient identity authenticated by Laurence J Orgaya.

## 2016-10-15 NOTE — Telephone Encounter (Signed)
Returned call, informed patient to avoid adhesives that cause the rash.  Can try hypoallergenic products.  He will try to identify which products cause rash, and follow up in my clinic PRN.

## 2016-10-16 ENCOUNTER — Encounter (HOSPITAL_BASED_OUTPATIENT_CLINIC_OR_DEPARTMENT_OTHER): Payer: Self-pay | Admitting: Pain Medicine

## 2016-10-16 ENCOUNTER — Encounter (INDEPENDENT_AMBULATORY_CARE_PROVIDER_SITE_OTHER): Payer: Self-pay | Admitting: Internal Medicine

## 2016-10-16 NOTE — Telephone Encounter (Signed)
From: Erik Burke  To: Erik Chen, MD  Sent: 10/16/2016 9:58 AM PDT  Subject: 1-Non Urgent Medical Advice    Thanks for the congrats!    I appreciate you working with me.     It seemed like pain got worse after returning to work, rather than immediately following injection (I returned to work about 4 days after injection). It wasn't gradually getting worse per se, seems more of an abrupt onset after returning to work. I'm wondering if it's related to extended time sitting. I also had a trial of red yeast rice to address cholesterol, and I'm wondering if that had a statin-like impact, although maybe less likely.     I'm open to medication, and would like to try one more thing, if possible, a CT-guided epidural. I had 4 previously for temporary pain at different locations with subsequent elimination of symptoms in those respective locations.    Erik Burke in [place] trained under Erik Burke at [facility], where I received majority of epidurals, patching for dural tears, and laminectomy/discectomy, all with successful outcomes. Would a referral be possible to Erik Burke?     I looked into Up To Date. What are your thoughts on Tricyclics vs. SNRIs? I'm slightly more open to SNRI given literature on side effects of former (and observation in my own patients), but open to discussing either and trust your judgment.     My fellowship ends in July but I'm planning to stay in the area with my family.     Thank you,  Erik Burke          ----- Message -----  From: Erik Chen, MD  Sent: 10/12/16, 3:01 PM  To: Erik Burke  Subject: RE: 1-Non Urgent Medical Advice    First congratulation on the new baby. I forgot you mentioned it at our last visit.    Returning to work is always hard. Did the pain get worse after the injection or was it getting that way before?    I ordered the physical therapy at [facility].    I know you are resistant but it might be time to try the medications we  disc. I would recommend starting low and titrating slowly    Erik Burke    ----- Message -----   From: Erik Burke   Sent: 10/09/2016 10:20 PM PDT   To: Erik Chen, MD  Subject: 1-Non Urgent Medical Advice    Hi Dr. Trinna Burke,    Thank you very much for attempting the ganglion impar block. I had been on parent leave for about a month (my wife gave birth to our first child), and the sacrum and coccyx pain was about level 1-4 during that time (primarily low end of that range). I returned to work on Monday. 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 I spent substantial sitting time on Monday, and since that time, the sacrum/coccyx (primarily sacrum) has been at a level 7. I've tried stretching, swimming, acetaminophen, ibuprofen, warm compress, cold compress. It feels like pervasive swollen pressure/tightness in the sacrum area, as well as referred pain-- that electrical/shooting pain localized to sacrum area, and the sacrum feels as if it's on fire with this sensation. I wanted to take this opportunity to let you know about this in an attempt toward diagnostic clarity and ask what additional treatment recommendations you had. This is very  uncomfortable;  I didn't have this sensational at all prior to onset (Spring 2017). Is there some type of steroid/anesthesia injection above the sacrum in hopes of targeting/addressing all aforementioned pain below that point (i.e., in the sacrum area)? Thank you, Erik Burke

## 2016-10-16 NOTE — Telephone Encounter (Signed)
From: [name]  To: Delbert Phenix  Sent: 10/15/2016 5:15 PM PDT  Subject: 1-Non Urgent Medical Advice    Hi Dr. Gwynneth Munson,    Here are the ingredients in the spray, according to the administrative assistant:    Isotropyl alcohol, n-butane, propane, rosin      Thank you,  [name]

## 2016-10-18 ENCOUNTER — Telehealth (HOSPITAL_BASED_OUTPATIENT_CLINIC_OR_DEPARTMENT_OTHER): Payer: Self-pay

## 2016-10-18 ENCOUNTER — Encounter (HOSPITAL_BASED_OUTPATIENT_CLINIC_OR_DEPARTMENT_OTHER): Payer: Self-pay

## 2016-10-18 LAB — LIPOFIT BY NMR
HDL Cholesterol: 50 mg/dL (ref 40–59)
HDL Particle Number: 32.5 umol/L — ABNORMAL LOW (ref >=33.0)
HDL Size: 8.6 nm — ABNORMAL LOW (ref >=8.9)
LDL Cholesterol: 153 mg/dL — ABNORMAL HIGH (ref <=129)
LDL Particle Number by NMR: 1909 nmol/L — ABNORMAL HIGH (ref <=1135)
LDL Particle Size: 20.9 nm (ref >=20.7)
Large HDL Particle Number: <2.8 umol/L — ABNORMAL LOW (ref >=4.2)
Large VLDL Particle Number: <1.5 nmol/L (ref <=2.7)
Small LDL Particle Number: >1085 nmol/L — ABNORMAL HIGH (ref <=634)
Total Cholesterol: 243 mg/dL — ABNORMAL HIGH (ref <=199)
Triglycerides: 198 mg/dL — ABNORMAL HIGH (ref 30–149)
VLDL Size: 48.8 nm — ABNORMAL HIGH (ref <=46.7)

## 2016-10-18 NOTE — Telephone Encounter (Signed)
Routed to MD as FYI/Advise if needed.

## 2016-10-18 NOTE — Telephone Encounter (Signed)
I have attempted to reach the patient and schedule a therapy appointment. I was able to leave a message and provide our main line [phone], so the patient may call us back to schedule at their earliest convenience.       Thank you,  Rehabilitation Services

## 2016-10-18 NOTE — Telephone Encounter (Signed)
From: [name]  To: [name], MD  Sent: 10/16/2016 6:21 PM PDT  Subject: 20-Other    Hi Dr. Selinda Eon,    See message to me from Dr. Trinna Post below (you can also view message thread). We can discuss on Monday during appointment.    In summary, the neuroradiologist at [facility] from whom I received treatment (numerous epidurals, dural patching, also had laminectomy/discectomy at [facility]) recommended an epidural for current sacrum/coccyx pain (I can show you my texts with her). She trained a practitioner who is currently in [facility], Dr. Lennette Bihari, and recommended this person for CT-guided epidural. Sounds like Dr. Trinna Post doesn't have access to a machine for an equivalent procedure. Dr. Trinna Post is thinking course of medication to address sacral pain but I'd like to try a one-time epidural before starting meds. Sounded like he was ok with that and thought you might be able to provide a referral. Is that possible? Thanks for your consideration.     "  ...    I think clinically it is a toss up as to want to start first TCA or Anticonvulsant. The tca is easier to take because it is once per day. But maybe see slightly less problems with sedation with gabapentin. I think up to date seems to favor TCA a little bit more.     Going up to [facility] would be fine. I do not have access to CT scanner but would think that your primary should send referral.     GRP  "      Best,  [name]

## 2016-10-19 ENCOUNTER — Encounter (HOSPITAL_COMMUNITY): Payer: Self-pay | Admitting: Cardiovascular Disease

## 2016-10-19 ENCOUNTER — Encounter (INDEPENDENT_AMBULATORY_CARE_PROVIDER_SITE_OTHER): Payer: Commercial Managed Care - HMO | Admitting: Internal Medicine

## 2016-10-19 ENCOUNTER — Telehealth (HOSPITAL_COMMUNITY): Payer: Self-pay | Admitting: Cardiovascular Disease

## 2016-10-19 DIAGNOSIS — E782 Mixed hyperlipidemia: Principal | ICD-10-CM

## 2016-10-19 DIAGNOSIS — E7800 Pure hypercholesterolemia, unspecified: Secondary | ICD-10-CM

## 2016-10-19 MED ORDER — EVOLOCUMAB 140 MG/ML SC SOSY
140.0000 mg | PREFILLED_SYRINGE | SUBCUTANEOUS | 3 refills | Status: DC
Start: 2016-10-19 — End: 2017-03-18

## 2016-10-19 NOTE — Telephone Encounter (Signed)
Prior Erik Burke has been started via Cover My Meds.  Awaiting decision.

## 2016-10-19 NOTE — Telephone Encounter (Signed)
From: [name]  To: Sherron Monday., MD  Sent: 10/19/2016 10:45 AM PDT  Subject: 1-Non Urgent Medical Advice    Hi Dr. Cherylann Ratel,    I just spoke with Maudie Mercury on the phone and she recommended I email you directly. Given the recent results, should I just stop taking red yeast rice? Looks like it had no significant impact on LDL. I'm fine not taking it.     Thanks!  Erik Burke

## 2016-10-19 NOTE — Progress Notes (Signed)
[name], LDL remains high, I will try to submit for prior authorization for Repatha.   You could contact Maudie Mercury, my RN in [place] at [phone] if you have questions.    Thank you!  Charline Bills, MD  Assistant Professor of Medicine  Division of Cardiovascular Medicine

## 2016-10-19 NOTE — Telephone Encounter (Signed)
The patient already tried Lipitor, pravastatin, Zetia, and Crestor, and all caused debilitating myalgias. Recommend transitioning to [place], as LDL 153, and the patient has evidence of coronary calcification (87 percentile for age and sex), see below. Could start with 140 q2 weeks.  Continue dietary modifications.      Coronary [place] report:    ADDENDUM REPORT: 12/31/2013 12:36  CLINICAL DATA: Risk stratification  EXAM:  Coronary Calcium Score  TECHNIQUE:  The patient was scanned on a Siemens Sensation 16 slice scanner.  Axial non-contrast 3 mm slices were carried out through the heart.  The data set was analyzed on a dedicated work station and scored  using the [place].  FINDINGS:  Non-cardiac: See separate report from [facility].  Ascending Aorta: Normal size.  Pericardium: Normal  Coronary arteries: Originating in normal position.  IMPRESSION:  Coronary calcium score of 6. This was 19 percentile for age and sex  matched control.  Ena Dawley  Electronically Signed  By: Ena Dawley  On: 12/31/2013 12:36

## 2016-10-19 NOTE — Telephone Encounter (Signed)
Spoke with patient.  Will route message regarding Red Yeast Rice

## 2016-10-22 ENCOUNTER — Ambulatory Visit (INDEPENDENT_AMBULATORY_CARE_PROVIDER_SITE_OTHER): Payer: Commercial Managed Care - HMO | Admitting: Internal Medicine

## 2016-10-22 VITALS — BP 116/72 | HR 70 | Temp 98.6°F | Resp 14 | Ht 68.0 in | Wt 170.0 lb

## 2016-10-22 DIAGNOSIS — Z1331 Encounter for screening for depression: Secondary | ICD-10-CM

## 2016-10-22 DIAGNOSIS — M533 Sacrococcygeal disorders, not elsewhere classified: Principal | ICD-10-CM

## 2016-10-22 DIAGNOSIS — Z1389 Encounter for screening for other disorder: Secondary | ICD-10-CM

## 2016-10-22 DIAGNOSIS — G4750 Parasomnia, unspecified: Secondary | ICD-10-CM

## 2016-10-22 DIAGNOSIS — G9611 Dural tear: Secondary | ICD-10-CM

## 2016-10-22 MED ORDER — CLONAZEPAM 0.5 MG OR TABS
0.2500 mg | ORAL_TABLET | Freq: Every evening | ORAL | 0 refills | Status: DC
Start: 2016-10-22 — End: 2018-04-04

## 2016-10-22 NOTE — Patient Instructions (Signed)
Let me know if there are problems getting to see Dr. Konrad Dolores    Best of luck with your practice

## 2016-10-22 NOTE — Telephone Encounter (Signed)
mychart    Yes, no need to take it, I am, in general, not in favor of that supplement.  Charline Bills, MD  Assistant Professor of Medicine  Division of Cardiovascular Medicine  [email or web address]    ===View-only below this line===      ----- Message -----     From: Sheral Apley Kudo     Sent: 10/19/2016 10:45 AM PDT       To: Sherron Monday, MD  Subject: 1-Non Urgent Medical Advice    Hi Dr. Cherylann Ratel,    I just spoke with Maudie Mercury on the phone and she recommended I email you directly. Given the recent results, should I just stop taking red yeast rice? Looks like it had no significant impact on LDL. I'm fine not taking it.     Thanks!  [name]

## 2016-10-22 NOTE — Progress Notes (Signed)
Primary Care FOLLOW UP VISIT    Routine Follow Up Appointment: PRIMARY CARE    Erik Burke is a 36 year old year old male with a who comes in for routine follow up  The patient has presents for Other    36 YO man with a history of HLD, parasomnia, hyperhidrosis, and chronic coccyx/sacral pain w/ radiation presents to followup.  He reports doing well overall except worsening pains / sarcal discomfort in the c/o a complicated history.    1. Pain - seen by pain management/Dr. Trinna Post, ortho and ortho onc.  Considered TCA/SNRI/Anticonvulsants. Also considered repeat injection in [facility], Dr. Lennette Bihari given historical training with his previous providers at [facility] (which he had good pain relief/success).  He is interested in learning about medications but apprehensive to trail these / start today.    2. Parasomnia - on benzo, low dose, has been weaning down. Ran out 2 weeks ago and has woken wife up twice because of this.  Awknowledges medication helps, but continues to be interested in weaning.      I ran the patient's name and date-of-birth through the [place] website which will list controlled drug prescriptions filled at [facility] in the past 6 months. The information in the patient's CURES report is consistent with their self-report and shows no evidence of controlled substance abuse or doctor shopping.  In addition, we have discussed alternatives to therapy as outlined in CURES under this provider's name.  Patient has had continued benefit to this, and trialed alternatives without success and opts to continue the current regimen.  Long-term use was also discussed   --UTox within 12 months None - will be due at next encounter if continued need  --Controlled Contract no given continued attempts to wean and lessening use           Review of Systems:  Review of Systems   Constitutional: Negative for malaise/fatigue.      Musculoskeletal: Positive for back pain and joint pain.   Psychiatric/Behavioral: The patient does not have insomnia.        Past Medical History:  Patient Active Problem List   Diagnosis    Hyperlipidemia    Back pain    Parasomnia    Acne, unspecified acne type    Hyperhidrosis    Dural tear    Postlaminectomy syndrome of lumbar region    Coccydynia       Medications were reviewed and updated on the Active Medication List    Medications:    Current Outpatient Prescriptions:     acetaZOLAMIDE (DIAMOX) 250 MG tablet, Take 1 tablet (250 mg) by mouth daily as needed (headache)., Disp: , Rfl:     clonazePAM (KLONOPIN) 0.5 MG tablet, Take 0.5 tablets (0.25 mg) by mouth at bedtime., Disp: 15 tablet, Rfl: 0    Evolocumab 140 MG/ML SOSY, Inject 140 mg under the skin every 14 days., Disp: 2 Syringe, Rfl: 3    propranolol (INDERAL LA) 60 MG CP24, Take 1 capsule (60 mg) by mouth daily., Disp: 90 each, Rfl: 1    tretinoin (RETIN-A) 0.025 % cream, Use a pea sized amount on the entire face nightly.  At first, you may use every other night to avoid dryness and irritation, and gradually increase use to nightly as tolerated, Disp: 45 g, Rfl: 3    Psychosocial:  Social History     Social History    Marital status: Married  Spouse name: N/A    Number of children: N/A    Years of education: N/A     Occupational History    Not on file.     Social History Main Topics    Smoking status: Never Smoker    Smokeless tobacco: Never Used    Alcohol use Yes      Comment: 1-3 drinks infrequently    Drug use: Not on file    Sexual activity: Not on file     Other Topics Concern    Not on file     Social History Narrative    PhD in Psychology, works in [employer], [place]       Allergies   Allergen Reactions    Atorvastatin Other     Debilitating myalgias    Pravastatin Other     Debilitating myalgias    Rosuvastatin Other     Debilitating myalgias    Zetia [Ezetimibe] Other     Debilitating myalgias       The  Family History:  Family History   Problem Relation Age of Onset    Cancer Mother      cervical    No Known Problems Father     Cancer Maternal Grandmother      ovarian    No Known Cancer Maternal Grandfather     Heart Disease Maternal Grandfather     Cancer Paternal Grandmother      pancreatic    Cancer Paternal Grandfather      mesothelioma    Cancer Brother 17     osteosarcoma       Physical Exam:  BP 116/72 (BP Location: Left arm, BP Patient Position: Sitting, BP cuff size: Large)   Pulse 70   Temp 98.6 F (37 C) (Oral)   Resp 14   Ht _0  (1.727 m)   Wt 77.1 kg (170 lb)   SpO2 98%   BMI 25.85 kg/m2  Body mass index is 25.85 kg/(m^2).  Physical Exam   Constitutional: He is well-developed, well-nourished, and in no distress. No distress.   HENT:   Head: Normocephalic and atraumatic.   Eyes: Conjunctivae are normal. No scleral icterus.   Neurological: He is alert. Gait normal.   Skin: Skin is warm and dry. He is not diaphoretic.   Psychiatric: Affect normal.   Vitals reviewed.      Studies  Lab Results   Component Value Date    BUN 10 02/11/2016    CREAT 1.13 07/23/2016    CL 102 02/11/2016    NA 142 02/11/2016    K 4.1 02/11/2016    [place] 9.7 02/11/2016    TBILI 0.59 02/11/2016    ALB 4.8 02/11/2016    TP 7.3 02/11/2016    AST 19 02/11/2016    ALK 61 02/11/2016    BICARB 28 02/11/2016    ALT 21 02/11/2016    GLU 93 02/11/2016     No results found for: A1C  Lab Results   Component Value Date    AST 19 02/11/2016    ALT 21 02/11/2016    ALK 61 02/11/2016    TP 7.3 02/11/2016    ALB 4.8 02/11/2016    TBILI 0.59 02/11/2016     Lab Results   Component Value Date    CHOL 243 (H) 10/15/2016    CHOL 269 08/02/2016    HDL 50 10/15/2016    HDL 45 08/02/2016    LDLCALC 177 (H) 08/02/2016  TRIG 198 (H) 10/15/2016    TRIG 235 (H) 08/02/2016     No results found for: GFRAA  Lab Results   Component Value Date    WBC 4.4 02/11/2016    RBC 5.04 02/11/2016    HGB 15.5 02/11/2016    HCT 44.9 02/11/2016    MCV 89.1  02/11/2016    RDW 11.5 (L) 02/11/2016    PLT 245 02/11/2016    LYMPHS 42 02/11/2016    MONOS 10 02/11/2016    EOS 6 02/11/2016    BASOS 1 02/11/2016     No results found for: INR  No results found for: COLORUA, APPEARUA, [place], BILIUA, KETONEUA, [place], BLOODUA, [place], PROTEINUA, UROBILUA, NITRITEUA, LEUKESTUA, WBCUA, RBCUA, EPITHCELLSUA, HYALINEUA, CRYSTALSUA, COMMENTSUA    The ASCVD Risk score Mikey Bussing DC Jr, et al., 2013) failed to calculate for the following reasons:    The 2013 ASCVD risk score is only valid for ages 11 to 74      Imaging: N/A      Health Maintenance: was reviewed, key updates UTD    ASSESSMENT AND PLAN  Erik Burke is a 36 year old year old male with a who comes in for routine follow up    Diagnoses and all orders for this visit:    Coccydynia - Noted chronic sacral pain without relief.  Has been seen by ortho, pain clinic.  D/w pt options like TCAs (which may help with sedation/sleep in an attempt to lower benzo), SNRI (for neuropathic pains), and gabapentin/lyrica.  [name] wishes to see Dr. Konrad Dolores first, consider management there and will return to discuss these if pain continues - which seems reasonable given the chronic nature of the pain and the likely need for long-term use of medications  -     Other Clinic    Dural tear    Parasomnia - Continue to wean  -     clonazePAM (KLONOPIN) 0.5 MG tablet; Take 0.5 tablets (0.25 mg) by mouth at bedtime.    Screened negative for depression        Patient Instructions   Let me know if there are problems getting to see Dr. Konrad Dolores    Best of luck with your practice         Orders this encounter:  Lab No orders of the defined types were placed in this encounter.    Imaging No orders of the defined types were placed in this encounter.    Procedures No orders of the defined types were placed in this encounter.    Other   Orders Placed This Encounter   Procedures    Other [place]  Return to clinic in  59months) for followup    .

## 2016-10-25 ENCOUNTER — Encounter (HOSPITAL_BASED_OUTPATIENT_CLINIC_OR_DEPARTMENT_OTHER): Payer: Self-pay | Admitting: Pain Medicine

## 2016-10-26 ENCOUNTER — Encounter (HOSPITAL_BASED_OUTPATIENT_CLINIC_OR_DEPARTMENT_OTHER): Payer: Self-pay | Admitting: Pain Medicine

## 2016-10-26 NOTE — Telephone Encounter (Signed)
Please call patient who called inquiring about prior auth decision for repatha started on 10/19/16

## 2016-10-29 ENCOUNTER — Ambulatory Visit: Payer: Commercial Managed Care - HMO | Attending: Pain Medicine | Admitting: Physical Therapy

## 2016-10-29 DIAGNOSIS — M533 Sacrococcygeal disorders, not elsewhere classified: Principal | ICD-10-CM | POA: Insufficient documentation

## 2016-10-29 NOTE — Telephone Encounter (Signed)
Checked with Cover My Meds.  Answered further questions.

## 2016-10-29 NOTE — Interdisciplinary (Signed)
Physical Therapy Evaluation        Referring Physician: Danae Chen         Outpatient Diagnosis     ICD-10-CM ICD-9-CM    1. Coccydynia M53.3 724.79      Start of Care: 10/29/16  Onset date : Ongoing for years  Preferred Language:English         Assessment  Assessment : "Erik Burke" is a pleasant 36 year old male who has a chief complaint of chronic coccygeal pain which is worsened with sitting. His numbness and tingling has extended into bilateral posterior legs at times and he still has pain/discomfort extending distal from his coccyx. He was negative to all SIJ tests today but was positive to slump test with replication of symptoms. He has performed nerve slides in the past but with only mild benefit so today we attempted a long sit nerve stretch but patient was guided thoroughly on the amount of pressure to feel and the appropriate application of this stretch to avoid any form of nerve traction injury. Patient agreed to discussions and stated desire to perform to work on anything to reduce symptoms. Review the benefit of this stretch at next apt.   Rehab Potential: Good        Patient History   Medical History  Dominant side: Right  History of presenting condition: states that he has had an injection recently in the coccyx region which had no effect. next step is an epidural injection for the pain. Hopes to review exercise regimen. With sitting he gets more NT but doesn't happen until after 3 hours   Previous treatment for condition: Injection;Outpatient Occupational Therapy  Diagnostic Tests: CT;MRI;X-Ray  Diagnostic test comments : See Report  Past Medical History:   Diagnosis Date    Acne     Back pain     secondary to herniated disc, from back injury in 2000    Hyperhidrosis     propanolol tx    Hyperlipidemia     LDL > 200, TG ~400 at Dx, statin intolerant.  Per pt, r/o for familial HDL    Parasomnia associated with sexual behavior     Tx with Caprice Renshaw, has been being downtitrated     Past  Surgical History:   Procedure Laterality Date    epidural steroid injection      x5 ESIs, most ecent 2017    l3/4 laminectomy  2014    @ [facility], 2014     [place]   Symptoms present in the last 6 months : Numbness or tingling          Subjective       Pain Assessment       Most Recent Value    Pain Asssessment Tool Numeric Pain Rating Scale    Pain Intensity - rating at present 1    Pain Intensity - rating at worst  4    Frequency  Intermittent    Description  Sharp, Dull, Aching    Location  Coccyx and down into R LE    Pain Description pulsing, throbbing    Exacerbating Factors Other (comments) sitting for long periods of time    Relieving Factors - better with movement                   Objective      Objective    LUMBAR OBJECTIVE EXAM      Range of Motion   Lumbar   Right (degrees) Left (  degrees)   Flexion To toes    Extension 25% limited    Rotation wnl wnl   Sidebend wnl wnl   *=Pain      Lumbar Special Tests   Right Left   Fortin Sign negative negative   Lumbar Quadrant Test negative negative   Cross Legged Straight Raise  negative negative   Straight Leg Raise negative negative   Prone Instability Test not done not done   FABER negative negative   FADIR negative negative       Sacroiliac Joint Testing  SIJ Distraction negative negative   SIJ Compression negative negative   Gaenslens not done not done   Thigh Thrust negative negative   Sacral Thrust negative negative         Radiculopathy Testing  Prone Knee bend not done not done   Slump positive: replication of symptoms positive: replication of symptoms            Manual Assessment  Palpation: unremarkable   Joint Mobility: pain with Gr III-IV movement of sacrum     Hip rotation  ASIS; equal  PSIS; equal  Leg length: no rotation noted          Plan  The plan of care was developed in conjunction with the patients goals. It was reviewed with the patient, including a review of the physical findings, proposed treatment, frequency and duration of  treatment sessions, precautions, limitations and expected outcomes. The patient acknowledged understanding of all of the above and agreed to the treatment plan as stated.    Patient Goals :to reduce pain and symptoms  Therapy Goals                                                                                        Current Level Goals for Episode of Care    Functional Deficit     Long term Functional Goal  Goal (Long Term) : Patient will have a thorough home exercise program designed to incorporate core and hip stability to improve SIj alignment and reduce strain/stress placed on the lower back and sacral region.   No. visits: 1-3  Goal status: New   Impairment #1 Education Short Term Goal #1 Impairment: Education  Education: Patient able to return demonstrate Home Exercise Program independently to enable patient to achieve stated functional goal  No. visits: 1-3  Goal status: New   Impairment #2 Other Impairment Short Term Goal #2 Impairment : Other Impairment  Custom goal: Improve neural tension with nervel slides/glides as indicated by reduced tension felt during slump test  No. visits: 1-3  Goal status: New   Functional Limitation Reporting    Rationale: Acceptable functional assessment;Clinical judgement  Visit type: Initial ongoing  Impairment Category: Mobility  Mobility: Walking and Moving Around Current Status [phone]): CJ 20-39% impaired, limited or restricted  Mobility: Walking and Moving Around Goal Status [phone]): CI 1%-19% impaired, limited or restricted  Functional Assessment Tool  Rationale: Acceptable functional assessment;Clinical judgement     Outpatient Treatment Plan  OP Treatment Frequency: Other (comment) (1 additional appointment)  OP Treatment Duration: 2 weeks  OP Status of treatment: Other (comment) (Patient  will schedule 1 follow up appointment based on pt)                Treatment Today  Type of Eval  Low Complexity (83818): Completed  Therapeutic Procedures  Therapeutic exercise   [phone]) : Patient education;Range of motion exercises;Strengthening exercises     Total TIMED Treatment (min) : 15    Therapeutic exercise  : reviewed prior exercises, peformed bridging and DKTC, performed no slump sit for neural tension    Patient was educated on home exercise program including appropriate cueing and interventions. See scanned copy in the patients electronic medical record.                 Treatment Time   Total TIMED Treatment  (min): 15  Total Treatment Time (min): 45      The physical therapist of record is endorsed by evaluating physical therapist.

## 2016-10-31 NOTE — Telephone Encounter (Signed)
Spoke with Dr. Cherylann Ratel in clinic.  Pt can try Crestor 5 mg every other day.    Called pt.  NA. Left message requesting a return call to go over denial.

## 2016-10-31 NOTE — Telephone Encounter (Signed)
Received denial fax from [place] for [place].  Will scan to media for review.  Denial based on-  1.  No LDL in the last 30 days  2.  No documented history of ASCVD  3.  No documented statin rechallenge- see denial fax for specifics.    Routing to Dr. Cherylann Ratel to review and advise.

## 2016-11-02 NOTE — Telephone Encounter (Signed)
Spoke with patient.  Pt agreed to try Crestor.  Will route to Dr. Cherylann Ratel for order.

## 2016-11-05 MED ORDER — ROSUVASTATIN CALCIUM 5 MG OR TABS
5.0000 mg | ORAL_TABLET | ORAL | 1 refills | Status: DC
Start: 2016-11-05 — End: 2016-12-27

## 2016-11-05 NOTE — Telephone Encounter (Signed)
mychart    Hi [name], I sent prescription for rosuvastatin every other night. Please, repeat blood work in ~6 weeks.   Thanks!  Charline Bills, MD  Assistant Professor of Medicine  Division of Cardiovascular Medicine  [email or web address]    Orders Placed This Encounter   Procedures    Comprehensive Metabolic Panel Green Plasma Separator Tube    Lipid Panel Green Plasma Separator Tube    CPK, Blood Green Plasma Separator Tube

## 2016-11-09 ENCOUNTER — Ambulatory Visit (HOSPITAL_BASED_OUTPATIENT_CLINIC_OR_DEPARTMENT_OTHER): Payer: Commercial Managed Care - HMO | Admitting: Physical Therapy

## 2016-11-09 DIAGNOSIS — G8929 Other chronic pain: Secondary | ICD-10-CM

## 2016-11-09 DIAGNOSIS — M533 Sacrococcygeal disorders, not elsewhere classified: Principal | ICD-10-CM

## 2016-11-09 NOTE — Interdisciplinary (Signed)
Physical Therapy Discharge Summary Note    Referring Physician: Danae Burke           Preferred [place]    Patient to continue therapy for     ICD-10-CM ICD-9-CM    1. Coccydynia M53.3 724.79    2. Coccygeal pain, chronic M53.3 724.79     G89.29 338.29              SUBJECTIVE AND OBJECTIVE       11/09/16 1500       Activity Restrictions    Activity Restrictions None       11/09/16 1500       Patient stated Goal    Patient stated goal to reduce pain and symptoms       11/09/16 1500       Subjective    Status since last treatment  states that he had some minor relief witht he last stretches he was provided.        11/09/16 1500       Pain Assessment    Pain Asssessment Tool Numeric Pain Rating Scale       11/09/16 1500       Numeric Pain Rating Scale     Pain Intensity - rating at present 0     Pain Intensity - rating at worst  3     Frequency  Intermittent     Description  Sharp;Dull;Aching     Location  Coccyx and down into R LE     Pain Description pulsing, throbbing       11/09/16 1500       Pain Characteristics    Exacerbating Factors Other (comments) sitting for long periods of time     Relieving Factors - better with movement                             @UCSPLINTSUBT @  @UCPTWHEELCHAIRSUBT @  @UCPTLYMPHSUBT @  @UCPTGENDEVSUBT @          ASSESSMENT AND PLAN       11/09/16 1500       Assessment     Assessment  "Erik Burke" had mild-mod improvements from his last exercises provided and was told to perform 3 more for his symptoms. His home exercise program was reviewed as well. His prognosis has improved as a result of the positive response to his new mobility stretches. He is being discharged from physical therapy today as he is leaving [place] and changing insurance and states he will not be returning. DC from physical therapy      Rehab Potential Good       11/09/16 1500       Treatment Plan Discussion    Treatment Plan Discussion & Agreement Patient     Patient/Family Questions Yes -  All questions asked & answered       11/09/16 1500       Patient/Family Education    Learner(s) Patient     Patient/family training in appropriate therapeutic interventions Ongoing     Education Topic(s) Exercise Program       11/09/16 1500       Discharge Date    Discharge Date 11/09/16       11/09/16 1500       Outpatient Therapy Overview    Reason for Discharge  Other (comments)   Patient is moving, switching insurance     Status at discharge  Other (comments)   switching insurance  and moving     Response to therapy Good     Symptom progression  Improved     Patient attendance Good, patient has attended at least 75% of scheduled appointments     Patient compliance with therapy program Good     Pain Improved from baseline       11/09/16 1500       Treatment provided today    Payor  Medicare/Private       11/09/16 1500       Therapeutic Procedures    Therapeutic exercise  ([id number])  Patient education;Range of motion exercises;Strengthening exercises        Total TIMED Treatment (min)  60       Therapeutic exercise   reviewed prior HEP, performed manual lumbar distraction; spent 40 minutes discussing prognosis and POC with patient as well as reviewing his HEP        11/09/16 1500       Treatment Time     Total TIMED Treatment  (min) 60     Total Treatment Time (min) 60

## 2016-11-19 ENCOUNTER — Encounter (HOSPITAL_BASED_OUTPATIENT_CLINIC_OR_DEPARTMENT_OTHER): Payer: Self-pay | Admitting: Physical Therapy

## 2016-11-20 ENCOUNTER — Ambulatory Visit (HOSPITAL_BASED_OUTPATIENT_CLINIC_OR_DEPARTMENT_OTHER): Payer: Commercial Managed Care - HMO | Admitting: Pain Medicine

## 2016-11-27 ENCOUNTER — Telehealth (HOSPITAL_COMMUNITY): Payer: Self-pay | Admitting: Cardiovascular Disease

## 2016-11-27 NOTE — Telephone Encounter (Signed)
Patient called wanted to speak with Maudie Mercury would not discuss what this call was pertaining to.  Please call patient.

## 2016-11-28 NOTE — Telephone Encounter (Signed)
Patient called returning Kim's call.

## 2016-11-28 NOTE — Telephone Encounter (Signed)
Returned call to pt.  NA.  Left message

## 2016-11-29 ENCOUNTER — Telehealth (HOSPITAL_COMMUNITY): Payer: Self-pay | Admitting: Cardiovascular Disease

## 2016-11-29 NOTE — Telephone Encounter (Signed)
Spoke with patient.  Pt states he wanted to update me on new insurance and let me know that he has only taken 3 tablets of Crestor due to travel.  Pt will now start Crestor every other day for 6 weeks and then repeat lab work.    Nothing further at this time.

## 2016-11-29 NOTE — Telephone Encounter (Signed)
Patient returning Maudie Mercury call

## 2016-12-13 ENCOUNTER — Encounter (INDEPENDENT_AMBULATORY_CARE_PROVIDER_SITE_OTHER): Payer: Self-pay | Admitting: Internal Medicine

## 2016-12-13 DIAGNOSIS — M533 Sacrococcygeal disorders, not elsewhere classified: Secondary | ICD-10-CM

## 2016-12-13 DIAGNOSIS — M961 Postlaminectomy syndrome, not elsewhere classified: Principal | ICD-10-CM

## 2016-12-14 NOTE — Telephone Encounter (Signed)
Routed to Referral Dept regarding referral questions.

## 2016-12-14 NOTE — Telephone Encounter (Signed)
Patient will need a new order reflecting his new insurance and I can process through managed care.

## 2016-12-14 NOTE — Telephone Encounter (Signed)
From: [name]  To: [name], MD  Sent: 12/13/2016 5:05 PM PDT  Subject: 1-Non Urgent Medical Advice    Hi Dr. Selinda Eon, hope all is well!    My internet browser just crashed when I attempted to send this message, sorry if this is a duplicate.    I recently changed insurance companies from [place] to [facility]. Given this change, do you need to reprocess that out of network referral to [employer]? I'm not sure how insurance companies work in this particular instance, so thought I'd ask.    By the way, I spoke directly with Hilliard Clark, nice guy, and he advised me to see Dr. Hilbert Bible with [facility] due to [facility]'s greater capacity to treat (equipment, background) a broader range of things, so I'll be using this referral to see Britta Mccreedy.    If I don't need a new reprocessed referral, I'll provide [facility]'s office with the original one.    Thanks!  [name]

## 2016-12-15 ENCOUNTER — Other Ambulatory Visit (INDEPENDENT_AMBULATORY_CARE_PROVIDER_SITE_OTHER): Payer: No Typology Code available for payment source | Attending: Cardiovascular Disease

## 2016-12-15 DIAGNOSIS — E7800 Pure hypercholesterolemia, unspecified: Principal | ICD-10-CM | POA: Insufficient documentation

## 2016-12-15 LAB — COMPREHENSIVE METABOLIC PANEL, BLOOD
ALT (SGPT): 24 U/L (ref 0–41)
AST (SGOT): 22 U/L (ref 0–40)
Albumin: 4.8 g/dL (ref 3.5–5.2)
Alkaline Phos: 68 U/L (ref 40–129)
Anion Gap: 13 mmol/L (ref 7–15)
BUN: 12 mg/dL (ref 6–20)
Bicarbonate: 26 mmol/L (ref 22–29)
Bilirubin, Tot: 0.68 mg/dL (ref <1.2)
Calcium: 9.6 mg/dL (ref 8.5–10.6)
Chloride: 102 mmol/L (ref 98–107)
Creatinine: 1.11 mg/dL (ref 0.67–1.17)
GFR: >60 mL/min
Glucose: 93 mg/dL (ref 70–99)
Potassium: 4.3 mmol/L (ref 3.5–5.1)
Sodium: 141 mmol/L (ref 136–145)
Total Protein: 7.3 g/dL (ref 6.0–8.0)

## 2016-12-15 LAB — CPK-CREATINE PHOSPHOKINASE, BLOOD: CPK: 128 U/L (ref 0–175)

## 2016-12-15 NOTE — Interdisciplinary (Unsigned)
Blood drawn from right arm with 22 gauge needle. 1 tubes taken.   Patient identity authenticated by Ashley Aviles.

## 2016-12-16 LAB — LIPID(CHOL FRACT) PANEL, BLOOD
Cholesterol: 176 mg/dL (ref <200)
HDL-Cholesterol: 41 mg/dL
LDL-Chol (Calc): 110 mg/dL (ref <160)
Non-HDL Cholesterol: 135 mg/dL
Triglycerides: 125 mg/dL (ref 10–170)

## 2016-12-18 NOTE — Telephone Encounter (Signed)
Order processed. Pending mc approval.

## 2016-12-19 ENCOUNTER — Telehealth (INDEPENDENT_AMBULATORY_CARE_PROVIDER_SITE_OTHER): Payer: Self-pay | Admitting: Internal Medicine

## 2016-12-19 NOTE — Telephone Encounter (Signed)
Order denied as 2nd opinion authorizations come directly from health plan per managed care.

## 2016-12-19 NOTE — Telephone Encounter (Signed)
PT requesting more clarification in regards to his denial to seen Dr Britta Mccreedy. Please call the pt.

## 2016-12-19 NOTE — Telephone Encounter (Signed)
Patient called in regards to Order for Dr Britta Mccreedy .  Patient is not very clear,attempted to contact nurse but was unsuccessful .  requesting a call back   Please advise

## 2016-12-20 ENCOUNTER — Telehealth (INDEPENDENT_AMBULATORY_CARE_PROVIDER_SITE_OTHER): Payer: Self-pay | Admitting: Internal Medicine

## 2016-12-20 NOTE — Telephone Encounter (Signed)
Spoke to patient regarding referral for second opinion. Informed patient he needs to contact [facility] and get an authorization for the second opinion through them. Patient stated he already has appointment secured and will contact [facility] today to try and obtain auth. Informed patient to call us if any issues arise with this.

## 2016-12-20 NOTE — Telephone Encounter (Signed)
Left a message to call back to discuss referral for second opinion to Dr. Britta Mccreedy.

## 2016-12-20 NOTE — Telephone Encounter (Signed)
Duplicate encounter. Documentation in other encounter. Closing this encounter.

## 2016-12-20 NOTE — Telephone Encounter (Signed)
Veronica calling in from [facility] in regarding encounter from 09/05. Verdene Lennert states that in order for referral [phone] to be approved the PCP will need to call in for a verbal referral for the second opinion.     Please assist    Phone: [phone]

## 2016-12-23 ENCOUNTER — Encounter (INDEPENDENT_AMBULATORY_CARE_PROVIDER_SITE_OTHER): Payer: Self-pay | Admitting: Cardiovascular Disease

## 2016-12-24 ENCOUNTER — Telehealth (HOSPITAL_COMMUNITY): Payer: Self-pay | Admitting: Cardiovascular Disease

## 2016-12-24 NOTE — Telephone Encounter (Signed)
Erik Burke to Sherron Monday, MD        12/23/16 5:36 PM     Hi Dr. Cherylann Ratel, hope all is well.     Was wondering what you thought about the lab results (cholesterol levels) given recent short term statin use, and next steps for the appeal.     Happy to speak with you or Kim via phone.     Thank you!   Erik Burke   [phone]

## 2016-12-24 NOTE — Telephone Encounter (Signed)
Mychart message to Dr. Cherylann Ratel.     Last lipid results:  Lab Results   Component Value Date    CHOL 176 12/15/2016    HDL 41 12/15/2016    LDLCALC 110 12/15/2016    TRIG 125 12/15/2016     Routing to Dr. Griselda Miner advise.

## 2016-12-24 NOTE — Telephone Encounter (Signed)
Addressed in telephone encounter.

## 2016-12-24 NOTE — Telephone Encounter (Signed)
From: [name]  To: Sherron Monday., MD  Sent: 12/23/2016 5:36 PM PDT  Subject: 1-Non Urgent Medical Advice    Hi Dr. Cherylann Ratel, hope all is well.     Was wondering what you thought about the lab results (cholesterol levels) given recent short term statin use, and next steps for the appeal.     Happy to speak with you or Kim via phone.     Thank you!  [name]  [phone]

## 2016-12-25 NOTE — Telephone Encounter (Signed)
Routing to Dr. Selinda Eon for peer to peer review in order to authorize referral for the second opinion.

## 2016-12-25 NOTE — Telephone Encounter (Signed)
mychart    Hi Juanita, I am actually quite happy with this result on such a low dose of rosuvastatin. I do not think we need PCSK9 inhibitor. Are you tolerating it without issues? If you do, go ahead, and increase it to daily use.  Thanks!  Charline Bills, MD  Assistant Professor of Medicine  Division of Cardiovascular Medicine  [email or web address]

## 2016-12-26 ENCOUNTER — Telehealth (HOSPITAL_COMMUNITY): Payer: Self-pay | Admitting: Cardiovascular Disease

## 2016-12-26 ENCOUNTER — Encounter (HOSPITAL_COMMUNITY): Payer: Self-pay | Admitting: Cardiovascular Disease

## 2016-12-26 DIAGNOSIS — E782 Mixed hyperlipidemia: Principal | ICD-10-CM

## 2016-12-26 NOTE — Telephone Encounter (Signed)
From: [name]  To: Sherron Monday., MD  Sent: 12/25/2016 4:42 PM PDT  Subject: labs    Hi Dr. Cherylann Ratel. The numbers look great, I wish I was tolerating with this dose! Same thing happened his time as in [place]-- great numbers but symptoms present. This time I had same symptoms as prior rounds: hamstring pain, as if I have pulled muscles that won't heal. Symptoms eventually went away after stopping medication. Can I try going on a lower and/or less frequent dose and then re-test with another round of labs?    Thanks    ----- Message -----  From: Sherron Monday, MD  Sent: 12/25/16, 2:15 PM  To: Sheral Apley. Poirier  Subject: labs    Hi Sani, I am actually quite happy with this result on such a low dose of rosuvastatin. I do not think we need PCSK9 inhibitor. Are you tolerating it without issues? If you do, go ahead, and increase it to daily use.  Thanks!  Charline Bills, MD  Assistant Professor of Medicine  Division of Cardiovascular Medicine  [email or web address]

## 2016-12-26 NOTE — Telephone Encounter (Signed)
From: [name]  To: Sherron Monday., MD  Sent: 12/25/2016 4:42 PM PDT  Subject: labs    Hi Dr. Cherylann Ratel. The numbers look great, I wish I was tolerating with this dose! Same thing happened his time as in [place]-- great numbers but symptoms present. This time I had same symptoms as prior rounds: hamstring pain, as if I have pulled muscles that won't heal. Symptoms eventually went away after stopping medication. Can I try going on a lower and/or less frequent dose and then re-test with another round of labs?    Thanks    ----- Message -----  From: Sherron Monday, MD  Sent: 12/25/16, 2:15 PM  To: Sheral Apley. [facility]  Subject: labs    Hi Hong, I am actually quite happy with this result on such a low dose of rosuvastatin. I do not think we need PCSK9 inhibitor. Are you tolerating it without issues? If you do, go ahead, and increase it to daily use.  Thanks!  Charline Bills, MD  Assistant Professor of Medicine  Division of Cardiovascular Medicine  [email or web address]              Documentation

## 2016-12-26 NOTE — Telephone Encounter (Signed)
MyChart message to Dr. Cherylann Ratel.      Routing to Dr. Griselda Miner advise.

## 2016-12-26 NOTE — Telephone Encounter (Signed)
Called [facility] for Peer to Peer, spoke with Junie Panning  ID # [id number]    Apparently the number provided was NOT for Peer to Peer, but the provider services for claims  And apparently this could have been done by the member, not just the PCP for information regarding first and second opinion for pain.    Please let pt know this was performed and they can contact the insurance with any issues  Provided code M53.3 - Coccydynia  Ref # [id number]

## 2016-12-26 NOTE — Telephone Encounter (Signed)
Mychart message to Dr. Narezkina

## 2016-12-27 MED ORDER — ROSUVASTATIN CALCIUM 5 MG OR TABS
2.5000 mg | ORAL_TABLET | ORAL | 1 refills | Status: DC
Start: 2016-12-28 — End: 2018-01-17

## 2017-01-01 NOTE — Telephone Encounter (Signed)
Addressed under different encounter.

## 2017-01-22 ENCOUNTER — Encounter (INDEPENDENT_AMBULATORY_CARE_PROVIDER_SITE_OTHER): Payer: Self-pay

## 2017-01-25 ENCOUNTER — Telehealth (HOSPITAL_COMMUNITY): Payer: Self-pay | Admitting: Cardiovascular Disease

## 2017-01-25 NOTE — Telephone Encounter (Signed)
Message  Received: Manley Mason., MD  Benedetto Coons, RN                Perri Aragones, could you please take care of it.   Thanks!         Previous Messages      ----- Message -----    From: Alexia Freestone    Sent: 01/24/2017  5:22 PM     To: Sherron Monday, MD     Hello Dr. Cherylann Ratel,     This patient is enrolled into one of our [place] full financial risk HMO health plans ([place]). All self-injectables (except those for diabetes and EpiPen) are under medical benefit rather than pharmacy benefit now. Therefore, per [place] HMO the authorization approval for these types of medications must go through [facility] managed care as a referral and then must be filled at our preferred pharmacy of [facility]/mail order pharmacy. Please submit a referral for authorization and send a new order to [place] for [place] preferably through the following department: [place]. There should be no change in copay or out of pocket costs in making these changes. Once completed, we will be notifying the patient of the switch in pharmacy given their insurance policy.     Thank you for your time and help in making sure this patient continues therapy without delay.     Best regards,   Full Risk Managed Care Utilization Team

## 2017-01-26 ENCOUNTER — Encounter (INDEPENDENT_AMBULATORY_CARE_PROVIDER_SITE_OTHER): Payer: Self-pay | Admitting: Cardiovascular Disease

## 2017-01-28 ENCOUNTER — Telehealth (HOSPITAL_COMMUNITY): Payer: Self-pay | Admitting: Cardiovascular Disease

## 2017-01-28 NOTE — Telephone Encounter (Signed)
Received MyChart message, see copy below.    [name]  Sherron Monday, MD 2 days ago                   Hi Dr. Cherylann Ratel,     I realized I have a follow up appointment with you on Wednesday. That appointment was made before we changed the meds. I have another month of taking that reduced mg statin, and then will do labs. Is that ok with you if I reschedule that Wed appointment for sometime in November or December, after I do labs?     Thanks,   Erik Burke

## 2017-01-29 NOTE — Telephone Encounter (Signed)
Patient rescheduled his 01-30-17 to December.        Benedetto Coons RN, Clinic Nurse   [facility]

## 2017-01-30 ENCOUNTER — Encounter (INDEPENDENT_AMBULATORY_CARE_PROVIDER_SITE_OTHER): Payer: Commercial Managed Care - HMO | Admitting: Cardiovascular Disease

## 2017-02-05 NOTE — Telephone Encounter (Signed)
Routing to Nguyen, [place] Utilization Team.     Hellen: please advise what type of referral we are to place (regarding patient's Evolocumab injections)   and then we will send the RX to the [place] for processing. Thanks, Makiyla Linch RN       Yan Okray Picardo-Avinash Maltos RN, Clinic Nurse   [facility]

## 2017-02-15 NOTE — Telephone Encounter (Signed)
Called [facility], left message for her to please call back.  Clinic call back number and clinic hours provided.       Benedetto Coons RN, Clinic Nurse   [facility]

## 2017-02-22 NOTE — Telephone Encounter (Signed)
Called [phone], left message with [place]:   patient name, DOB and MRN provided   nurse contact number provided   nurse requesting a call back   clinic number and hours provided        Benedetto Coons RN, Clinic Nurse    [facility]

## 2017-02-28 ENCOUNTER — Encounter (HOSPITAL_BASED_OUTPATIENT_CLINIC_OR_DEPARTMENT_OTHER): Payer: Self-pay | Admitting: Physical Therapy

## 2017-03-18 ENCOUNTER — Telehealth (HOSPITAL_COMMUNITY): Payer: Self-pay | Admitting: Cardiovascular Disease

## 2017-03-18 ENCOUNTER — Encounter (INDEPENDENT_AMBULATORY_CARE_PROVIDER_SITE_OTHER): Payer: Self-pay | Admitting: Cardiovascular Disease

## 2017-03-18 ENCOUNTER — Other Ambulatory Visit
Admission: RE | Admit: 2017-03-18 | Discharge: 2017-03-19 | Disposition: A | Discharge status: Home / Self Care | Payer: No Typology Code available for payment source | Attending: Cardiovascular Disease | Admitting: Cardiovascular Disease

## 2017-03-18 DIAGNOSIS — E782 Mixed hyperlipidemia: Principal | ICD-10-CM | POA: Insufficient documentation

## 2017-03-18 LAB — LIPID(CHOL FRACT) PANEL, BLOOD
Cholesterol: 233 mg/dL (ref <200)
HDL-Cholesterol: 50 mg/dL
LDL-Chol (Calc): 145 mg/dL (ref <160)
Non-HDL Cholesterol: 183 mg/dL
Triglycerides: 190 mg/dL — ABNORMAL HIGH (ref 10–170)

## 2017-03-18 MED ORDER — EVOLOCUMAB 140 MG/ML SC SOSY
140.0000 mg | PREFILLED_SYRINGE | SUBCUTANEOUS | 3 refills | Status: DC
Start: 2017-03-18 — End: 2017-04-12

## 2017-03-18 NOTE — Progress Notes (Signed)
Hi [name],   Here are the results of cholesterol panel. Were you taking  2.5 mg of rosuvastatin every other day?  I will try to resubmit request for injectable PCSK9 inhibitor.    Thank you!  Charline Bills, MD  Assistant Professor of Medicine  Division of Cardiovascular Medicine

## 2017-03-18 NOTE — Telephone Encounter (Signed)
the patient only able to tolerate 2.5 of rosuvastatin QOD, unable to tolerate 5 mg QOD due to muscle aches. Inadequate control of LDL with 2.5 mg of rosuvastatin QOD in a young patient with very high Bro[place]us score. At high risk for cardiovascular events. Will attempt to resubmit prior authorization for PCSK9 inhibitor.    Additionally, in the past the patient already tried Lipitor, pravastatin, Zetia, and all caused debilitating myalgias. Recommend transitioning to [place], as the patient has evidence of ASCVD - coronary calcification (49 percentile for age and sex), see below. Could start with 140 q2 weeks.  Continue dietary modifications.      Coronary [place] report:    ADDENDUM REPORT: 12/31/2013 12:36  CLINICAL DATA: Risk stratification  EXAM:  Coronary Calcium Score  TECHNIQUE:  The patient was scanned on a Siemens Sensation 16 slice scanner.  Axial non-contrast 3 mm slices were carried out through the heart.  The data set was analyzed on a dedicated work station and scored  using the [place].  FINDINGS:  Non-cardiac: See separate report from [facility].  Ascending Aorta: Normal size.  Pericardium: Normal  Coronary arteries: Originating in normal position.  IMPRESSION:  Coronary calcium score of 6. This was 51 percentile for age and sex  matched control.    In the past denied (see phone encounter from 10/19/16)

## 2017-03-19 NOTE — Telephone Encounter (Signed)
From: [name]  To: Sherron Monday., MD  Sent: 03/18/2017 11:36 PM PST  Subject: 1-Non Urgent Medical Advice    I have a question about LIPID(CHOL FRACT) PANEL, BLOOD resulted on 03/18/17, 1:22 PM.    Hi Dr. Cherylann Ratel,    In response to your question, yes, I was taking 2.5mg  of rosuvastatin 3x/week :/ I'm very much open to trying the pcsk9 inhibitors, given I can get insurance approval. I have a new insurance, so it's worth a try!    I've attempted a number of restrictive eating habits the last few years (under the guidance of cardiologists) and it seemed to have almost no impact on lipid panel results (which is why they thought familial hypercholesterolemia); however, I'm open to trying more restrictive eating habits + 2.5mg  rosuvastatin while we are waiting on pcsk9 inhibitors.     I have a friend who is a cardiologist nurse who sent me this email that I wanted to ask you about: "I just spoke to a drug rep who told me there is at least a 20% false negative rate on genetic testing for familial hyperlipidemia. Also the cost of PCSK9s has dropped a lot. Also, there is another drug named Juxtapid. Hope all is well!"    I have an appointment with you on Wed morning. Should I keep the appointment and we can discuss these points? Happy to exchange emails also, whichever you think is best :)    Best,  Erik Burke

## 2017-03-19 NOTE — Telephone Encounter (Signed)
See separate telephone encounter for follow up.  MyChart encounter will be closed.       Somnang Mahan Picardo-Ovetta Bazzano RN, Clinic Nurse   [facility]   #[phone]

## 2017-03-19 NOTE — Telephone Encounter (Signed)
Received MyChart message, see below.    Anne Fu, MD 9 hours ago (11:36 PM)                   I have a question about LIPID(CHOL FRACT) PANEL, BLOOD resulted on 03/18/17, 1:22 PM.     Hi Dr. Cherylann Ratel,     In response to your question, yes, I was taking 2.5mg  of rosuvastatin 3x/week :/ I'm very much open to trying the pcsk9 inhibitors, given I can get insurance approval. I have a new insurance, so it's worth a try!     I've attempted a number of restrictive eating habits the last few years (under the guidance of cardiologists) and it seemed to have almost no impact on lipid panel results (which is why they thought familial hypercholesterolemia); however, I'm open to trying more restrictive eating habits + 2.5mg  rosuvastatin while we are waiting on pcsk9 inhibitors.     I have a friend who is a cardiologist nurse who sent me this email that I wanted to ask you about: "I just spoke to a drug rep who told me there is at least a 20% false negative rate on genetic testing for familial hyperlipidemia. Also the cost of PCSK9s has dropped a lot. Also, there is another drug named Juxtapid. Hope all is well!"     I have an appointment with you on Wed morning. Should I keep the appointment and we can discuss these points? Happy to exchange emails also, whichever you think is best :)     Best,   Erik Burke

## 2017-03-20 ENCOUNTER — Encounter (INDEPENDENT_AMBULATORY_CARE_PROVIDER_SITE_OTHER): Payer: Self-pay | Admitting: Cardiovascular Disease

## 2017-03-20 ENCOUNTER — Ambulatory Visit (INDEPENDENT_AMBULATORY_CARE_PROVIDER_SITE_OTHER): Payer: No Typology Code available for payment source | Admitting: Cardiovascular Disease

## 2017-03-20 VITALS — BP 131/83 | HR 76 | Temp 97.7°F | Resp 16 | Ht 68.0 in | Wt 175.2 lb

## 2017-03-20 DIAGNOSIS — E7800 Pure hypercholesterolemia, unspecified: Principal | ICD-10-CM

## 2017-03-20 DIAGNOSIS — Z789 Other specified health status: Secondary | ICD-10-CM

## 2017-03-20 NOTE — Progress Notes (Signed)
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    He tried Lipitor, pravastatin, Zetia, and Crestor (unsure of Crestor dose).  Muscle weakness and pain with all of those medications, including Zetia alone was to the degree that he could not put clothes on, had to miss work. Tried to get approval for PCSK9 inhibitors previously, although unsuccessful.  He was tested negative for FH mutations.   Mentions that heart rate tends to run high, often in 90's. Had skipped beats couple years ago, none recently.  Below is the detailed summary of his trials of different statins, and prior lipid panel results:      Interval history:  Has a 67-monthold baby, and the schedule was affected, just going back to exercise. Could not tolerate 5 mg QOD due to muscle aches, and "pulling sensation over posterior thighs". Tolerates 2.5 mg of rosuvastatin QOD. No chest pain, no shortness of breath.     Diagnostics (I reviewed all the test results below today):  EKG 09/26/16 (interpreted by me):  NSR, HR84    Coronary [place] report:    ADDENDUM REPORT: 12/31/2013 12:36  CLINICAL DATA: Risk stratification  EXAM:  Coronary Calcium Score  TECHNIQUE:  The patient was scanned on a Siemens Sensation 16 slice scanner.  Axial non-contrast 3 mm slices were carried out through the heart.  The data set was analyzed on a dedicated work station and scored  using the [place]  FINDINGS:  [facility]cardiac[facility]  Ascending Aorta: Normal size.  Pericardium: Normal  Coronary arteries: Originating in normal position.  IMPRESSION:  Coronary calcium score of 6. This was 870percentile for age and sex  matched control.    ROS:  Constitutional: negative.    Eyes:  negative.    Ears, Nose, Mouth, Throat: negative.    CV: as in HPI.  Resp: negative.    GI: negative.    GU: negative.    Musculoskeletal:negative.  Integumentary: negative.    Neuro: negative.    Psych: negative  Endo: negative.    Heme/Lymphatic: negative.    Allergy/Immun: negative.     Past Medical / Surgical History:  Past Medical History:   Diagnosis Date    Acne     Back pain     secondary to herniated disc, from back injury in 2000    Hyperhidrosis     propanolol tx    Hyperlipidemia     LDL > 200, TG ~400 at Dx, statin intolerant.  Per pt, r/o for familial HDL    Parasomnia associated with sexual behavior     Tx with KCaprice Renshaw has been being downtitrated     Social History:  Social History     Social History    Marital status: Married     Spouse name: N/A    Number of children: N/A    Years of education: N/A     Occupational History    Not on file.     Social History Main Topics    Smoking status: Never Smoker    Smokeless tobacco: Never Used    Alcohol use Yes      Comment: 1-3 drinks infrequently  Drug use: Not on file    Sexual activity: Not on file     Other Topics Concern    Not on file     Social History Narrative    PhD in Psychology, works in [employer]     Family History:  Family History   Problem Relation Age of Onset    Cancer Mother      cervical    No Known Problems Father     Cancer Maternal Grandmother      ovarian    No Known Cancer Maternal Grandfather     Heart Disease Maternal Grandfather     Cancer Paternal Grandmother      pancreatic    Cancer Paternal Grandfather      mesothelioma    Cancer Brother 52     osteosarcoma   Maternal grandfather - HLD, ?heart disease diagnosed in his 40's, family does not know details  Mother - HLD  Father - HLD on statin    Medications (medication list reviewed by me with the patient):  Current Outpatient Prescriptions   Medication Sig    acetaZOLAMIDE (DIAMOX) 250 MG tablet Take 1 tablet (250 mg) by mouth daily as  needed (headache).    clonazePAM (KLONOPIN) 0.5 MG tablet Take 0.5 tablets (0.25 mg) by mouth at bedtime.    Evolocumab 140 MG/ML SOSY Inject 140 mg under the skin every 14 days.    propranolol (INDERAL LA) 60 MG CP24 Take 1 capsule (60 mg) by mouth daily. (Patient taking differently: Take 60 mg by mouth daily as needed.  )    rosuvastatin (CRESTOR) 5 MG tablet Take 0.5 tablets (2.5 mg) by mouth three times a week.    tretinoin (RETIN-A) 0.025 % cream Use a pea sized amount on the entire face nightly.  At first, you may use every other night to avoid dryness and irritation, and gradually increase use to nightly as tolerated     No current facility-administered medications for this visit.    propranolol for hyperhydrosis    Allergies:  Patient is allergic to atorvastatin; pravastatin; rosuvastatin; and zetia [ezetimibe].    Physical Exam:  BP 131/83 (BP Location: Left arm, BP Patient Position: Sitting, BP cuff size: Regular)   Pulse 76   Temp 97.7 F (36.5 C) (Oral)   Resp 16   Ht '5\' 8"'$  (1.727 m)   Wt 79.5 kg (175 lb 3.2 oz)   SpO2 100%   BMI 26.64 kg/m2 /    General: awake, alert, no acute distress  HEENT: NCAT, PERRL, EOMI  Neck: JVP ~7cm, no carotid bruits  Lungs: clear to auscultation bilaterally, no wheezing/crackles  Heart: RRR, nl S1S2, no murmurs, rubs or gallops  Abdomen: +BS, soft, non-tender, non-distended  Extremities: warm, well-perfused, no edema,  2+ symmetric distal pulses    Lab Data (reviewed by me today):  Lab Results   Component Value Date    BUN 12 12/15/2016    CREAT 1.11 12/15/2016    CL 102 12/15/2016    NA 141 12/15/2016    K 4.3 12/15/2016    Rives 9.6 12/15/2016    TBILI 0.68 12/15/2016    ALB 4.8 12/15/2016    TP 7.3 12/15/2016    AST 22 12/15/2016    ALK 68 12/15/2016    BICARB 26 12/15/2016    ALT 24 12/15/2016    GLU 93 12/15/2016     Lab Results   Component Value Date    WBC  4.4 02/11/2016    RBC 5.04 02/11/2016    HGB 15.5 02/11/2016    HCT 44.9 02/11/2016    MCV 89.1 02/11/2016     MCHC 34.5 02/11/2016    RDW 11.5 (L) 02/11/2016    PLT 245 02/11/2016    MPV 9.3 (L) 02/11/2016     Lab Results   Component Value Date    CHOL 233 03/18/2017    HDL 50 03/18/2017    LDLCALC 145 03/18/2017    TRIG 190 (H) 03/18/2017     Lab Results   Component Value Date    TSH 1.16 02/11/2016       Assessment/Plan:  *hyperlipidemia:   *statin intolerance:  -attempted statin re-challenge, and the patient was only able to tolerate 2.5 of rosuvastatin QOD, unable to tolerate 5 mg QOD due to muscle aches. Inadequate control of LDL with 2.5 mg of rosuvastatin QOD in a young patient with very high [place] score. At high risk for cardiovascular events. Will attempt to resubmit prior authorization for PCSK9 inhibitor.    Additionally, in the past the patient already tried Lipitor, pravastatin, Zetia,and all caused debilitating myalgias. Recommendtransitioning to [place], as the patient has evidence of ASCVD - coronary calcification (58 percentile for age and sex), see below. Will start with 140 q2 weeks.  Continue dietary modifications.    No orders of the defined types were placed in this encounter.    There are no Patient Instructions on file for this visit.    Return in about 1 year (around 03/20/2018).      Charline Bills, MD    Assistant Clinical Professor of Medicine  Division of [place]  Pager [phone]  [email or web address] .Bennie Pierini

## 2017-03-21 NOTE — Telephone Encounter (Signed)
Message  Received: Manley Mason., MD  Benedetto Coons, RN                Lelon Frohlich, when you submit PA for repatha could use my update note from today, 03/20/17. Thanks!

## 2017-03-25 ENCOUNTER — Telehealth (HOSPITAL_COMMUNITY): Payer: Self-pay | Admitting: Cardiovascular Disease

## 2017-03-25 DIAGNOSIS — E782 Mixed hyperlipidemia: Principal | ICD-10-CM

## 2017-03-25 NOTE — Telephone Encounter (Signed)
Prior auth sent for:  Evolocumab  Verification received.

## 2017-03-27 NOTE — Telephone Encounter (Signed)
Patient called returning [place] call. Please call patient.

## 2017-03-27 NOTE — Telephone Encounter (Signed)
Hazel with [employer] called to check status on Repatha prior Auth  is returning [place] call.  Please call her a [phone].

## 2017-03-27 NOTE — Telephone Encounter (Signed)
See other encounter regarding medication PA.      [facility] Nurse  [place]  [facility] and [facility]

## 2017-03-29 ENCOUNTER — Encounter (HOSPITAL_COMMUNITY): Payer: Self-pay

## 2017-03-29 ENCOUNTER — Encounter (INDEPENDENT_AMBULATORY_CARE_PROVIDER_SITE_OTHER): Payer: Self-pay | Admitting: Internal Medicine

## 2017-03-29 NOTE — Telephone Encounter (Signed)
Sent prior authorization to [employer]. [employer] does not process injectable medications. Routed to "medical group." are we able to route medication to discharge pharmacy so they can assist with the prior authorization?

## 2017-03-29 NOTE — Telephone Encounter (Signed)
Routed to MD to [employer].     Pended Referral if agreeable to sign.

## 2017-03-29 NOTE — Telephone Encounter (Signed)
From: [name]  To: [name], MD  Sent: 03/29/2017 12:28 AM PST  Subject: 2-Procedural Question    Hi Dr. Selinda Eon,    [facility] all is well. I saw Hilbert Bible at [facility]. He's in the process of writing a note that I will provide to you.     He did a great job at listening and asking a series of questions. He is considering Tarlov Cysts as a rule-out. These are actually consistent with the sxs I've been experiencing. Within the last two months the symptoms have changed and worsened: continual sacral pain especially with sitting for longer than an hour, and what is newer is pressure/pain behind the eyes, forehead, and in the back of the neck that coincides with the sacral pain (also nausea, and I see a coupe rings of light when I close my eyes so it seems the optic nerve may be affected). This happens about twice a day. If I can walk vigorously for about 10 minutes the pressure/pain will reduce. If not, it will linger. Also if I can be pretty physically active (such as on vacation) throughout the day I tend to not experience a "flare up" during the day. 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 When it does  "flare up" in the sacrum and forehead it's pretty painful.     I think MRI and/or myelogram might be next best steps wrt diagnostics. The myelograms I had at [facility] we're incredibly useful in providing a more clear picture of the CSF leaks and I believe cysts that we're identified by the neuroradiologist.    Regardless of the specific rule-out, Dr. Britta Mccreedy was going to recommend referral to neurology. Wanted to let you know now because I know scheduling can sometimes take a while. I will provide you with his note as soon as he sends it to me. I'm open to neuro referral/scheduling in the meantime, whatever you think is best!    Thank you,  [name]

## 2017-03-29 NOTE — Telephone Encounter (Signed)
Routing to [employer] who is presently handling this PA.      [facility] Nurse  [place]  Cardiology Clinic and Heart Failure Clinic

## 2017-04-01 NOTE — Telephone Encounter (Addendum)
Erik Burke from cover my meds phone [phone] ref# [id number] is calling in reference to repatha , warm transfer to [facility] attempted .  She will callback cover my meds

## 2017-04-01 NOTE — Telephone Encounter (Signed)
Routed to MD to Review MyChart message.       Pended Referral if agreeable.

## 2017-04-03 NOTE — Telephone Encounter (Signed)
Insurance informed me that prior Josem Kaufmann is to be submitted and processed through [facility] managed care. Faxed this morning to [phone]. Phone number provided is [phone]. Verification received. Informed patient of delay.

## 2017-04-11 ENCOUNTER — Encounter (INDEPENDENT_AMBULATORY_CARE_PROVIDER_SITE_OTHER): Payer: Self-pay

## 2017-04-11 ENCOUNTER — Ambulatory Visit (INDEPENDENT_AMBULATORY_CARE_PROVIDER_SITE_OTHER): Payer: No Typology Code available for payment source | Admitting: Occupational Health

## 2017-04-11 VITALS — BP 127/80 | HR 107 | Temp 98.3°F | Ht 68.0 in | Wt 175.0 lb

## 2017-04-11 DIAGNOSIS — R0981 Nasal congestion: Principal | ICD-10-CM

## 2017-04-11 DIAGNOSIS — J029 Acute pharyngitis, unspecified: Secondary | ICD-10-CM

## 2017-04-11 LAB — STREP TEST POCT: Rapid Group A Strep Screen: NEGATIVE

## 2017-04-11 MED ORDER — PSEUDOEPHEDRINE HCL 120 MG OR TB12
120.0000 mg | ORAL_TABLET | Freq: Two times a day (BID) | ORAL | 0 refills | Status: DC | PRN
Start: 2017-04-11 — End: 2018-03-26

## 2017-04-11 MED ORDER — GUAIFENESIN 600 MG OR TB12
1200.0000 mg | ORAL_TABLET | Freq: Two times a day (BID) | ORAL | 0 refills | Status: DC
Start: 2017-04-11 — End: 2018-01-17

## 2017-04-11 MED ORDER — OXYMETAZOLINE HCL 0.05 % NA SOLN
2.0000 | Freq: Two times a day (BID) | NASAL | 0 refills | Status: DC
Start: 2017-04-11 — End: 2018-03-26

## 2017-04-11 NOTE — Progress Notes (Signed)
Erik Burke  MRN: [id number]  DOB: Sep 29, 1980  PMD: [name]    CC: New Patient (sore throat 3 days )    Chief Complaint   Patient presents with    New Patient     sore throat 3 days        HPI: Erik Burke is a 36 year old male  has a past medical history of Acne, Back pain, Hyperhidrosis, Hyperlipidemia, and Parasomnia associated with sexual behavior. who presents with c/o sore throat pain x 3 days and sinus pressure x 2 days. He Denies any F/N/V. Sinus pain is below both eyes. He is not taking any OTC cold medication or nasal decongestant. No difficulty eating or drinking, no sob, wheezing, difficulty breathing.No rash. He states his wife as been sick with a cold. He denies any recent travel. He, his wife, 72 month old child and his mom are leaving for [place] tomorrow to take a cruise. They will be gone for 10 days.     PMHx: see hpi  PSHx:  has a past surgical history that includes epidural steroid injection and l3/4 laminectomy (2014).  Shx:  reports that  has never smoked. he has never used smokeless tobacco. He reports that he drinks alcohol.  FamHx: family history includes Cancer in his maternal grandmother, mother, paternal grandfather, and paternal grandmother; Cancer (age of onset: 34) in his brother; Heart Disease in his maternal grandfather; No Known Cancer in his maternal grandfather; No Known Problems in his father.  Meds: has a current medication list which includes the following prescription(s): acetazolamide, clonazepam, evolocumab, guaifenesin, oxymetazoline, propranolol, pseudoephedrine, rosuvastatin, and tretinoin.  ALL: Atorvastatin; Pravastatin; Rosuvastatin; and Zetia [ezetimibe]    Review of Systems   Constitutional: Negative for activity change, appetite change, chills, diaphoresis, fatigue and fever.   HENT: Positive for congestion, rhinorrhea, sinus pressure, sinus pain and sore throat. Negative for dental problem, ear discharge, ear pain, nosebleeds, postnasal drip,  sneezing, tinnitus, trouble swallowing and voice change.    Eyes: Negative for pain, discharge, redness and itching.   Respiratory: Negative for apnea, cough, choking, chest tightness, shortness of breath, wheezing and stridor.    Cardiovascular: Negative for chest pain, palpitations and leg swelling.   Neurological: Negative for dizziness, syncope, weakness and light-headedness.       EXAM  BP 127/80 (BP Location: Left arm, BP Patient Position: Sitting, BP cuff size: Regular)    Pulse 107    Temp 98.3 F (36.8 C) (Oral)    Ht 5\' 8"  (1.727 m)    Wt 79.4 kg (175 lb)    SpO2 97%    BMI 26.61 kg/m     Vitals noted     GEN nad a&o nontoxic well appearing  HEENT NC/AT  EYE: perrla, eomi, anicteric  NOSE: MMM, turbinates wnl, frontal sinuses TTP  EARS: TMs and EACs clear bilaterally, no drainage  OP w/ mmm, mild erythema, no exudate, petechia, or lesions noted. No tonsillar abscesses. Uvula midline  NECK nl rom no jvd. No anterior or posterior cervical lymphadenopathy  CV rrr no murms, rubs or clicks  LUNGS ctab, no wheezing or crackles  EXT no edema or cyanosis  SKIN no acute appearing rash or lesions  NEURO mae nl mentation, memory, speech and gait    Studies:     Results for orders placed or performed in visit on 04/11/17   Strep Test (POCT)   Result Value Ref Range    Rapid Group A Strep Screen  neg Neg    Strep Test, Control pass        No results found.      A/P:   Well appearing patient with c/o sore throat pain x 3 days and sinus pressure x 2 days. He Denies any F/N/V. Sinus pain is below both eyes. He is not taking any OTC cold medication or nasal decongestant.    Bacterial v. Viral pharyngitis/sunusitis    Rapid strep negative    Patient's frontal sinuses TTP, nasal membranes pink, boggy and with clear drainage. Patient's posterior pharynx with mild erythema, no exudate, petechiae, lesions, tonsillar abscess.  Uvula midline. Patient has no anterior or posterior cervical lymphadenopathy.  Breath sounds are  clear, no wheezing, rhonchi, crackles.     Patient's symptoms of 3 days consistent with viral URI/pharyngitis and recommending supportive care:  Ibuprofen p.r.n. sore throat.  Do not use for more than 7 days.  Sudafed 120 mg controlled release tablet every 12 hours p.r.n. nasal congestion. May alternate with Afrin nasal spray 2 sprays to each nares q.12 hours p.r.n. nasal congestion. Do not use for more than 3 days.  Mucinex 1200 mg p.o. q.12 hours p.r.n. Congestion.     Patient instructed to follow up with PCP within 1 week, as needed. Strict ER precautions reviewed. Return precautions discussed. Patient understands and agrees with the plan of care.      ICD-10-CM ICD-9-CM    1. Nasal congestion R09.81 478.19 pseudoePHEDrine (SUDAFED 12 HOUR) 120 MG Controlled-Release tablet      guaiFENesin (MUCINEX) 600 MG Sustained-Release tablet   2. Pharyngitis, unspecified etiology J02.9 462 oxymetazoline (AFRIN) 0.05 % nasal spray      Strep Test (POCT)

## 2017-04-11 NOTE — Patient Instructions (Addendum)
Pharyngitis, Viral    You have been diagnosed with viral pharyngitis. This is the medical term for a sore throat.    Viral pharyngitis is an infection in the back of your throat and tonsils caused by a virus. It may look and feel like a Strep throat, but it usually occurs with other cold symptoms like a runny nose, sinus congestion, cough, fever (temperature higher than 100.69F / 38C), or muscle aches. It is sometimes hard to tell a viral sore throat from Strep throat. Your doctor may do a "rapid strep test" or culture to see which kind of infection you have.    Symptoms include fever, sore throat, painful swallowing, and headache. You may also have symptoms of the common cold and notice swollen tender neck glands. You might have pus or white spots on your tonsils.    Viral pharyngitis is treated with medication for pain and fever. Antibiotics DO NOT cure viral infections and may cause side-effects, like diarrhea, nausea, abdominal cramps or allergic reactions. Using antibiotics when they are not necessary can lead to resistance, meaning antibiotics will not work when you need them in the future.    YOU SHOULD SEEK MEDICAL ATTENTION IMMEDIATELY, EITHER HERE OR AT THE NEAREST EMERGENCY DEPARTMENT, IF ANY OF THE FOLLOWING OCCURS:   You have trouble breathing.   Your voice changes or sounds hoarse.   Your throat pain gets worse or you have neck pain.   You cannot swallow liquids or medication.   You drool or cannot swallow saliva.   You feel worse or dont improve after 2 to 3 days.       Sinusitis    You have been seen for a sinus infection.    Sinus infections are common. They often happen after people have a virus or a common cold.    Symptoms are: Pain in the face, green or yellow mucous from the nose, fever (temperature higher than 100.69F / 38C) and chills. There may also be a feeling of fullness or pressure in the face.    Decongestants may be used to help the sinuses drain. Sometimes a steroid  spray is used.    You may need antibiotics for the infection. Not all cases of sinusitis need antibiotics. Viruses cause most sinusitis. Antibiotics do not work on viruses. Sometimes sinusitis and be caused by bacteria. These cases usually get better with medicine to help the sinuses drain. Antibiotics should be given only to patients who have symptoms for more than 2 weeks and if they have fever, severe sinus pain and pus mixed in with the mucus.    YOU SHOULD SEEK MEDICAL ATTENTION IMMEDIATELY, EITHER HERE OR AT THE NEAREST EMERGENCY DEPARTMENT, IF ANY OF THE FOLLOWING OCCURS:   You do not get better with treatment.   You have severe headaches and high fever (temperature higher than 100.69F / 38C) or any confusion.   You have worse pain in the face. Your face gets more swollen.         If you have any follow up questions regarding your Express Care Visit today including symptoms, medication concerns please contact your primary care physician's office who will follow up with you.

## 2017-04-12 ENCOUNTER — Other Ambulatory Visit (HOSPITAL_COMMUNITY): Payer: Self-pay | Admitting: Adult Health

## 2017-04-12 ENCOUNTER — Telehealth (HOSPITAL_COMMUNITY): Payer: Self-pay | Admitting: Cardiovascular Disease

## 2017-04-12 DIAGNOSIS — E782 Mixed hyperlipidemia: Principal | ICD-10-CM

## 2017-04-12 DIAGNOSIS — Z789 Other specified health status: Secondary | ICD-10-CM

## 2017-04-12 MED ORDER — EVOLOCUMAB 140 MG/ML SC SOAJ
SUBCUTANEOUS | 3 refills | Status: DC
Start: 2017-04-12 — End: 2018-01-17

## 2017-04-12 MED ORDER — EVOLOCUMAB 140 MG/ML SC SOSY
140.0000 mg | PREFILLED_SYRINGE | SUBCUTANEOUS | 3 refills | Status: DC
Start: 2017-04-12 — End: 2017-04-24

## 2017-04-12 NOTE — Telephone Encounter (Signed)
Attempted calling [facility] managed many times receiving voicemail. Insurance still saying medication is handled by [facility] managed care. See telephone encounters.

## 2017-04-12 NOTE — Telephone Encounter (Signed)
Sent to [employer].  Pls notify patient to follow-up and give phone no.

## 2017-04-17 ENCOUNTER — Encounter (HOSPITAL_COMMUNITY): Payer: Self-pay

## 2017-04-19 ENCOUNTER — Encounter (HOSPITAL_COMMUNITY): Payer: Self-pay

## 2017-04-19 NOTE — Telephone Encounter (Signed)
PA was denied.   PA case number is [id number].   Scanned into media.

## 2017-04-19 NOTE — Telephone Encounter (Signed)
Did they give any reason for refusal, forwarded any paperwork? I would like to try an appeal.

## 2017-04-19 NOTE — Telephone Encounter (Signed)
MD may request an appeal be forwarded to the patient's insurance.    Routing to Dr. Cherylann Ratel as Juluis Rainier and instructions.       Benedetto Coons RN, Clinic Nurse   [facility]

## 2017-04-22 NOTE — Telephone Encounter (Signed)
Repatha is denied for medical necessity. Medication authorization requires the following:  (1) Submission of medical records (e.g., chart notes, laboratory values) documenting diagnosis of  homozygous familial hypercholesterolemia as confirmed by one of the following:  (A) Genetic confirmation of 2 mutations in the LDL receptor, ApoB, PCSK9, or LDL receptor adaptor  protein 1 (i.e., LDLRAP1 or ARH); OR  (B) both of the following:  (i) One of the following:  (a) Untreated/pre-treatment LDL-C greater than 500 mg/dL; OR  (b) treated LDL-C greater than 300 mg/dL; AND  (ii) one of the following:  (a) Xanthoma before 35 years of age; OR  (b) evidence of heterozygous familial hypercholesterolemia (HeFH) in both parents; AND  (2) you are receiving other lipid-lowering therapy (e.g., statin, ezetimibe, LDL apheresis) ; AND  (3) the prescriber attests to the following: the information provided is true and accurate to the best of their  [id number] [employer]  knowledge and they understand that [employer] may perform a routine audit and request the medical  information necessary to verify the accuracy of the information provided.  Reviewed by: Titus Mould.D.

## 2017-04-22 NOTE — Telephone Encounter (Signed)
It needs to be sent to [facility] care through epic. Previously faxed them the request. Sent to discharge pharmacy for assistance. Discharge pharmacy requesting PA sent to [facility] managed care.

## 2017-04-24 NOTE — Telephone Encounter (Signed)
mychart    Dear Rodman Key, insurance denied Repatha again. You have mentioned that genetic testing was negative in the past, do you mind sending me that info, so we will make sure we are not missing anything.  Thanks!  Charline Bills, MD  Assistant Professor of Medicine  Division of Cardiovascular Medicine  [email or web address]    Might consider trying to submit prior authorization for Praluent instead

## 2017-04-25 ENCOUNTER — Encounter (HOSPITAL_COMMUNITY): Payer: Self-pay | Admitting: Cardiovascular Disease

## 2017-04-25 ENCOUNTER — Other Ambulatory Visit: Payer: Self-pay

## 2017-04-25 ENCOUNTER — Other Ambulatory Visit (INDEPENDENT_AMBULATORY_CARE_PROVIDER_SITE_OTHER): Payer: Self-pay | Admitting: Internal Medicine

## 2017-04-25 DIAGNOSIS — R61 Generalized hyperhidrosis: Secondary | ICD-10-CM

## 2017-04-25 MED ORDER — PROPRANOLOL HCL CR 60 MG OR CP24
60.0000 mg | ORAL_CAPSULE | Freq: Every day | ORAL | 3 refills | Status: DC
Start: 2017-04-25 — End: 2017-04-26

## 2017-04-25 NOTE — Telephone Encounter (Signed)
Medication requested:   Requested Prescriptions     Pending Prescriptions Disp Refills    propranolol (INDERAL LA) 60 MG CP24 90 each 3     Sig: Take 1 capsule (60 mg) by mouth daily.       Date of last refill: 04.06.18    Last Office Visit w/ PCP: 07.09.18  Last Office Visit w/ Dept: n/a    Next Office Visit w/ PCP: 01.11.19  Next Office Visit w/ Dept: n/a

## 2017-04-26 ENCOUNTER — Encounter (INDEPENDENT_AMBULATORY_CARE_PROVIDER_SITE_OTHER): Payer: Self-pay

## 2017-04-26 ENCOUNTER — Telehealth (HOSPITAL_COMMUNITY): Payer: Self-pay | Admitting: Cardiovascular Disease

## 2017-04-26 ENCOUNTER — Ambulatory Visit (INDEPENDENT_AMBULATORY_CARE_PROVIDER_SITE_OTHER): Payer: No Typology Code available for payment source | Admitting: Internal Medicine

## 2017-04-26 ENCOUNTER — Encounter (INDEPENDENT_AMBULATORY_CARE_PROVIDER_SITE_OTHER): Payer: Self-pay | Admitting: Internal Medicine

## 2017-04-26 VITALS — BP 128/72 | HR 74 | Temp 97.8°F | Resp 14 | Ht 68.0 in | Wt 175.0 lb

## 2017-04-26 DIAGNOSIS — Z1331 Encounter for screening for depression: Secondary | ICD-10-CM

## 2017-04-26 DIAGNOSIS — G9611 Dural tear: Secondary | ICD-10-CM

## 2017-04-26 DIAGNOSIS — G4750 Parasomnia, unspecified: Secondary | ICD-10-CM

## 2017-04-26 DIAGNOSIS — R61 Generalized hyperhidrosis: Secondary | ICD-10-CM

## 2017-04-26 DIAGNOSIS — E782 Mixed hyperlipidemia: Secondary | ICD-10-CM

## 2017-04-26 MED ORDER — PROPRANOLOL HCL CR 60 MG OR CP24
60.0000 mg | ORAL_CAPSULE | Freq: Every day | ORAL | 3 refills | Status: DC
Start: 2017-04-26 — End: 2018-06-25

## 2017-04-26 NOTE — Telephone Encounter (Signed)
-----   Message from Ronnette Hila, [place] sent at 04/19/2017 11:44 AM PST -----  Regarding: FW: Repatha 140mg /ml PA  referral to [facility] for approval through epic.       ----- Message -----  From: Emmie Niemann  Sent: 04/19/2017  10:44 AM  To: Ronnette Hila, LVN, #  Subject: Repatha 140mg /ml PA                              Hello,       [facility] received the prescription for Repatha 140mg /ml. However, we will not be able to handle the billing. The pt's pharmacy benefit plan is stating that it needs to be handled through medical benefits ([place]). You will need to submit a referral to [facility] for approval. If approved, they will assign the pharmacy where it will need to be dispensed.     Thank you, [facility] @ [place]

## 2017-04-26 NOTE — Patient Instructions (Addendum)
See Dr. Latricia Heft -    Continue all medicines  See me in 9 months for an annual or sooner as needed

## 2017-04-26 NOTE — Telephone Encounter (Signed)
Referral to managed care pharmacist placed.

## 2017-04-26 NOTE — Progress Notes (Signed)
Primary Care FOLLOW UP VISIT    Routine Follow Up Appointment: PRIMARY CARE    Erik Burke is a 37 year old year old male with a who comes in for routine follow up  The patient has presents for Referral/authorization  37 YO man with a history of HLD, parasomnia, hyperhidrosis, and chronic coccyx/sacral pain w/ radiation presents to followup - given progressive coccyx pain, seen by pain management in [facility] (Dr. Britta Mccreedy). Suggested seeing ?neuroradiologist which was not clear so he come sin to discuss options.    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 Notes better with standing or walking around; much worse sitting, prolonged sitting as he has to do for work.  Last LP was 2014       Sacral pain:  Worse with prolonged sitting as above.  Constant.  MRI pending per Pain Management in [facility].    Hyperlipidemia:  Still awaiting Repatha.  PA pending / issues with insurance.  Watches diet carefully.  Review of Systems:  Review of Systems   Musculoskeletal: Positive for back pain and joint pain. Negative for neck pain.   Neurological: Positive for headaches. Negative for dizziness.       Past Medical History:  Patient Active Problem List   Diagnosis    Hyperlipidemia    Back pain    Parasomnia    Acne, unspecified acne type    Hyperhidrosis    Dural tear    Postlaminectomy syndrome of lumbar region    Coccydynia       Medications were reviewed and updated on the Active Medication List    Medications:    Current Outpatient Medications:     acetaZOLAMIDE (DIAMOX) 250 MG tablet, Take 1 tablet (250 mg) by mouth daily as needed (headache)., Disp: , Rfl:     clonazePAM (KLONOPIN) 0.5 MG tablet, Take 0.5 tablets (0.25 mg) by mouth at bedtime., Disp: 15 tablet,  Rfl: 0    guaiFENesin (MUCINEX) 600 MG Sustained-Release tablet, Take 2 tablets (1,200 mg) by mouth 2 times daily., Disp: 30 tablet, Rfl: 0    oxymetazoline (AFRIN) 0.05 % nasal spray, Spray 2 sprays into each nostril 2 times daily. Recommend limiting use to three days only, Disp: 1 bottle, Rfl: 0    propranolol (INDERAL LA) 60 MG CP24, Take 1 capsule (60 mg) by mouth daily., Disp: 90 each, Rfl: 3    pseudoePHEDrine (SUDAFED 12 HOUR) 120 MG Controlled-Release tablet, Take 1 tablet (120 mg) by mouth 2 times daily as needed for Congestion., Disp: 20 tablet, Rfl: 0    rosuvastatin (CRESTOR) 5 MG tablet, Take 0.5 tablets (2.5 mg) by mouth three times a week., Disp: 30 tablet, Rfl: 1    tretinoin (RETIN-A) 0.025 % cream, Use a pea sized amount on the entire face nightly.  At first, you may use every other night to avoid dryness and irritation, and gradually increase use to nightly as tolerated, Disp: 45 g, Rfl: 3    Psychosocial:  Social History     Socioeconomic History    Marital status: Married     Spouse name: Not on file    Number of children: Not on file    Years of education: Not on file  Highest education level: Not on file   Social Needs    Financial resource strain: Not on file    Food insecurity - worry: Not on file    Food insecurity - inability: Not on file    Transportation needs - medical: Not on file    Transportation needs - non-medical: Not on file   Occupational History    Not on file   Tobacco Use    Smoking status: Never Smoker    Smokeless tobacco: Never Used   Substance and Sexual Activity    Alcohol use: Yes     Comment: 1-3 drinks infrequently    Drug use: Not on file    Sexual activity: Not on file   Other Topics Concern    Not on file   Social History Narrative    PhD in Psychology, works in [employer], [place]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 [employer]       Allergies   Allergen Reactions    Atorvastatin Other     Debilitating myalgias    Pravastatin Other     Debilitating myalgias    Rosuvastatin Other      Debilitating myalgias    Zetia [Ezetimibe] Other     Debilitating myalgias       The Family History:  Family History   Problem Relation Name Age of Onset    Cancer Mother          cervical    No Known Problems Father      Cancer Maternal Grandmother          ovarian    No Known Cancer Maternal Grandfather      Heart Disease Maternal Grandfather      Cancer Paternal Grandmother          pancreatic    Cancer Paternal Grandfather          mesothelioma    Cancer Brother  17        osteosarcoma       Physical Exam:  BP 128/72 (BP Location: Left arm, BP Patient Position: Sitting, BP cuff size: Large)    Pulse 74    Temp 97.8 F (36.6 C) (Oral)    Resp 14    Ht '5\' 8"'$  (1.727 m)    Wt 79.4 kg (175 lb)    SpO2 99%    BMI 26.61 kg/m   Body mass index is 26.61 kg/m.  Physical Exam   Constitutional: He is oriented to person, place, and time and well-developed, well-nourished, and in no distress.   HENT:   Head: Normocephalic and atraumatic.   Mouth/Throat: No oropharyngeal exudate.   Eyes: Conjunctivae are normal. No scleral icterus.   Musculoskeletal: Normal range of motion.   Neurological: He is alert and oriented to person, place, and time. No cranial nerve deficit. Gait normal.   Skin: Skin is warm and dry. He is not diaphoretic.   Psychiatric: Affect normal.   Vitals reviewed.      Studies  Lab Results   Component Value Date    BUN 12 12/15/2016    CREAT 1.11 12/15/2016    CL 102 12/15/2016    NA 141 12/15/2016    K 4.3 12/15/2016    [place] 9.6 12/15/2016    TBILI 0.68 12/15/2016    ALB 4.8 12/15/2016    TP 7.3 12/15/2016    AST 22 12/15/2016    ALK 68 12/15/2016    BICARB 26 12/15/2016    ALT 24 12/15/2016    GLU  93 12/15/2016     No results found for: A1C  Lab Results   Component Value Date    AST 22 12/15/2016    ALT 24 12/15/2016    ALK 68 12/15/2016    TP 7.3 12/15/2016    ALB 4.8 12/15/2016    TBILI 0.68 12/15/2016     Lab Results   Component Value Date    CHOL 233 03/18/2017    HDL 50 03/18/2017     LDLCALC 145 03/18/2017    TRIG 190 (H) 03/18/2017     No results found for: GFRAA  Lab Results   Component Value Date    WBC 4.4 02/11/2016    RBC 5.04 02/11/2016    HGB 15.5 02/11/2016    HCT 44.9 02/11/2016    MCV 89.1 02/11/2016    RDW 11.5 (L) 02/11/2016    PLT 245 02/11/2016    LYMPHS 42 02/11/2016    MONOS 10 02/11/2016    EOS 6 02/11/2016    BASOS 1 02/11/2016     No results found for: INR  No results found for: COLORUA, APPEARUA, [place], BILIUA, KETONEUA, [place], BLOODUA, [place], PROTEINUA, UROBILUA, NITRITEUA, LEUKESTUA, WBCUA, RBCUA, EPITHCELLSUA, HYALINEUA, CRYSTALSUA, COMMENTSUA    The ASCVD Risk score Mikey Bussing DC Jr., et al., 2013) failed to calculate for the following reasons:    The 2013 ASCVD risk score is only valid for ages 75 to 74      Imaging: Outside MRIs of the brain have been performed; not within [place] Maintenance: was reviewed, key updates UTD    ASSESSMENT AND PLAN  Erik Burke is a 37 year old year old male with a who comes in for routine follow up    37 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  He notes today frustrations with how long it took to make the ultimate diagnosis which was figured out by myelogram.  He opts to see Neurology for an evaluation for why his headaches are intermittently occurring as it may be related to his dural tear.  He also questions whether not his chronic pain might be in some way related to his adverse effects from statins in the past.  He had a very significant statin intolerance.  Despite this he is working with Cardiology to get a PCSK9.   Needs refill for propranolol - as below.  Diagnoses and all orders for this visit:    Dural tear -   -     Neurology Clinic    Screened negative for depression    Parasomnia    Hyperhidrosis      Patient Instructions   See Dr. Latricia Heft -    Continue all medicines  See me in 9 months for an annual or sooner as needed        Orders this encounter:  Lab No orders of the defined types were placed  in this encounter.    Imaging No orders of the defined types were placed in this encounter.    Procedures No orders of the defined types were placed in this encounter.    Other   Orders Placed This Encounter   Procedures    Neurology [place]  Return to clinic in 25month(s) for followup/annual    .

## 2017-04-29 ENCOUNTER — Telehealth (HOSPITAL_COMMUNITY): Payer: Self-pay | Admitting: Cardiovascular Disease

## 2017-04-29 NOTE — Telephone Encounter (Signed)
mychart    Dear [name], thanks for providing the paperwork. I do not suspect high incidence of false negative with this testing, however, as you could imagine, those are the most common genetic mutations, and there might be other less common ones, that we are currently unaware, or do not have ways of testing for.  Right now we normally check those 4 genes, the same ones you were tested for.  One more thing I am trying - I placed a consultation for managed care pharmacist, to see if they could help [place] get Repatha or Praluent for you.  Thanks!  Charline Bills, MD  Assistant Professor of Medicine  Division of Cardiovascular Medicine  [email or web address]

## 2017-04-29 NOTE — Telephone Encounter (Signed)
MYCHART FOLLOW UP.    Burke, Erik Dill., MD 4 days ago         Hi Dr. Cherylann Ratel,     See page 4 of the attached document. Is that enough information? It reads:     "Negative Genetic testing for APOB LDLR LDLRAP1 PCSK9"     For some reason I'm unable to locate the actual paperwork from [facility], but I can go into my own archives or request medical records if Page 4 is not enough info. Also, feel free to speak with Dr. Hilario Quarry if you wish, I'm happy to sign an ROI if necessary.     What is the probability of the above genetic testing being either a false negative or insufficient?     I'll include Pages 1-6 in another message (limited on # of uploads)     Thanks!   [name]

## 2017-04-29 NOTE — Telephone Encounter (Signed)
Patient sent 4 separate MyChart messages last week.    Routing to Dr. Cherylann Ratel for review.       Benedetto Coons RN, Clinic Nurse   [facility]

## 2017-04-30 ENCOUNTER — Encounter (HOSPITAL_COMMUNITY): Payer: Self-pay | Admitting: Cardiovascular Disease

## 2017-04-30 ENCOUNTER — Other Ambulatory Visit (HOSPITAL_COMMUNITY): Payer: Self-pay | Admitting: Cardiovascular Disease

## 2017-04-30 DIAGNOSIS — I2584 Coronary atherosclerosis due to calcified coronary lesion: Secondary | ICD-10-CM

## 2017-04-30 DIAGNOSIS — E782 Mixed hyperlipidemia: Principal | ICD-10-CM

## 2017-04-30 DIAGNOSIS — I2510 Atherosclerotic heart disease of native coronary artery without angina pectoris: Secondary | ICD-10-CM

## 2017-04-30 MED ORDER — ALIROCUMAB 75 MG/ML SC SOPN
75.0000 mg | PEN_INJECTOR | SUBCUTANEOUS | 3 refills | Status: DC
Start: 2017-04-30 — End: 2017-04-30

## 2017-04-30 MED ORDER — ALIROCUMAB 75 MG/ML SC SOPN
75.0000 mg | PEN_INJECTOR | SUBCUTANEOUS | 3 refills | Status: DC
Start: 2017-04-30 — End: 2017-06-09

## 2017-04-30 MED ORDER — ALIROCUMAB 75 MG/ML SC SOPN
PEN_INJECTOR | SUBCUTANEOUS | 3 refills | Status: DC
Start: 2017-04-30 — End: 2017-06-09

## 2017-04-30 MED ORDER — ALIROCUMAB 75 MG/ML SC SOPN
PEN_INJECTOR | SUBCUTANEOUS | 3 refills | Status: AC
Start: 2017-04-30 — End: 2018-04-30
  Filled 2017-06-10: qty 2, 28d supply, fill #0
  Filled 2017-08-01: qty 2, 28d supply, fill #1

## 2017-04-30 NOTE — Telephone Encounter (Signed)
From: [name]  To: Sherron Monday., MD  Sent: 04/25/2017 8:35 AM PST  Subject: 20-Other    Records pages 4-6

## 2017-05-01 ENCOUNTER — Telehealth (HOSPITAL_COMMUNITY): Payer: Self-pay | Admitting: Cardiovascular Disease

## 2017-05-01 NOTE — Telephone Encounter (Signed)
Auth submitted to [facility]. Referral #: [id number] under Referrals tab in [place].

## 2017-05-01 NOTE — Telephone Encounter (Addendum)
MYCHART FOLLOW UP.      Katee Wentland Picardo-Dealva Lafoy RN, Clinic Nurse   [facility]  Cardiology/ Heart Failure Clinic

## 2017-05-01 NOTE — Telephone Encounter (Signed)
MYCHART FOLLOW UP.    Sutch, Yves Dill., MD 6 days ago         Hi Dr. Cherylann Ratel,     See page 4 of the attached document. Is that enough information? It reads:     "Negative Genetic testing for APOB LDLR LDLRAP1 PCSK9"     For some reason I'm unable to locate the actual paperwork from [facility], but I can go into my own archives or request medical records if Page 4 is not enough info. Also, feel free to speak with Dr. Hilario Quarry if you wish, I'm happy to sign an ROI if necessary.     What is the probability of the above genetic testing being either a false negative or insufficient?     I'll include Pages 1-6 in another message (limited on # of uploads)     Thanks!   [name]

## 2017-05-01 NOTE — Telephone Encounter (Signed)
From: [name]  To: Sherron Monday., MD  Sent: 04/25/2017 8:31 AM PST  Subject: Repatha    Hi Dr. Cherylann Ratel,    See page 4 of the attached document. Is that enough information? It reads:    "Negative Genetic testing for APOB LDLR LDLRAP1 PCSK9"    For some reason I'm unable to locate the actual paperwork from [facility], but I can go into my own archives or request medical records if Page 4 is not enough info. Also, feel free to speak with Dr. Hilario Quarry if you wish, I'm happy to sign an ROI if necessary.    What is the probability of the above genetic testing being either a false negative or insufficient?    I'll include Pages 1-6 in another message (limited on # of uploads)    Thanks!  [name]      ----- Message -----  From: Sherron Monday, MD  Sent: 04/24/17, 6:57 PM  To: [name]  Subject: Repatha    Dear Rodman Key, insurance denied Repatha again. You have mentioned that genetic testing was negative in the past, do you mind sending me that info, so we will make sure we are not missing anything.  Thanks!  Charline Bills, MD  Assistant Professor of Medicine  Division of Cardiovascular Medicine  [email or web address]

## 2017-05-02 NOTE — Telephone Encounter (Signed)
[place] [facility]- patient sent 4 total MyChart message regarding same issue.    Routing to Dr. Cherylann Ratel for review.       Benedetto Coons RN, Clinic Nurse   [facility]

## 2017-05-07 ENCOUNTER — Encounter (HOSPITAL_BASED_OUTPATIENT_CLINIC_OR_DEPARTMENT_OTHER): Payer: Self-pay

## 2017-05-07 NOTE — Telephone Encounter (Signed)
Addressed under different encounter.

## 2017-05-08 ENCOUNTER — Telehealth (HOSPITAL_COMMUNITY): Payer: Self-pay | Admitting: Cardiovascular Disease

## 2017-05-08 NOTE — Telephone Encounter (Signed)
[place] from cover my meds phone [phone] ref# [place] states on 05/02/17, 05/05/17 and today faxed prior auth appeal form to [phone].  Please verify receipt

## 2017-05-10 ENCOUNTER — Encounter (INDEPENDENT_AMBULATORY_CARE_PROVIDER_SITE_OTHER): Payer: Self-pay

## 2017-05-10 ENCOUNTER — Telehealth (HOSPITAL_COMMUNITY): Payer: Self-pay | Admitting: Cardiovascular Disease

## 2017-05-10 NOTE — Telephone Encounter (Signed)
PA currently approved for praulent.   Called [facility] [facility] managed care approval-05/01/2017-10/29/2020.   [facility] will call back. Billing may need to input manually due to medication running as not approved. Awaiting call back from pharmacy once medication running through. Will inform patient once available.

## 2017-05-13 ENCOUNTER — Encounter (HOSPITAL_COMMUNITY): Payer: Self-pay

## 2017-05-13 ENCOUNTER — Telehealth (HOSPITAL_COMMUNITY): Payer: Self-pay | Admitting: Cardiovascular Disease

## 2017-05-13 ENCOUNTER — Encounter (INDEPENDENT_AMBULATORY_CARE_PROVIDER_SITE_OTHER): Payer: Self-pay | Admitting: Dermatology

## 2017-05-13 DIAGNOSIS — E782 Mixed hyperlipidemia: Secondary | ICD-10-CM

## 2017-05-13 NOTE — Telephone Encounter (Signed)
Informed patient of approval via mychart. Patient called me to follow up with approval. Patient stated he's been waiting 3 years for this medication to be approved.     Patient had a follow up question. Patient wondering if he should continue crestor with praulent.

## 2017-05-13 NOTE — Telephone Encounter (Addendum)
Patient asking if he is to take the Crestor once he starts the [place].    See MD's clinic note below ([facility] 03-20-17)-  Assessment/Plan:  *hyperlipidemia:   *statin intolerance:  -attempted statin re-challenge, and the patient was only able to tolerate 2.5 of rosuvastatinQOD, unable to tolerate 5 mg QOD due to muscle aches. Inadequate control of LDL with 2.5 mg of rosuvastatinQOD in a young patient with very high Iselin score. At high risk for cardiovascular events. Will attempt to resubmit prior authorizationfor PCSK9 inhibitor.    Additionally, in the past the patient already tried Lipitor, pravastatin, Zetia,and all caused debilitating myalgias. 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 Will start with 140 q2 weeks.  Continue dietary modifications.      ROUTING TO DR. [place]el[place] OF HIS CRESTOR AND [place]-  ?? Patient to stop the Crestor once the Praluent has been started.      [facility]

## 2017-05-14 ENCOUNTER — Encounter (INDEPENDENT_AMBULATORY_CARE_PROVIDER_SITE_OTHER): Payer: Self-pay | Admitting: Dermatology

## 2017-05-14 NOTE — Telephone Encounter (Signed)
Sent phone/ text message to MD to please review encounter.  ?? If patient is to stop the Crestor then start the Praluent injections the following day.      [facility]

## 2017-05-14 NOTE — Telephone Encounter (Signed)
From: [name]  To: Evelina Bucy  Sent: 05/13/2017 10:19 PM PST  Subject: 1-Non Urgent Medical Advice    Hi Dr. Gwynneth Munson,    I'm due for another round of face wash and wanted to know what's the difference between (products containing) benzoyl peroxide and salicylic acid? Thanks! -[name]      Here is your original recommendations:    Recommendations:   - We discussed the importance of gentle skin care and to avoid of picking   -Use only NON-COMEDOGENIC products on your skin   -Moisturize with a non-comedogenic moisturizer such as Cetaphil cream as well as regular use of a broad spectrum sunblock with SPF 30+ (consider products by Neutrogena, Eucerin or Cetaphil).   -Wash in the morning with a benzoyl peroxide containing product such as Neutrogena Clear Pore.   -Wash face with gentle wash such as Cetaphil liquid cleanser for routine cleansing at night      -Apply pea-sized amount of tretinoin 0.025% once nightly to entire face. This may cause dryness, redness, and irritation, especially when starting treatment. For the first few weeks, start on an every-other night basis, and gradually increase over a few weeks to nightly use. Use in combination with moisturizer, as above. This product has not been demonstrated to be safe to use in pregnancy.

## 2017-05-15 ENCOUNTER — Encounter (HOSPITAL_BASED_OUTPATIENT_CLINIC_OR_DEPARTMENT_OTHER): Payer: Self-pay

## 2017-05-15 ENCOUNTER — Encounter (INDEPENDENT_AMBULATORY_CARE_PROVIDER_SITE_OTHER): Payer: Self-pay | Admitting: Dermatology

## 2017-05-15 ENCOUNTER — Ambulatory Visit (INDEPENDENT_AMBULATORY_CARE_PROVIDER_SITE_OTHER): Payer: No Typology Code available for payment source | Admitting: Dermatology

## 2017-05-15 DIAGNOSIS — Z86018 Personal history of other benign neoplasm: Secondary | ICD-10-CM

## 2017-05-15 DIAGNOSIS — L814 Other melanin hyperpigmentation: Secondary | ICD-10-CM

## 2017-05-15 DIAGNOSIS — L700 Acne vulgaris: Secondary | ICD-10-CM

## 2017-05-15 DIAGNOSIS — D229 Melanocytic nevi, unspecified: Secondary | ICD-10-CM

## 2017-05-15 NOTE — Patient Instructions (Signed)
[facility]    SUN PROTECTION      Why protect against the sun?  In the past, sun exposure was thought to be a healthy benefit of outdoor activity. However, studies have shown many unhealthy effects of sun exposure, such as early aging of the skin and skin cancer.  What kind of damage does sun exposure cause?  Part of the sun's energy that reaches earth is composed of rays of invisible ultraviolet (UV) light. When ultraviolet light rays (UVA and UVB) enter the skin, they damage skin cells, causing visible and invisible injuries.  Sunburn is a visible type of damage, which appears just a few hours after sun exposure. In many people, this type of damage also causes tanning. Freckles, which occur in people with fair skin, are usually due to sun exposure. Freckles are nearly always a sign that sun damage has occurred, and therefore show the need for sun protection.  Ultraviolet light rays also cause invisible damage to skin cells. Some of the injury is repaired, but some of the cell damage adds up year after year. After 20 to 30 years or more, the built-up damage appears as wrinkles, age spots, and even skin cancer. Although window glass blocks UVB light, UVA rays are able to penetrate through glass.  How can I protect myself from excessive sun exposure?  . Avoidance. Stay away from the sun in the middle of the day.  Sun exposure is more intense closer to the equator, in the mountains, and in the summer. The sun's damaging effects are increased by reflection from water, white sand, and snow. Avoid long periods of direct sun exposure. Sit or play in the shade, especially when your shadow is shorter than you are tall.  . Use sun protective clothing.  Cover up with light colored clothing when outdoors, including a hat to protect the scalp and face. In addition to filtering out the sun, tightly woven clothing reflects heat and helps keep you feeling cool. Sunglasses that block ultraviolet rays protect the eyes and  eyelids. Multiple retailers now sell sun protective clothing for adults and children.  Rash guards should be worn during outdoor swimming activity.  . Block sun damage by applying a broad-spectrum UVA and UVB sunscreen with an SPF of 30 or higher and reapply approximately every two hours, even on cloudy days. If swimming or participating in intense physical activity, sunscreen may need to be applied more often.  How do I select the right sunscreen?    Choose a sunscreen with a SPF 30 or higher. The protective ability of sunscreen is rated by Sun Protection Factor (SPF) -- the higher the SPF, the stronger the protection. Spread it evenly over all uncovered skin, including ears and lips, but avoid the eyelids. Apply sunscreen about 30 minutes before sun exposure. Re-apply after swimming or excessive sweating.    Most importantly, choose a sunscreen that you will wear.  New sunscreens are added to the marketplace frequently, and selection of a particular brand is often a matter of personal preference.       What about the controversies regarding sunscreens?    Hats, clothing, and shade are the most reliable forms of sun protection.  Few people use enough sunscreen to benefit from the SPF protection listed on the label. Most practitioners in our group continue to recommend and utilize many sunscreen products, including chemical and physical sunscreens.  However, studies show that people typically use about a quarter of the recommended amount.       There has been much news coverage recently about the possible dangers of sunscreens. The Environmental Working Group publishes an annual sunscreen report.  The 2010 report, which received a considerable amount of media attention, cited concerns about a form of vitamin A called retinyl palmitate, found in many sunscreens. Safety testing is far from conclusive, and the FDA continues to investigate whether this compound may accelerate skin damage and elevate skin cancer risk when  applied to skin exposed to sunlight.      Many have also raised concerns about chemical sunscreens, which contain substances such as oxybenzone, avobenzone, benzophenone, and parsol 1789. Some believe that these may be converted into hormones when absorbed through the skin. Some prefer physical agents such as zinc oxide or titanium dioxide, however efforts to make these agents more sheer and cosmetically acceptable have resulted in formulations that may contain tiny particles known as "nanoparticles."  Concerns also exist about their absorption.  Most of the above concerns are theoretical; however, if you are highly concerned about these issues, you can visit the Environmental Working Group's website devoted to sunscreen safety: www.ewg.org/2010 sunscreen.   Most dermatologists remain convinced that the risks of unprotected sun exposure far outweigh the above mentioned theoretical risks of sunscreens. The type of sunprotection used remains an individual choice for each parent.    What about vitamin D?    Vitamin D is essential for many processes in the body, and is important for bone growth in children.  Over the last few years, many studies have suggested an association between low vitamin D levels and increased risk of certain types of cancers, neurologic disease, autoimmune disease and cardiovascular disease.  While dermatologists agree that the sun is certainly a source of vitamin D, we are also uniquely aware it is also a source of harmful ultraviolet radiation resulting in thousands of skin cancers each year.  The official recommendation of the American Academy of Dermatology (AAD) is that vitamin D should be obtained through dietary sources and supplementation rather than from sunlight (ultraviolet radiation).

## 2017-05-15 NOTE — Telephone Encounter (Signed)
mychart    Dear [name], I am very happy about the approval! Regarding rosuvastatin, please continue taking it until we have repeat labs, and we will decide then. Please do blood work in ~2 months.   Thanks!  Charline Bills, MD  Assistant Professor of Medicine  Division of Cardiovascular Medicine  [email or web address]

## 2017-05-15 NOTE — Progress Notes (Signed)
Primary MD: [name]  Consult Requested By: [name]    HISTORY:  Erik Burke is a 37 year old male with no history of skin cancer, does have a history of dysplastic nevi who presents for evaluation of skin changes given history of UV exposure, sun damage.     - LOV: 05/2016 with Dr Gwynneth Munson for acne  - Diagnoses/treatments: acne - start tretinoin 0.025% cream, BPO  - Biopsies: none    Today, patient notes several moles on trunk, legs. None changing, growing, bleeding. No new moles. Acne well controlled, uses tretinoin, BPO. Wife worried about products with parabens.     Social History:   - grew up in [place], [employer], undergrad at [employer]  - Investment banker, operational, Biomedical engineer in [place]    Past Medical History:  - The patient's dermatologic medical history is negative for melanoma, negative for NMSC  - The patient's dermatologic family history is negative for melanoma, and negative for other skin disease.    - The patient  has a past medical history of Acne, Back pain, Hyperhidrosis, Hyperlipidemia, and Parasomnia associated with sexual behavior.  - The medications were reviewed.  - Allergies: Atorvastatin; Pravastatin; Rosuvastatin; and Zetia [ezetimibe]    REVIEW OF SYSTEMS:  CONSTITUTIONAL: negative for fever or chills  DERMATOLOGIC: Feels well, no other skin complaints.    PHYSICAL EXAM:  Skin Type:                    2  General Appearance: within normal limits  Neuro: Alert and oriented x 3  Psych: Mood and affect within normal limits  Eyes: Inspection of lids and conjunctiva within normal limits  Cardiovascular: Inspection of peripheral vascular system showed no swelling, edema.   Extremities: inspection of digits and nails wnl    Skin: The areas examined included scalp, face, neck, RUE, LUE, chest, abdomen, back, RLE, LLE, buttock  All was normal except for the following:    - On the trunk are multiple brown, <42mm, smooth bordered macules and papules.   - tan feathery macules in  sun exposed areas  - On the lower back is a well healed scar without nodularity or pigmentation  - Dyschromic skin with accentuation of skin lines in sunexposed areas      ASSESSMENT AND TREATMENT PLAN:  Nikola was seen today for recheck. Diagnoses and all orders for this visit:      Acne vulgaris: comedonal. Well controlled today  - pathophysiology of acne discussed  - educated the patient on the treatment options for acne including topical benzoyl peroxide, topical benzoyl peroxide/antibiotics combo, topical retinoids, oral antibiotics, oral retinoids    -- cont tretinoin 0.025% cream qhs - Apply a pea sized amount to your entire face every night. You can mix this medication with moisturizer or apply less frequently if too drying or irritating.   Discussed retinoid SE of dryness, redness, increased sun sensitivity.   -- cont benzoyl peroxide wash daily   Discussed BPO SE of drying, redness, bleaching of clothes, towels and sheets, contact allergies.     Multiple melanocytic nevi  - Patient reassured  - Discussed ABCDEs of concerning nevi, self skin exams and encouraged to return with any new, changing or concerning lesions    Sun damage  - The nature of sun-induced photo-aging and skin cancers has been discussed.  Recommended sun avoidance, protective clothing/hats, regular use of at least 30-SPF sunscreens.    Lentigines  - Patient  reassured of benign nature, no treatment indicated.    History of dysplastic nevi, Scar: site lower back, no e/o recurrence  - Patient was reassured and encouraged f/u for routine skin checks  - Discussed at length importance of sun protection, including avoidance of sun at peak hours (10 am-2 pm), protective clothing, and use of sunscreen with SPF 30 or greater with frequent reapplication (every 1 to 1.5 hours, or more frequently if in water or if heavy sweating).        - f/u 1 year TBC      Lennox Pippins, MD.   [facility], [facility]  Department of Dermatology

## 2017-05-16 ENCOUNTER — Telehealth (HOSPITAL_COMMUNITY): Payer: Self-pay | Admitting: Cardiovascular Disease

## 2017-05-16 NOTE — Telephone Encounter (Signed)
MYCHART FOLLOW UP.    [place], Yves Dill., MD 17 hours ago (10:56 PM)         Hello Dr. Cherylann Ratel,     I'm very happy about the approval, thank you for your persistence with this!     Do you mind if I temporarily go off rosuvastatin? I have an appointment coming up for my lower back issues and I want to be able to confidently distinguish between this persistent pain from back issues and statin side effects (which I've experienced in abundance in the past). Then I'm happy to go back on it.     Thanks for considering,   Arbutus Ped Picardo-Braylie Badami RN, Clinic Nurse  [facility]

## 2017-05-16 NOTE — Telephone Encounter (Signed)
From: [name]  To: Sherron Monday., MD  Sent: 05/15/2017 10:56 PM PST  Subject: crestor    Hello Dr. Cherylann Ratel,    I'm very happy about the approval, thank you for your persistence with this!    Do you mind if I temporarily go off rosuvastatin? I have an appointment coming up for my lower back issues and I want to be able to confidently distinguish between this persistent pain from back issues and statin side effects (which I've experienced in abundance in the past). Then I'm happy to go back on it.     Thanks for considering,  Erik Burke      ----- Message -----  From: Sherron Monday, MD  Sent: 05/15/17, 8:32 AM  To: [name]  Subject: crestor    Dear Erik Burke, I am very happy about the approval! Regarding rosuvastatin, please continue taking it until we have repeat labs, and we will decide then. Please do blood work in ~2 months.   Thanks!  Erik Bills, MD  Assistant Professor of Medicine  Division of Cardiovascular Medicine  [email or web address]

## 2017-05-16 NOTE — Telephone Encounter (Addendum)
MYCHART FOLLOW UP.    ROUTING TO DR. Gratis REVIEW- patient has question regarding his Crestor.      Benedetto Coons RN, Clinic Nurse   [facility]

## 2017-05-18 NOTE — Telephone Encounter (Signed)
mychart    That would be OK, go ahead and stop it, and do your repeat labs in ~2 months off rosuvastatin.  Thanks!  Charline Bills, MD  Assistant Professor of Medicine  Division of Cardiovascular Medicine  [email or web address]    ===View-only below this line===      ----- Message -----     From: Erik Burke     Sent: 05/15/2017 10:56 PM PST       To: Sherron Monday, MD  Subject: crestor    Hello Dr. Cherylann Ratel,    I'm very happy about the approval, thank you for your persistence with this!    Do you mind if I temporarily go off rosuvastatin? I have an appointment coming up for my lower back issues and I want to be able to confidently distinguish between this persistent pain from back issues and statin side effects (which I've experienced in abundance in the past). Then I'm happy to go back on it.     Thanks for considering,  Erik Burke      ----- Message -----  From: Sherron Monday, MD  Sent: 05/15/17, 8:32 AM  To: Erik Burke  Subject: crestor    Dear [name], I am very happy about the approval! Regarding rosuvastatin, please continue taking it until we have repeat labs, and we will decide then. Please do blood work in ~2 months.   Thanks!  Charline Bills, MD  Assistant Professor of Medicine  Division of Cardiovascular Medicine  [email or web address]

## 2017-06-05 ENCOUNTER — Ambulatory Visit (INDEPENDENT_AMBULATORY_CARE_PROVIDER_SITE_OTHER): Payer: No Typology Code available for payment source | Admitting: Neurology

## 2017-06-06 ENCOUNTER — Encounter (HOSPITAL_COMMUNITY): Payer: Self-pay

## 2017-06-10 ENCOUNTER — Other Ambulatory Visit: Payer: Self-pay

## 2017-06-12 ENCOUNTER — Other Ambulatory Visit: Payer: Self-pay

## 2017-06-12 ENCOUNTER — Encounter (INDEPENDENT_AMBULATORY_CARE_PROVIDER_SITE_OTHER): Payer: Self-pay | Admitting: Neurology

## 2017-06-12 ENCOUNTER — Encounter (INDEPENDENT_AMBULATORY_CARE_PROVIDER_SITE_OTHER): Payer: Self-pay

## 2017-06-12 ENCOUNTER — Ambulatory Visit (INDEPENDENT_AMBULATORY_CARE_PROVIDER_SITE_OTHER): Payer: No Typology Code available for payment source | Admitting: Neurology

## 2017-06-12 ENCOUNTER — Encounter (HOSPITAL_BASED_OUTPATIENT_CLINIC_OR_DEPARTMENT_OTHER): Payer: Self-pay

## 2017-06-12 VITALS — BP 134/88 | HR 104 | Resp 16 | Ht 68.0 in | Wt 170.0 lb

## 2017-06-12 DIAGNOSIS — H9311 Tinnitus, right ear: Secondary | ICD-10-CM

## 2017-06-12 DIAGNOSIS — E785 Hyperlipidemia, unspecified: Secondary | ICD-10-CM

## 2017-06-12 DIAGNOSIS — M533 Sacrococcygeal disorders, not elsewhere classified: Principal | ICD-10-CM

## 2017-06-12 DIAGNOSIS — R51 Headache: Secondary | ICD-10-CM

## 2017-06-12 DIAGNOSIS — Z9889 Other specified postprocedural states: Secondary | ICD-10-CM

## 2017-06-12 DIAGNOSIS — R519 Headache, unspecified: Secondary | ICD-10-CM

## 2017-06-12 DIAGNOSIS — R61 Generalized hyperhidrosis: Secondary | ICD-10-CM

## 2017-06-12 NOTE — Progress Notes (Signed)
---------------------(data below generated by Elishua Saras, MD)------------------------      HEADACHE CLINIC NOTE  Demographics:  Patient Name: Erik Burke  Medical Record #: [id number]  DOB: 06/30/1980  Age: 37 year old  Sex: male    Consult Requested by:  [name], MD      ----------    Chief Complaint   Patient presents with    Pressure on back of the eye     Sacral Pain         History was obtained directly from the patient.     History of Present Illness:     Erik Burke is an 37 year old right handed male with a chief complaint of  :   - diffuse sacral and buttocks pain when sitting for prolonged periods, changing positions, standing for prolonged periods.  - worsened sacral and coccyx pain when coughing sneezing or any other type of Valsalva maneuver  -  pressure behind eyes also in the back of the neck with light sensation when eyes closed.  -distorted vision when sitting for prolonged periods  - dizziness with nausea which happen when sitting for prolonged periods  - tingling in perianal area   - in bright used hearing in right ear with echoing sensation       Patient is currently seeing a pain specialist at [facility] who communicates with neuroradiologist who initially treated him few years ago when he had diagnosis of CSF leak.  The possibility of Tarlov cyst suggested.      -  He had diffuse sacral pain , radiate to buttocks , then goes to back of eyes . Inly happen if he sits for extend amount  of time .       -  Pressure behind the eye , gets worse with position : not necessary with standing up .      -------------------------------------------------------------------------------------------    The patient thinks the headache can be triggered by -.     Her headache worsens with any kind of exertion, even going up or down stairs.      ---------------------------------------------------------------------------------------------  - The patient has lack of sleep.  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    He also had laminectomy ,  ...      -----------------------------------------------      ------------------------------------------------    Allergies:   Allergies   Allergen Reactions    Atorvastatin Other     Debilitating myalgias    Pravastatin Other     Debilitating myalgias    Rosuvastatin Other     Debilitating myalgias    Zetia [Ezetimibe] Other     Debilitating myalgias       Medications:  Current Outpatient Medications   Medication Sig    acetaZOLAMIDE (DIAMOX) 250 MG tablet Take 1 tablet (250 mg) by mouth daily as needed (headache).    alirocumab (PRALUENT) 75 MG/ML SOPN injection pen INJECT 1 ML (75 MG) UNDER THE SKIN EVERY 14 DAYS.    clonazePAM (KLONOPIN) 0.5 MG tablet Take 0.5 tablets (0.25 mg) by mouth at bedtime.    Evolocumab 140 MG/ML SOAJ INJECT 140 MG UNDER THE SKIN EVERY  14 DAYS.    guaiFENesin (MUCINEX) 600 MG Sustained-Release tablet Take 2 tablets (1,200 mg) by mouth 2 times daily.    oxymetazoline (AFRIN) 0.05 % nasal spray Spray 2 sprays into each nostril 2 times daily. Recommend limiting use to three days only    propranolol (INDERAL LA) 60 MG CP24 Take 1 capsule (60 mg) by mouth daily.    pseudoePHEDrine (SUDAFED 12 HOUR) 120 MG Controlled-Release tablet Take 1 tablet (120 mg) by mouth 2 times daily as needed for Congestion.    rosuvastatin (CRESTOR) 5 MG tablet Take 0.5 tablets (2.5 mg) by mouth three times a week.    tretinoin (RETIN-A) 0.025 % cream Use a pea sized amount on the entire face nightly.  At first, you may use every other night to avoid dryness and irritation, and gradually increase use to nightly as tolerated     No current facility-administered medications for this  visit.      CURES:          PSH:  Past Surgical History:   Procedure Laterality Date    epidural steroid injection      x5 ESIs, most ecent 2017    l3/4 laminectomy  2014    @ [facility], 2014       Family History   Problem Relation Name Age of Onset    Cancer Mother          cervical    No Known Problems Father      Cancer Maternal Grandmother          ovarian    No Known Cancer Maternal Grandfather      Heart Disease Maternal Grandfather      Cancer Paternal Grandmother          pancreatic    Cancer Paternal Grandfather          mesothelioma    Cancer Brother  17        osteosarcoma       Headache Family History: history of headache in no family members.    Social Hx:  Social History     Socioeconomic History    Marital status: Married     Spouse name: Not on file    Number of children: Not on file    Years of education: Not on file    Highest education level: Not on file   Occupational History    Not on file   Tobacco Use    Smoking status: Never Smoker    Smokeless tobacco: Never Used   Substance and Sexual Activity    Alcohol use: Yes     Comment: 1-3 drinks infrequently    Drug use: Not on file    Sexual activity: Not on file   Social Activities of Daily Living Present    Not on file   Social History Narrative    PhD in Psychology, works in [employer], [place]       Other Social History: No other social history elements noted today.    Review of Systems:    Constitutional:  negative for fever, chills, night sweats; otherwise negative.   Eyes:  negative for redness and tearing, denies double vision;    Ears, Nose, Mouth, Throat:  negative for sore throat, hearing loss, dental problems; + ear ringing   Cardiovascular:  negative for chest pain, orthopnea; lower extremity edema;   Respiratory:  negative for cough, shortness of breath, wheezing; .   GI:  positive for nausea;  negative for constipation;   GU:  negative.   Musculoskeletal:  + back pain   Integumentary:  negative for  rash.   Endocrine:  negative.   Psychiatric:  positive for anxiety; otherwise negative.   Neurologic:  positive for headache; otherwise negative.    Exam:   On Exam today, I find:   Vital signs: Recorded in chart, and reviewed by me today.  BP 134/88 (BP Location: Left arm, BP Patient Position: Sitting, BP cuff size: Regular)    Pulse 104    Resp 16    Ht 5\' 8"  (1.727 m)    Wt 77.1 kg (170 lb)    SpO2 100%    BMI 25.85 kg/m     Physical Examination    Head and Neck:     Head is normocephalic.      Tenderness is present in the following area(s):   - Severe:  - Moderate:  - Mild:       TMJ examination:  Jaw asymmetry: absent    ENT examination:  NL     Neurological Exam     Mental status:  Alert and oriented.   Language:  Fluency normal, comprehension normal, repetition normal, naming normal, reading normal, writing normal.        Speech:  Normal phonation and articulation.     Cranial Nerves :  - CN I:  Not tested  - CN II:  Both pupils are symmetric and equally reactive to light.  On fundoscopic - examination, normal optic disc OD and OS.  No evidence of increased intracranial pressure.  Peripheral vision is intact.  - CN III, IV, VI:  Extraocular movements are intact.  No ptosis.  No pathological nystagmus.  No APD.  - CN V:  Normal light touch in V1, V2, and V3 distributions.  - CN VII:  Symmetrical nasolabial folds and forehead wrinkles; normal orbicularis oculi strength bilaterally.  - CN VIII:  Normal bedside hearing exam.  - CN IX and X:  Palatal movements are full and symmetric.  Uvula is in midline.  - CN XI:  Normal sternoclaidomastoid strength; normal shoulder shrug.  - CN XII:  No tongue deviation; no tongue weakness.     Motor Examination :    - Muscle bulk:  Normal.  - Strength:  Strength is 5/5 in all muscle groups.  - Reflexes:  1+ in upper extremities, 2+ in lower extremities, and all symmetrical.    - Toes are down going bilaterally.     Sensory Examination:  normal in all modalities (light  touch, pain/pinprick/temperature, and vibration/position).     Gait:  steady without evidence of ataxia; normal toe walking and heel walking; normal tandem gait.     Coordination:  Finger tapping is normal; no tremor.    Psychiatric Examination    - Mood:  stable.      Cardiovascular Examination  - Heart has regular rhythm and no murmurs    Pulmonary Examination:   -  Normal breath sounds without any wheezing or crackles.    Skin Examination:    - No rash or redness.      Labs/Studies(report):         None    Imaging reviewed by me:    NONE     ----------------------------------------------------------------------------------------------------------------      Assessment/Plan :       Although patient's complain is not headache and he has been schedule at [facility] because lack of triaged in our system, but  I will address his concern which is mainly related to his sacral and Lumar pain ; although I explained that this is out of my subspecialty area and I might end up refer him to proper specialist    1. Sacral  pain     S1 tarlov cyst can lead to decompression of the adjacent S1 nerve root.  Therefore I am going to order MRI of the lumbosacral area:   - MRI Lumbar Spine WO/W IV Contrast; Future    2. History of laminectomy     Sees Pain management at [facility] ( Dr. Pleas Patricia ) which told  he needs to see Neurology    - MRI Lumbar Spine WO/W IV Contrast; Future    3. 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  I am going to check MRI of the brain to see if there is any sequela of his previous CSF leak or any other possible intracranial pathology exist:     - MRI Brain WO/W Contrast; Future  - Follow Up in This Department   Had abdominal embolization Coil     4. Tinnitus of right ear    - MRI Brain WO/W  Contrast; Future    5. Hyperlipidemia, unspecified hyperlipidemia type      6 .Hyporeflexia       7. Hyperhidrosis     On propranolol      I Spent about 55 min face- to -face visit  with more than 50 percent of  counseling or coordinating a patients care.       Corbin Saras, MD, [facility]    Neurologist/Headache Specialist

## 2017-06-13 ENCOUNTER — Other Ambulatory Visit: Payer: Self-pay

## 2017-06-13 NOTE — Telephone Encounter (Signed)
From: [name]  To: Braxston Saras, MD  Sent: 06/12/2017 3:36 PM PST  Subject: 2-Procedural Question    Hi Dr. Latricia Heft, I just realized I forgot to ask you something during the appointment, I apologize for this follow up message. Is the ordered MRI of lumbosacral region or just lumbar? If the latter, do you think it's worth getting imaging of sacral region in addition to lumbar region given the diffuse pain is in the sacral/coccyx region? Thank you -[name]

## 2017-06-14 ENCOUNTER — Encounter (HOSPITAL_BASED_OUTPATIENT_CLINIC_OR_DEPARTMENT_OTHER): Payer: Self-pay

## 2017-07-04 ENCOUNTER — Other Ambulatory Visit: Payer: Self-pay

## 2017-07-10 NOTE — Progress Notes (Deleted)
Primary MD: [name]  Consult Requested By: [name]    HISTORY:  Erik Burke is a 37 year old male with no history of skin cancer, does have a history of dysplastic nevi who presents for evaluation of skin changes given history of UV exposure, sun damage.     - LOV: 04/2017 for skin check    Today, patient notes lesion on the *** eyelid, present ***.     several moles on trunk, legs. None changing, growing, bleeding. No new moles. Acne well controlled, uses tretinoin, BPO. Wife worried about products with parabens.     Social History:   - grew up in [place], [employer], undergrad at [employer]  - Investment banker, operational, Biomedical engineer in [place]    Past Medical History:  - The patient's dermatologic medical history is negative for melanoma, negative for NMSC  - The patient's dermatologic family history is negative for melanoma, and negative for other skin disease.    - The patient  has a past medical history of Acne, Back pain, Hyperhidrosis, Hyperlipidemia, and Parasomnia associated with sexual behavior.  - The medications were reviewed.  - Allergies: Atorvastatin; Pravastatin; Rosuvastatin; and Zetia [ezetimibe]    REVIEW OF SYSTEMS:  CONSTITUTIONAL: negative for fever or chills  DERMATOLOGIC: Feels well, no other skin complaints.    PHYSICAL EXAM:  Skin Type:                    2  General Appearance: within normal limits  Neuro: Alert and oriented x 3  Psych: Mood and affect within normal limits  Eyes: Inspection of lids and conjunctiva within normal limits  Cardiovascular: Inspection of peripheral vascular system showed no swelling, edema.   Extremities: inspection of digits and nails wnl    Skin: The areas examined included scalp, face, neck, RUE, LUE, chest, abdomen, back, RLE, LLE, buttock  All was normal except for the following:    - On the trunk are multiple brown, <109mm, smooth bordered macules and papules.   - tan feathery macules in sun exposed areas  - On the lower back is a well  healed scar without nodularity or pigmentation  - Dyschromic skin with accentuation of skin lines in sunexposed areas      ASSESSMENT AND TREATMENT PLAN:  Erik Burke was seen today for recheck. Diagnoses and all orders for this visit:    Neoplasm of uncertain behavior/significance: lesion on ***   - Differential diagnosis includes ***  -- referral to Derm surg for eyelid biopsy    Acne vulgaris: comedonal. Well controlled today  - pathophysiology of acne discussed  - educated the patient on the treatment options for acne including topical benzoyl peroxide, topical benzoyl peroxide/antibiotics combo, topical retinoids, oral antibiotics, oral retinoids    -- cont tretinoin 0.025% cream qhs - Apply a pea sized amount to your entire face every night. You can mix this medication with moisturizer or apply less frequently if too drying or irritating.   Discussed retinoid SE of dryness, redness, increased sun sensitivity.   -- cont benzoyl peroxide wash daily   Discussed BPO SE of drying, redness, bleaching of clothes, towels and sheets, contact allergies.     Multiple melanocytic nevi  - Patient reassured  - Discussed ABCDEs of concerning nevi, self skin exams and encouraged to return with any new, changing or concerning lesions    Sun damage  - The nature of sun-induced photo-aging and skin cancers has been discussed.  Recommended sun avoidance, protective clothing/hats, regular use of at least 30-SPF sunscreens.    Lentigines  - Patient reassured of benign nature, no treatment indicated.    History of dysplastic nevi, Scar: site lower back, no e/o recurrence  - Patient was reassured and encouraged f/u for routine skin checks  - Discussed at length importance of sun protection, including avoidance of sun at peak hours (10 am-2 pm), protective clothing, and use of sunscreen with SPF 30 or greater with frequent reapplication (every 1 to 1.5 hours, or more frequently if in water or if heavy sweating).        - f/u 1 year  TBC      Lennox Pippins, MD.   [place] [facility] [place], [facility]  Department of Dermatology

## 2017-07-12 ENCOUNTER — Encounter (INDEPENDENT_AMBULATORY_CARE_PROVIDER_SITE_OTHER): Payer: Self-pay

## 2017-07-12 ENCOUNTER — Ambulatory Visit (INDEPENDENT_AMBULATORY_CARE_PROVIDER_SITE_OTHER): Payer: No Typology Code available for payment source | Admitting: Dermatology

## 2017-07-12 ENCOUNTER — Encounter (INDEPENDENT_AMBULATORY_CARE_PROVIDER_SITE_OTHER): Payer: Self-pay | Admitting: Dermatology

## 2017-07-12 DIAGNOSIS — I781 Nevus, non-neoplastic: Secondary | ICD-10-CM

## 2017-07-12 DIAGNOSIS — L853 Xerosis cutis: Secondary | ICD-10-CM

## 2017-07-12 DIAGNOSIS — D485 Neoplasm of uncertain behavior of skin: Principal | ICD-10-CM

## 2017-07-12 DIAGNOSIS — D1801 Hemangioma of skin and subcutaneous tissue: Secondary | ICD-10-CM

## 2017-07-12 DIAGNOSIS — L578 Other skin changes due to chronic exposure to nonionizing radiation: Secondary | ICD-10-CM

## 2017-07-12 NOTE — Progress Notes (Signed)
Primary MD: [name]  Consult Requested By: [name]    HISTORY:  Erik Burke is a 37 year old male with no history of skin cancer, does have a history of dysplastic nevi who presents for evaluation of skin changes given history of UV exposure, sun damage.     - LOV: 04/2017 for skin check    Today, patient notes lesion on the left upper eyelid, present for 2 months, not healing but not bleeding, growing, changing colors. Also notes red lesion on right nasal ala present for "months".  No growth, itching, bleeding, ulceration, pain. No prior treatment. Also interested in laser treatment for eyebrows. Has friends that get it done. Also has dry skin behind ears. Self resolved.     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    - The patient  has a past medical history of Acne, Back pain, Hyperhidrosis, Hyperlipidemia, and Parasomnia associated with sexual behavior.  - The medications were reviewed.  - Allergies: Atorvastatin; Pravastatin; Rosuvastatin; and Zetia [ezetimibe]    REVIEW OF SYSTEMS:  CONSTITUTIONAL: negative for fever or chills  DERMATOLOGIC: Feels well, no other skin complaints.    PHYSICAL EXAM:  Skin Type:                    2  General Appearance: within normal limits  Neuro: Alert and oriented x 3  Psych: Mood and affect within normal limits  Eyes: Inspection of lids and conjunctiva within normal limits  Cardiovascular: Inspection of peripheral vascular system showed no swelling, edema.   Extremities: inspection of digits and nails wnl    Skin: The areas examined included scalp, face, neck  All was normal except for the following:    - 1.5 mm scaly papule on the left  upper eyelid margin.    - mild scale of postauricular scalp, earlobes  - left nasal ala with 1 mm red papule  - tan feathery macules in sun exposed areas  - Dyschromic skin with accentuation of skin lines in sunexposed areas      ASSESSMENT AND TREATMENT PLAN:  Erik Burke was seen today for recheck. Diagnoses and all orders for this visit:    Neoplasm of uncertain behavior/significance: lesion on upper eyelid   - Differential diagnosis includes wart, sk AK vs BCC  -- referral to Opthalmology for eyelid biopsy    Angioma of nose, Laser Hair Removal, [place]   --discussed that laser hair removal is elective and not covered by insurance. Patient can schedule cosmetic consult with Dr Dorothey Baseman at the front desk to discuss removal of lesions. Discussed that this will incur an out of pocket charge.     Sun damage  - The nature of sun-induced photo-aging and skin cancers has been discussed.  Recommended sun avoidance, protective clothing/hats, regular use of at least 30-SPF sunscreens.    Lentigines  - Patient reassured of benign nature, no treatment indicated.    Xerosis cutis  - dry skin care and emollients reviewed      - f/u 1 year TBC      Erik Burke, [place] [facility] [place], [facility]  Department of Dermatology

## 2017-07-12 NOTE — Patient Instructions (Signed)
You have been referred to the Department of Ophthalmology  for a clinic consult. You may call the clinic directly to schedule your appointment. It is not necessary to wait for insurance authorization.     Ophthalmology Department scheduling phone numbers:    [place]: [phone]  [place]:   [phone]    We look forward to serving your Ophthalmology needs

## 2017-07-15 ENCOUNTER — Inpatient Hospital Stay (INDEPENDENT_AMBULATORY_CARE_PROVIDER_SITE_OTHER)
Admit: 2017-07-15 | Discharge: 2017-07-15 | Disposition: A | Discharge status: Home Health | Payer: No Typology Code available for payment source | Attending: Neurology | Admitting: Neurology

## 2017-07-15 ENCOUNTER — Other Ambulatory Visit (INDEPENDENT_AMBULATORY_CARE_PROVIDER_SITE_OTHER): Payer: No Typology Code available for payment source | Attending: Cardiovascular Disease

## 2017-07-15 DIAGNOSIS — R51 Headache: Principal | ICD-10-CM

## 2017-07-15 DIAGNOSIS — E782 Mixed hyperlipidemia: Principal | ICD-10-CM | POA: Insufficient documentation

## 2017-07-15 DIAGNOSIS — M533 Sacrococcygeal disorders, not elsewhere classified: Principal | ICD-10-CM

## 2017-07-15 DIAGNOSIS — Z9889 Other specified postprocedural states: Secondary | ICD-10-CM

## 2017-07-15 DIAGNOSIS — H9311 Tinnitus, right ear: Secondary | ICD-10-CM

## 2017-07-15 DIAGNOSIS — R519 Headache, unspecified: Secondary | ICD-10-CM

## 2017-07-15 LAB — COMPREHENSIVE METABOLIC PANEL, BLOOD
ALT (SGPT): 20 U/L (ref 0–41)
AST (SGOT): 22 U/L (ref 0–40)
Albumin: 4.6 g/dL (ref 3.5–5.2)
Alkaline Phos: 64 U/L (ref 40–129)
Anion Gap: 12 mmol/L (ref 7–15)
BUN: 14 mg/dL (ref 6–20)
Bicarbonate: 24 mmol/L (ref 22–29)
Bilirubin, Tot: 0.54 mg/dL (ref <1.2)
Calcium: 9.7 mg/dL (ref 8.5–10.6)
Chloride: 101 mmol/L (ref 98–107)
Creatinine: 1.04 mg/dL (ref 0.67–1.17)
GFR: >60 mL/min
Glucose: 91 mg/dL (ref 70–99)
Potassium: 4.5 mmol/L (ref 3.5–5.1)
Sodium: 137 mmol/L (ref 136–145)
Total Protein: 7.4 g/dL (ref 6.0–8.0)

## 2017-07-15 LAB — LIPID(CHOL FRACT) PANEL, BLOOD
Cholesterol: 163 mg/dL (ref <200)
HDL-Cholesterol: 54 mg/dL
LDL-Chol (Calc): 88 mg/dL (ref <160)
Non-HDL Cholesterol: 109 mg/dL
Triglycerides: 105 mg/dL (ref 10–170)

## 2017-07-15 MED ORDER — GADOBUTROL 1 MMOL/ML IV SOLN
8.0000 mL | Freq: Once | INTRAVENOUS | Status: AC
Start: 2017-07-15 — End: 2017-07-15
  Administered 2017-07-15: 8 mL via INTRAVENOUS

## 2017-07-15 NOTE — Interdisciplinary (Signed)
Blood drawn from right arm with 21 gauge needle. 1 tubes taken.   Patient identity authenticated by Monique L Moran.

## 2017-07-18 ENCOUNTER — Encounter (HOSPITAL_COMMUNITY): Payer: Self-pay | Admitting: Cardiovascular Disease

## 2017-07-19 NOTE — Telephone Encounter (Signed)
Patient had lipid panel and CMP on 07/15/17.    Darrall Dears RN, Clinic Nurse   [place]

## 2017-07-19 NOTE — Telephone Encounter (Signed)
From: [name]  To: Sherron Monday., MD  Sent: 07/18/2017 9:36 PM PDT  Subject: 1-Non Urgent Medical Advice    Hi Dr. Cherylann Ratel, following up about your thoughts on the recent lab results.     Thanks!  [name]

## 2017-07-23 ENCOUNTER — Encounter (HOSPITAL_BASED_OUTPATIENT_CLINIC_OR_DEPARTMENT_OTHER): Payer: Self-pay | Admitting: Hospital

## 2017-07-23 ENCOUNTER — Other Ambulatory Visit: Payer: Self-pay

## 2017-07-24 ENCOUNTER — Other Ambulatory Visit
Payer: No Typology Code available for payment source | Attending: Anatomic Pathology & Clinical Pathology | Admitting: Ophthalmology

## 2017-07-24 ENCOUNTER — Encounter (INDEPENDENT_AMBULATORY_CARE_PROVIDER_SITE_OTHER): Payer: Self-pay | Admitting: Ophthalmology

## 2017-07-24 DIAGNOSIS — H01009 Unspecified blepharitis unspecified eye, unspecified eyelid: Secondary | ICD-10-CM

## 2017-07-24 DIAGNOSIS — D485 Neoplasm of uncertain behavior of skin: Principal | ICD-10-CM

## 2017-07-24 DIAGNOSIS — H029 Unspecified disorder of eyelid: Secondary | ICD-10-CM

## 2017-07-24 DIAGNOSIS — H01004 Unspecified blepharitis left upper eyelid: Secondary | ICD-10-CM

## 2017-07-24 DIAGNOSIS — D23121 Other benign neoplasm of skin of left upper eyelid, including canthus: Secondary | ICD-10-CM

## 2017-07-24 MED ORDER — NEOMYCIN-POLYMYXIN-DEXAMETH 3.5-10000-0.1 OP OINT
1.00 | TOPICAL_OINTMENT | Freq: Two times a day (BID) | OPHTHALMIC | 0 refills | Status: AC
Start: 2017-07-24 — End: 2017-07-31

## 2017-07-24 NOTE — Progress Notes (Signed)
General: patient alert and oriented x 3    Pupils: no APD OU    EOM: full OU, no diplopia    EXTERNAL EYELID EXAM:  LIDS: LUL mass, elevated, +crusting. No telangiectasias, no madarosis.    MRD1: 4, 4  MFD: 5, 5  Lagophthalmos: none OU      ASSESSMENT / PLAN:  1. LUL eyelid mass  - patient has changing in appearance, crusting    - Recommend excision and biopsy    - The risks, benefits, and alternatives of surgery were discussed with the patient including but not limited pain, infection, bleeding, scar, eyelid malposition, lagophthalmos, ptosis, need for additional surgery,  and unexpected refractive changes requiring possible future surgery or contact lenses. The patient expressed full understanding of these risks and wanted to proceed with the surgery.     - RTC 2-3 weeks or PRN worsening eye symptoms    I reviewed and confirmed the above HPI.  I reviewed and confirmed the techs review of systems, past histories, and chief complaint.    I saw and examined the patient, and discussed the case with the resident. I confirmed the examination findings. The assessment and plan are my own. The final examination findings, image interpretations, and plan as documented in the record represent my personal judgement and conclusions.

## 2017-07-30 ENCOUNTER — Encounter (HOSPITAL_COMMUNITY): Payer: Self-pay | Admitting: Cardiovascular Disease

## 2017-07-30 NOTE — Progress Notes (Signed)
Much improved cholesterol!  Thank you!  Charline Bills, MD  Assistant Professor of Medicine  Division of Cardiovascular Medicine

## 2017-08-01 ENCOUNTER — Encounter (INDEPENDENT_AMBULATORY_CARE_PROVIDER_SITE_OTHER): Payer: Self-pay | Admitting: Neurology

## 2017-08-01 ENCOUNTER — Other Ambulatory Visit: Payer: Self-pay

## 2017-08-01 ENCOUNTER — Ambulatory Visit (INDEPENDENT_AMBULATORY_CARE_PROVIDER_SITE_OTHER): Payer: No Typology Code available for payment source | Admitting: Neurology

## 2017-08-01 ENCOUNTER — Encounter (INDEPENDENT_AMBULATORY_CARE_PROVIDER_SITE_OTHER): Payer: Self-pay

## 2017-08-01 VITALS — BP 129/77 | HR 86 | Temp 98.0°F | Resp 18 | Ht 68.0 in | Wt 170.0 lb

## 2017-08-01 DIAGNOSIS — M533 Sacrococcygeal disorders, not elsewhere classified: Principal | ICD-10-CM

## 2017-08-01 DIAGNOSIS — H579 Unspecified disorder of eye and adnexa: Secondary | ICD-10-CM

## 2017-08-01 NOTE — Progress Notes (Signed)
[facility] PROGRESS NOTE    Demographics:  Patient Name: Erik Burke  Medical Record #: [id number]  DOB: 1981/03/12  Age: 37 year old  Sex: male  ------------------------------------------------------------------------------------------------------    Chief complaint:  Sacral pain ;     History was obtained directly from the patient.    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    alirocumab (PRALUENT) 75 MG/ML SOPN injection pen INJECT 1 ML (75 MG) UNDER THE SKIN EVERY 14 DAYS.    clonazePAM (KLONOPIN) 0.5 MG tablet Take 0.5 tablets (0.25 mg) by mouth at bedtime.    Evolocumab 140 MG/ML SOAJ INJECT 140 MG UNDER THE SKIN EVERY 14 DAYS.    guaiFENesin (MUCINEX) 600 MG Sustained-Release tablet Take 2 tablets (1,200 mg) by mouth 2 times daily.    oxymetazoline (AFRIN) 0.05 % nasal spray Spray 2 sprays into each nostril 2 times daily. Recommend limiting use to three days only    propranolol (INDERAL LA)  60 MG CP24 Take 1 capsule (60 mg) by mouth daily.    pseudoePHEDrine (SUDAFED 12 HOUR) 120 MG Controlled-Release tablet Take 1 tablet (120 mg) by mouth 2 times daily as needed for Congestion.    rosuvastatin (CRESTOR) 5 MG tablet Take 0.5 tablets (2.5 mg) by mouth three times a week.    tretinoin (RETIN-A) 0.025 % cream Use a pea sized amount on the entire face nightly.  At first, you may use every other night to avoid dryness and irritation, and gradually increase use to nightly as tolerated     No current facility-administered medications for this visit.            Review of Systems:    Constitutional:  negative for fever, chills, night sweats; otherwise negative.   Eyes:  negative for redness and tearing, denies double vision;    Ears, Nose, Mouth, Throat:  negative for sore throat, hearing loss, dental problems;    Cardiovascular:  negative for chest pain, orthopnea; lower extremity edema;   Respiratory:  negative for cough, shortness of breath, wheezing; .   GI:  positive for nausea; negative for constipation;   GU:  negative.   Musculoskeletal:  +  for Back/ joint and muscle pain.    Endocrine:  negative.   Psychiatric:  positive for anxiety; otherwise negative.   Neurologic:  Negative     Physical Examination  BP 129/77 (BP Location: Right arm, BP Patient Position: Sitting, BP cuff size: Regular)    Pulse  86    Temp 98 F (36.7 C) (Oral)    Resp 18    Ht 5\' 8"  (1.727 m)    Wt 77.1 kg (170 lb)    SpO2 100%    BMI 25.85 kg/m     Head and Neck  Head is normocephalic.   Tenderness is present in the following area(s):      ENT examination:       Neurological Exam  Mental status:  Alert and oriented.  Language:  Fluency normal, comprehension normal, repetition normal, naming normal, reading normal, writing normal.  Speech:  Normal phonation and articulation.    Cranial Nerves  CN I:  Not tested  CN II:  Both pupils are symmetric and equally reactive to light.  On fundoscopic examination, normal  optic disc OD and OS.  No evidence of increased intracranial pressure.  Peripheral vision is intact.  CN III, IV, IX:  Extraocular movements are intact.  No ptosis.  No pathological nystagmus.  No APD.  CN V:  Normal light touch in V1, V2, and V3 distributions.   CN VII:  Symmetrical nasolabial folds and forehead wrinkles; normal orbicularis oculi strength bilaterally.  CN VIII:  Normal bedside hearing exam.  CN IX and X:  Palatal movements are full and symmetric.  Uvula is in midline.  CN XI:  Normal sternoclaidomastoid strength; normal shoulder shrug.  CN XII:  No tongue deviation; no tongue weakness.    Motor Examination  -  Muscle bulk:  Normal.  - Strength:  Strength is 5/5 in all muscle groups.  -------------------------------------------------------------------------    Sensory Examination:  normal in all modalities (light touch, pain/pinprick/temperature, and vibration/position).  ---------------------------------------------------------------------    Gait:  steady without evidence of ataxia; normal toe walking and heel walking; normal tandem gait.    Coordination:  Finger tapping is normal    Psychiatric Examination  Mood:  stable.    Assessment and Plan    1. Sacral pain     Today I just reviewed patient's MRI with the patient. I told him that his complain is not part of my subspecialty and I recommended that he follows up with pain clinic for possible interventional treatment:    - Consult to [facility] - Internal; Future  - Consult/Referral to Pain Clinic    2. Eye pressure   He is going to follow up with ophthalmologist.  The possibility of a uveitis needs to be checked by ophthalmologist    3. 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  I discussed importance of having regular follow-up visits.  Also if any side effect with medication happens patient needs to contact the prescribing physician before stop The medication .       Chip Saras, MD,FAAN, [facility]  Neurologist/Headache Specialist

## 2017-08-01 NOTE — Telephone Encounter (Signed)
From: [name]  To: Sherron Monday., MD  Sent: 07/30/2017 10:58 PM PDT  Subject: 1-Non Urgent Medical Advice    I have a question about LIPID(CHOL FRACT) PANEL, BLOOD resulted on 07/15/17, 20:09.    Hi Dr. Cherylann Ratel,    Sounds good about the results. Any need to take the rosuvastatin concurrent with praluent? (It appears not, but wanted to check). Also, should I get a longer-term lipid panel follow-up at any point?    Thanks,  [name]

## 2017-08-02 ENCOUNTER — Telehealth (HOSPITAL_BASED_OUTPATIENT_CLINIC_OR_DEPARTMENT_OTHER): Payer: Self-pay | Admitting: Pain Medicine

## 2017-08-02 ENCOUNTER — Other Ambulatory Visit: Payer: Self-pay

## 2017-08-02 ENCOUNTER — Telehealth (INDEPENDENT_AMBULATORY_CARE_PROVIDER_SITE_OTHER): Payer: Self-pay | Admitting: Ophthalmology

## 2017-08-02 ENCOUNTER — Encounter (HOSPITAL_BASED_OUTPATIENT_CLINIC_OR_DEPARTMENT_OTHER): Payer: Self-pay

## 2017-08-02 NOTE — Telephone Encounter (Signed)
I called Erik Burke back about the results of his recent left upper eyelid biopsy, which showed actinic keratosis. Per discussion with the pathologist, the lesion was entirely excised.  He is healing well and has a return appointment to see me next week.  I encouraged him to continue his regular follow up with Dr. Overton Mam from dermatology.      Hanley Hays. Oleta Mouse MD PhD  Oculoplastics

## 2017-08-02 NOTE — Telephone Encounter (Signed)
Patient is calling to schedule a consultation with Dr Thereasa Parkin. He is an established patient of Dr Trinna Post, but is being referred by Dr Ansari(referral ordered 08/01/17) for Pelvic Pain. Patient had Brain and Lumbar Spine MRIs(completed on 07/15/17) and believes his pelvic pain is in relation to the results of both. Patient is not requesting to reestablish care with Dr Thereasa Parkin, and only wants a consultation with him to discuss the new issue. He is requesting a call back at ph#[phone]. Pt declined offer to schedule with Dr Trinna Post because he would like to see a Pain Neurologist. Please advise.

## 2017-08-03 ENCOUNTER — Encounter (INDEPENDENT_AMBULATORY_CARE_PROVIDER_SITE_OTHER): Payer: Self-pay | Admitting: Neurology

## 2017-08-05 ENCOUNTER — Encounter (INDEPENDENT_AMBULATORY_CARE_PROVIDER_SITE_OTHER): Payer: Self-pay

## 2017-08-06 NOTE — Telephone Encounter (Signed)
Spoke with patient and was able to make him an appointment as a 2nd  Opinion and was aware it was a one time visit as a 2nd opinion

## 2017-08-07 ENCOUNTER — Encounter (INDEPENDENT_AMBULATORY_CARE_PROVIDER_SITE_OTHER): Payer: Self-pay | Admitting: Ophthalmology

## 2017-08-07 ENCOUNTER — Telehealth (HOSPITAL_COMMUNITY): Payer: Self-pay | Admitting: Cardiovascular Disease

## 2017-08-07 ENCOUNTER — Ambulatory Visit (INDEPENDENT_AMBULATORY_CARE_PROVIDER_SITE_OTHER): Payer: No Typology Code available for payment source | Admitting: Ophthalmology

## 2017-08-07 ENCOUNTER — Other Ambulatory Visit: Payer: Self-pay

## 2017-08-07 DIAGNOSIS — L570 Actinic keratosis: Secondary | ICD-10-CM

## 2017-08-07 DIAGNOSIS — Z9889 Other specified postprocedural states: Secondary | ICD-10-CM

## 2017-08-07 DIAGNOSIS — E782 Mixed hyperlipidemia: Secondary | ICD-10-CM

## 2017-08-07 NOTE — Progress Notes (Signed)
General: patient alert and oriented x 3    Pupils: no APD OU    EOM: full OU, no diplopia    EXTERNAL EYELID EXAM:  LIDS: well healed excisional biopsy    MRD1: 4, 4  MFD: 5, 5  Lagophthalmos: none OU    Pathology:  A: Left upper eyelid, biopsy:  -Actinic keratosis.    ASSESSMENT / PLAN:  1. LUL Actinic Keratosis  - well healed  - recommend follow up as scheduled with Dermatology  - follow up PRN

## 2017-08-07 NOTE — Telephone Encounter (Signed)
I have a question about LIPID(CHOL FRACT) PANEL, BLOOD resulted on 07/15/17, 20:09.     Hi Dr. Cherylann Ratel,     Sounds good about the results. Any need to take the rosuvastatin concurrent with praluent? (It appears not, but wanted to check). Also, should I get a longer-term lipid panel follow-up at any point?     Thanks,   [name]

## 2017-08-07 NOTE — Progress Notes (Signed)
General: patient alert and oriented x 3    Pupils: no APD OU    EOM: full OU, no diplopia    EXTERNAL EYELID EXAM:  LIDS: well healed excisional biopsy    MRD1: 4, 4  MFD: 5, 5  Lagophthalmos: none OU    Pathology:  A: Left upper eyelid, biopsy:  -Actinic keratosis.    ASSESSMENT / PLAN:  1. LUL Actinic Keratosis  - well healed  - recommend follow up as scheduled with Dermatology  - photos taken today for  Future reference  - follow up PRN      I saw and examined the patient, and discussed the case with the resident. I confirmed the examination findings. The assessment and plan are my own. The final examination findings, image interpretations, and plan as documented in the record represent my personal judgement and conclusions.    I reviewed and confirmed the above HPI.  I reviewed and confirmed the techs review of systems, past histories, and chief complaint.

## 2017-08-08 NOTE — Telephone Encounter (Signed)
mychart    No need to take rosuvastatin, I assume this recent blood work was off rosuvastatin, correct? Let's repeat labs few days prior to your next visit with me. Orders are in the system.  Thanks!  Charline Bills, MD  Assistant Professor of Medicine  Division of Cardiovascular Medicine  [email or web address]

## 2017-08-20 ENCOUNTER — Other Ambulatory Visit: Payer: Self-pay

## 2017-08-21 ENCOUNTER — Encounter (HOSPITAL_BASED_OUTPATIENT_CLINIC_OR_DEPARTMENT_OTHER): Payer: Self-pay | Admitting: Pain Medicine

## 2017-08-21 ENCOUNTER — Ambulatory Visit: Payer: No Typology Code available for payment source | Attending: Pain Medicine | Admitting: Pain Medicine

## 2017-08-21 VITALS — BP 130/77 | HR 85 | Temp 98.2°F

## 2017-08-21 DIAGNOSIS — M5418 Radiculopathy, sacral and sacrococcygeal region: Secondary | ICD-10-CM | POA: Insufficient documentation

## 2017-08-21 DIAGNOSIS — G96810 Intracranial hypotension, unspecified: Secondary | ICD-10-CM

## 2017-08-21 DIAGNOSIS — J634 Siderosis: Secondary | ICD-10-CM | POA: Insufficient documentation

## 2017-08-21 DIAGNOSIS — M961 Postlaminectomy syndrome, not elsewhere classified: Secondary | ICD-10-CM | POA: Insufficient documentation

## 2017-08-21 DIAGNOSIS — G9009 Other idiopathic peripheral autonomic neuropathy: Secondary | ICD-10-CM | POA: Insufficient documentation

## 2017-08-21 DIAGNOSIS — M5416 Radiculopathy, lumbar region: Secondary | ICD-10-CM | POA: Insufficient documentation

## 2017-08-21 DIAGNOSIS — G9389 Other specified disorders of brain: Secondary | ICD-10-CM | POA: Insufficient documentation

## 2017-08-21 NOTE — Progress Notes (Addendum)
PAIN CLINIC FOLLOW UP NOTE    Primary Care Physician [name]    SUBJECTIVE:  This is a 37 year old male with a chief complaint of pain and pressure pain behind his eyes. He was referred by Dr. Latricia Heft; he also sees Dr. Trinna Post. He states that he first started having these symptoms about 18 months ago. In 2012 he started having a sensation in his forehead that felt like glass in his skull. He was having orthostatic headaches at that time. In February 2013 he had a myelogram that showed he had multiple areas of dural tears. He then received a blood patch and fibrin glue patches that did subsequently help. He was seen to have worsening herniated discs which he got some ESIs that were helpful for about 3-4 months. He then had laminectomy and discectomy in 2014 that did significantly help his pain. His current symptoms feel like pressure behind the eyes, ears and back of the neck. When he closes his eyes he endorses seeing rings of light. He states that these things progress as the day goes on. He states that his symptoms now are different than when he was having intracranial hypotension. The pressure he gets he states will get worse after sitting for more than 90 minutes. He does endorse that he does get some relief with lying on his side. He says he will get distorted vision and nausea with these symptoms but no real photophobia and phonophobia. He also endorses having some coccygeal pain with occasional perineal pain that is associated with shooting pains down his RLE that also is associated with some numbness.    Current Pain Description  Patient stated their pain score today is  5/10. Patient reports 10% improvement in their pain with use of their current pain medications. On the pain diagram patient shades in their forehead, sacral low back and right postero-lateral leg. They describe their pain as pressure, pulsing, aching, throbbing, radiating, cramping, burning, shooting and dull. The pain is associated  with numbness, weakness, pins and needles, and muscle spasms. The pain makes it hard for him to walk, sit, work and be with family. The pain is worse in the afternoon, evening, late afternoon and late evening. He controls his flares with heat, exercise and positioning.    Patient's highest pain level in the last week is 9/10. Patient's lowest pain level in the past week is 1/10. Patient's average pain level in the last week is: 5/10.    Pain Treatments  Previous pain procedures and response include:   - LESIs (somewhat helpful)    Patient is currently using the following pain medications:   - acetazolamide 250mg  daily PRN, clonazepam 0.5mg  PO HS, propranolol 60mg  daily    Current Opioid Use: no       Past Medical History:   Diagnosis Date    Acne     Back pain     secondary to herniated disc, from back injury in 2000    Hyperhidrosis     propanolol tx    Hyperlipidemia     LDL > 200, TG ~400 at Dx, statin intolerant.  Per pt, r/o for familial HDL    Parasomnia associated with sexual behavior     Tx with Caprice Renshaw, has been being downtitrated     Past Surgical History:   Procedure Laterality Date    epidural steroid injection      x5 ESIs, most ecent 2017    l3/4 laminectomy  2014    @  [facility], 2014     Current Outpatient Medications   Medication Sig Dispense Refill    acetaZOLAMIDE (DIAMOX) 250 MG tablet Take 1 tablet (250 mg) by mouth daily as needed (headache).      alirocumab (PRALUENT) 75 MG/ML SOPN injection pen INJECT 1 ML (75 MG) UNDER THE SKIN EVERY 14 DAYS. 2 mL 3    clonazePAM (KLONOPIN) 0.5 MG tablet Take 0.5 tablets (0.25 mg) by mouth at bedtime. 15 tablet 0    Evolocumab 140 MG/ML SOAJ INJECT 140 MG UNDER THE SKIN EVERY 14 DAYS. 2 mL 3    guaiFENesin (MUCINEX) 600 MG Sustained-Release tablet Take 2 tablets (1,200 mg) by mouth 2 times daily. 30 tablet 0    oxymetazoline (AFRIN) 0.05 % nasal spray Spray 2 sprays into each nostril 2 times daily. Recommend limiting use to three days only 1 bottle 0     propranolol (INDERAL LA) 60 MG CP24 Take 1 capsule (60 mg) by mouth daily. 90 each 3    pseudoePHEDrine (SUDAFED 12 HOUR) 120 MG Controlled-Release tablet Take 1 tablet (120 mg) by mouth 2 times daily as needed for Congestion. 20 tablet 0    rosuvastatin (CRESTOR) 5 MG tablet Take 0.5 tablets (2.5 mg) by mouth three times a week. 30 tablet 1    tretinoin (RETIN-A) 0.025 % cream Use a pea sized amount on the entire face nightly.  At first, you may use every other night to avoid dryness and irritation, and gradually increase use to nightly as tolerated 45 g 3     No current facility-administered medications for this visit.      Atorvastatin; Pravastatin; Rosuvastatin; and Zetia [ezetimibe]  Social History     Socioeconomic History    Marital status: Married     Spouse name: Not on file    Number of children: Not on file    Years of education: Not on file    Highest education level: Not on file   Occupational History    Not on file   Social Needs    Financial resource strain: Not on file    Food insecurity:     Worry: Not on file     Inability: Not on file    Transportation needs:     Medical: Not on file     Non-medical: Not on file   Tobacco Use    Smoking status: Never Smoker    Smokeless tobacco: Never Used   Substance and Sexual Activity    Alcohol use: Yes     Comment: 1-3 drinks infrequently    Drug use: Not on file    Sexual activity: Not on file   Lifestyle    Physical activity:     Days per week: Not on file     Minutes per session: Not on file    Stress: Not on file   Relationships    Social connections:     Talks on phone: Not on file     Gets together: Not on file     Attends religious service: Not on file     Active member of club or organization: Not on file     Attends meetings of clubs or organizations: Not on file     Relationship status: Not on file    Intimate partner violence:     Fear of current or ex partner: Not on file     Emotionally abused: Not on file     Physically  abused: Not on file  Forced sexual activity: Not on file   Other Topics Concern    Not on file   Social History Narrative    PhD in Psychology, works in [employer], [place] [employer]     Family History   Problem Relation Name Age of Onset    Cancer Mother          cervical    No Known Problems Father      Cancer Maternal Grandmother          ovarian    No Known Cancer Maternal Grandfather      Heart Disease Maternal Grandfather      Cancer Paternal Grandmother          pancreatic    Cancer Paternal Grandfather          mesothelioma    Cancer Brother  17        osteosarcoma       Review of Systems   General: Negative  Cardiovascular: Negative  Gastrointestinal: Nausea  Genito/Reproductive: Negative  Endocrine: Negative  Psychiatry: Negative  EENT: Blurred vision  Respiratory: Negative  Urinary: Negative  Musculoskeletal: Negative  Skin: Negative  Neuro: negative  Remainder of Systems Negative    MRI L Spine (07/15/17):  IMPRESSION:  1. Interval postoperative changes compatible with discectomy and posterior decompression at L2-L3 with marked improvement of the prior spinal canal stenosis at this level. No focal collection or specific evidence of leak is identified.    2. Interval subtle smooth linear signal abnormality/T2 hypointensity involving the caudal aspect of the thecal sac with subtle enhancement, which may be associated with siderosis as well subtle reactive change, given prior intervention and concurrent MRI brain findings.    3. Degenerative changes of the lumbar spine at L3-L4 and L4-L5 with up to moderate spinal canal stenosis at L3-L4 and moderate bilateral neural foraminal stenosis at L3-L4 and L4-L5.    OBJECTIVE:  Physical Exam  Vitals: BP 130/77 (BP Location: Right arm, BP Patient Position: Sitting, BP cuff size: Large)    Pulse 85    Temp 98.2 F (36.8 C) (Oral)   General: healthy, no distress, cooperative  Mental Status:  alert and awake, oriented. Speech is clear/ normal  Affect:  euthymic  Skin:  No rashes or bruises.  HEENT:  Pupils equal, not pinpoint.  Pulmonary:  Breathing easily without tachypnea or bradypnea.  Cardiac:  No LE edema.  Abdomen:  Not distended.  Ambulation: Pt is able to raise from a seated position without difficulty. Gait is not antalgic and the patient ambulates without assistance.     Lower-Extremities  Hips  (flex 100 ext 30 ab 40 ad 20 ir 40 er 45): full without pain  Knees  (flex 130): full without pain  Trochanteric bursa:  Right non-tender; Left non-tender  Motor Strength  Iliopsoas:  5 symmetric  Quadriceps:  5 symmetric  Hamstrings:    5 symmetric  Ankle Dorsi-flexion:   5 symmetric  Ankle Planar-flexion:  5 symmetric  Ext-hallucis longus 5 symmetric  Reflexes  Patella:    Bilateral 2+  Achilles:  Bilateral 2+  Sensory Exam  Light touch: intact  Allodynia: absent    Neuro:  MS: Awake, alert, fluent speech, memory intact  CN: II-XII intact  Motor: 5/5 throughout BUE and BLE  Reflexes: 2+ throughout B/TR/BR/P/A  Coord: FNF intact  Sens: Romberg negative, LT intact x4 ext  Gait: Normal casual, toes, heels and tandem    MRI Brain 07/15/17:  IMPRESSION:  1. Compared to outside MRI brain dated 08/15/2011, interval development of superficial siderosis most pronounced along cerebellum and subtly along the paramedian-posterior occipital lobes. This could be related to prior history of CSF leak, prior intraspinal hemorrhage and/or prior blood patches with cranial redistribution of associated blood products. This can be correlated with additional prior imaging if available and timing of prior blood patches. There is no evidence subdural hygroma, abnormal dural enhancement or brain sagging to strongly suggest intracranial hypotension on the current study.    2. No evidence of acute infarction. No midline shift, herniation or hydrocephalus.    MRI L-spine 07/15/17:  IMPRESSION:  1. Interval postoperative changes compatible with discectomy and posterior decompression at  L2-L3 with marked improvement of the prior spinal canal stenosis at this level. No focal collection or specific evidence of leak is identified.    2. Interval subtle smooth linear signal abnormality/T2 hypointensity involving the caudal aspect of the thecal sac with subtle enhancement, which may be associated with siderosis as well subtle reactive change, given prior intervention and concurrent MRI brain findings.    3. Degenerative changes of the lumbar spine at L3-L4 and L4-L5 with up to moderate spinal canal stenosis at L3-L4 and moderate bilateral neural foraminal stenosis at L3-L4 and L4-L5.    ASSESSMENT  Encounter Diagnoses   Name Primary?    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 He has atypical head pressure (denies pain). He has a complex history  including prior laminectomy and discectomy, intracranial hypotension, dural tears with CSF leak, chronic sacrum/coccyx pain of uncertain etiology and lumbar radiculopathy. The patient has had previous benefit from ESIs but no real relief with ganglion impar. He has had care with Dr. Lorelle Gibbs from [facility] as well as Dr. Trinna Post and Dr. Latricia Heft.    While he has history of low CSF headache, his headaches are not definitely positional at this time, and his MRI brain 07/15/17 didn't show stigmata of intracranial hypotension, although it shows superficial siderosis likely 2/2 past intracranial hypotension, less likely 2/2 prior blood patches. The patient is interested in further evaluation of possible CSF leaks and possible blood patches or fibrin glue patches to treat them if found. However, there are many possible  diagnostic procedures that could be performed, ranging from virtual myelography to nuclear medicine and conventional myelography. I messaged Dr. Pearline Cables to discuss this.    Alternatively, his head pressure may not be 2/2 intracranial hypotension, and both patients with PDPH as well as primary headache disorders may respond to SPG blocks or ONBs. Discussed this with the patient.    Discussed that his leg and sacral/coccygeal pain and numbness may be related to post-laminectomy pain syndrome versus siderosis seen on his MRI L-spine, which shows no e/o nerve root compression. Introduced SCS trial as possible treatment for that, although coverage of this region can be difficult to achieve. Caudal ESI may also be considered.    Also introduced that gabapentinoids, TCAs, and SNRIs may be helpful for both/either his head pressure and/or sacral/coccygeal and leg pain, although he was hesitant about medications.    PLAN  Erik Burke was  seen today for pain.    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)    Post laminectomy syndrome  -     Case Request: PAIN ESI CAUDAL WITH IMAGING; Standing  -     dexamethasone (DECADRON) PF injection 1-10 mg  -     iohexol (OMNIPAQUE 240) 240 MG/ML solution 1-10 mL    Lumbar radiculopathy, chronic  -     Case Request: PAIN ESI CAUDAL WITH IMAGING; Standing  -     dexamethasone (DECADRON) PF injection 1-10 mg  -     iohexol (OMNIPAQUE 240) 240 MG/ML solution 1-10 mL    Sacral radiculopathy  -     Case Request: PAIN ESI CAUDAL WITH IMAGING; Standing  -     dexamethasone (DECADRON) PF injection 1-10 mg  -     iohexol (OMNIPAQUE 240) 240 MG/ML solution 1-10 mL    Sphenopalatine ganglion neuralgia  -     Case Request: Sphenopalatine ganglion block with  Tx360, 20 min, no IV, no NPO, no imaging; Standing  -     bupivacaine 0.5 % PF injection 1-10 mL    Other orders  -     VITAL SIGNS; Standing  -     Nursing Misc Order: Verify Consent for Procedure; Standing  -     Glucose (POC); Standing  -     Nursing Misc Order: Anticoagulation Status; Standing  -     Prothrombin Time, Blood Blue; Standing  -     INR & Protime (POCT); Standing  -     Oxygen Non-Protocol; Standing  -     Discharge Patient from Procedural Area; Standing  -     VITAL SIGNS; Standing  -     Nursing Misc Order: Verify Consent for Procedure; Standing  -     Glucose (POC); Standing  -     Nursing Misc Order: Anticoagulation Status; Standing  -     Prothrombin Time, Blood Blue; Standing  -     INR & Protime (POCT); Standing  -     Oxygen Non-Protocol; Standing  -     Discharge Patient from Procedural Area; Standing    Messaged Dr. Pearline Cables to discuss possible diagnostics with her for spontaneous CSF leaks    Will offer SPG blocks as treatment for sustpected headache 2/2 intracranial hypotension, order placed    Will offer caudal epidural steroid injection for lumbosacral radiculopathies suspect 2/2 siderosis of thecal sac as seen on MRI causing leg and sacral/coccygeal pain not otherwise explained    Medication recommendations include gabapentinoid, TCA, and/or SNRI trial for both head pressure and sacral/coccygeal/leg pain.    Follow-up:  - After above    [name], MD  [place] [facility] Fellow

## 2017-08-22 ENCOUNTER — Encounter (HOSPITAL_BASED_OUTPATIENT_CLINIC_OR_DEPARTMENT_OTHER): Payer: Self-pay | Admitting: Pain Medicine

## 2017-08-22 NOTE — Progress Notes (Addendum)
Attending Note:     Subjective:   I reviewed the history.   Patient interviewed and examined.   History of present illness (HPI): This is a 37 year old male who has a past medical history of Acne, Back pain, Hyperhidrosis, Hyperlipidemia, and Parasomnia associated with sexual behavior. he is here today with chronic head "pressure" (denies pain) and sacral/coccygeal and R leg pain here for 2nd opinion; sees Dr. Trinna Post and Dr. Latricia Heft.  Review of Systems (ROS): As per the fellow physician note.   Past Medical, Family, Social History: As per the fellow physician note.   Objective:   I have examined the patient and I concur with the fellow physician exam as documented.   Assessment and plan reviewed with the fellow physician.   I agree with the fellow physician plan as documented.     Encounter Diagnoses   Name Primary?    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)    Post laminectomy syndrome  -     Case Request: PAIN ESI CAUDAL WITH IMAGING; Standing  -     dexamethasone (DECADRON) PF injection 1-10 mg  -     iohexol (OMNIPAQUE 240) 240 MG/ML solution 1-10 mL    Lumbar radiculopathy, chronic  -     Case Request: PAIN ESI CAUDAL WITH IMAGING; Standing  -     dexamethasone (DECADRON) PF injection 1-10 mg  -     iohexol (OMNIPAQUE 240) 240 MG/ML solution 1-10 mL    Sacral radiculopathy  -     Case Request: PAIN ESI CAUDAL WITH IMAGING; Standing  -     dexamethasone (DECADRON) PF injection 1-10 mg  -      iohexol (OMNIPAQUE 240) 240 MG/ML solution 1-10 mL    Sphenopalatine ganglion neuralgia  -     Case Request: Sphenopalatine ganglion block with Tx360, 20 min, no IV, no NPO, no imaging; Standing  -     bupivacaine 0.5 % PF injection 1-10 mL    Other orders  -     VITAL SIGNS; Standing  -     Nursing Misc Order: Verify Consent for Procedure; Standing  -     Glucose (POC); Standing  -     Nursing Misc Order: Anticoagulation Status; Standing  -     Prothrombin Time, Blood Blue; Standing  -     INR & Protime (POCT); Standing  -     Oxygen Non-Protocol; Standing  -     Discharge Patient from Procedural Area; Standing  -     VITAL SIGNS; Standing  -     Nursing Misc Order: Verify Consent for Procedure; Standing  -     Glucose (POC); Standing  -     Nursing Misc Order: Anticoagulation Status; Standing  -     Prothrombin Time, Blood Blue; Standing  -  INR & Protime (POCT); Standing  -     Oxygen Non-Protocol; Standing  -     Discharge Patient from Procedural Area; Standing    I have examined the patient, discussed the findings, reviewed the plan, and answered all questions with the patient.  See the fellow physician note for further details.    Peter Minium, MD   Pain Neurologist  [facility]  Department of Anesthesiology  [place]    More than 40 min was spent with the patient today, of which more than 50% was spent in counseling regarding the diagnosis, prognosis, further diagnostic and therapeutic options and coordinating this care.  This included discussions of possible pathophysiology, prognosis of SIH, treatments with evidence for PDPH such as ONB and SPG block that may be helpful for him, and medication options. There were no barriers to learning.

## 2017-08-23 ENCOUNTER — Encounter (HOSPITAL_BASED_OUTPATIENT_CLINIC_OR_DEPARTMENT_OTHER): Payer: Self-pay | Admitting: Pain Medicine

## 2017-08-23 DIAGNOSIS — M5418 Radiculopathy, sacral and sacrococcygeal region: Secondary | ICD-10-CM | POA: Insufficient documentation

## 2017-08-23 DIAGNOSIS — M961 Postlaminectomy syndrome, not elsewhere classified: Secondary | ICD-10-CM | POA: Insufficient documentation

## 2017-08-23 DIAGNOSIS — M5416 Radiculopathy, lumbar region: Secondary | ICD-10-CM | POA: Insufficient documentation

## 2017-08-23 DIAGNOSIS — G9009 Other idiopathic peripheral autonomic neuropathy: Secondary | ICD-10-CM | POA: Insufficient documentation

## 2017-08-23 DIAGNOSIS — G96810 Intracranial hypotension, unspecified: Secondary | ICD-10-CM | POA: Insufficient documentation

## 2017-08-23 NOTE — Addendum Note (Signed)
Addended by: Peter Minium on: 08/23/2017 07:03 PM     Modules accepted: Orders, SmartSet

## 2017-08-26 ENCOUNTER — Encounter (HOSPITAL_BASED_OUTPATIENT_CLINIC_OR_DEPARTMENT_OTHER): Payer: Self-pay | Admitting: Pain Medicine

## 2017-08-27 ENCOUNTER — Encounter (INDEPENDENT_AMBULATORY_CARE_PROVIDER_SITE_OTHER): Payer: Self-pay | Admitting: Pain Medicine

## 2017-08-27 ENCOUNTER — Encounter (HOSPITAL_BASED_OUTPATIENT_CLINIC_OR_DEPARTMENT_OTHER): Payer: Self-pay | Admitting: Pain Medicine

## 2017-08-27 NOTE — Progress Notes (Signed)
Faxed consult notes from 08/21/2017 per Dr. Dwana Melena request to [facility] CSF Leak Program Dr. Rondel Oh   Phone number# [phone]  Fax# [phone]

## 2017-08-28 ENCOUNTER — Encounter (INDEPENDENT_AMBULATORY_CARE_PROVIDER_SITE_OTHER): Payer: Self-pay

## 2017-09-11 ENCOUNTER — Ambulatory Visit
Payer: No Typology Code available for payment source | Attending: Neurology | Admitting: Rehabilitative and Restorative Service Providers"

## 2017-09-11 ENCOUNTER — Encounter

## 2017-09-11 ENCOUNTER — Telehealth (HOSPITAL_BASED_OUTPATIENT_CLINIC_OR_DEPARTMENT_OTHER): Payer: Self-pay | Admitting: Pain Medicine

## 2017-09-11 ENCOUNTER — Other Ambulatory Visit: Payer: Self-pay

## 2017-09-11 DIAGNOSIS — M533 Sacrococcygeal disorders, not elsewhere classified: Principal | ICD-10-CM | POA: Insufficient documentation

## 2017-09-11 NOTE — Interdisciplinary (Signed)
Physical Therapy Evaluation    Referring Physician Emrik Saras    Diagnosis     ICD-10-CM ICD-9-CM    1. Sacral pain M53.3 724.6        Preferred Language:English    Start of Service  Start of Care: 09/11/17  Onset date : chronic  Reason for referral: Pain;Other (comment)         Activity Restrictions  Activity Restrictions: None         Past Medical History:   Diagnosis Date    Acne     Back pain     secondary to herniated disc, from back injury in 2000    Hyperhidrosis     propanolol tx    Hyperlipidemia     LDL > 200, TG ~400 at Dx, statin intolerant.  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 He has had ESI injection which helped some. Had a laminectomy in 2014 and this was first time in 15 years that he didn't feel the referred pain. Started having scaral referred pain, had another ESI which helped some, however eventually started feeling this diffuse, spider-like pain in the sacru, coccyx pain, into lateral right leg and eye pressure--hypotension. He has been worked up by the Orthopedic Dept and pain management. He has another ESI planned. The sacral pain is increased when sitting, lessens with walking. He is uanble to sit for longer than 90 minutes. He is restricted with exercise 2/2 the fibrin glue pacthes. He also reports occasional numbness in the perineum, but he is not too concerned with this. Denied bladder issues, No pain with bowel movement.   --    Previous treatment for condition   Outpatient Physical Therapy  --    Mechanism of Injury  Insidious onset  --    Past medical/surgical history affecting therapy   Other (comments) see EMR  --    Medications affecting therapy   Other (comments) see EMR  --    Diagnostic Tests  MRI  --    Diagnostic test comments   -- see EMR  --    Fall history  No reported falls in the last 6 months  --    Onset date   --  chronic    Reason for referral  --  Pain;Other (comment)    Row Name 09/11/17 1200          Functional History    Prior Level of Function  No deficits     Communication   No communication impairment     Employment Status  Employed clinical psychologist     [place] Name 09/11/17 1200          Social History    Leisure Activity/Participation  recumbent bike, softball, hike, soccer, swim     Row Name 09/11/17 1200  Pain Assessment    Pain Asssessment Tool  Numeric Pain Rating Scale     Row Name 09/11/17 1200          Numeric Pain Rating Scale     Pain Intensity - rating at present  0     Pain Intensity - rating at worst   8     Frequency   Intermittent     Description   Aching;Burning;Cramping;Radiating;Numbness     Location   sacral/coccyx     Row Name 09/11/17 1200          Pain Characteristics    Exacerbating Factors Activity - worse while    sitting          Observation  Posture: WNL    Aggravating movement  Supine to sit    Lumbar Active range of motion   Flexion: WFL  Extension: mild limitation  Sidebend right: mild limitation  Sidebend left: mild limitation  Rotation right: WNL  Rotation left: WNL    Pelvic Shear test  Right: limited  Left: limited    Pelvic rotation  Limited BL      Hip passive range of motion   WFL    Special Tests  Slump: Right negative, Left negative  Straight leg raise: Right negative, Left negative  Hamstring length (straight leg raise): limited right>>left  Sacral spring: negative  ASLR: holds breath. Pain free      Accessory mobility  Decreased segmental mobility of thoracic, lumbar, and sacrum  bilaterally    Internal rectal assessment:  Increased PFM tone, decreased mobility of the coccyx with mild increase in flexion    Palpation:  Increased muscle tone and soft tissue restrictions to lateral border of sacrum and coccyx left>>right    ,    Assessment:  Mr. Zerby is a 37 year old male who presents to PT for initial evaluation of sacrococcygeal pain and intermittent perineal numbness. Pt has an extensive and complicated PMH which includes post laminectomy and discectomy in 2014, dural tear, intracranial hypotension with CSF leak, lumbar and sacral radiculopathy, sphenopalatine ganglion neuralgia.  Pt has completed a course of OPPT without much improvement, has had mild-short term pain relief with ESIs. Primary impairments include decreased segmental mobility of the thoracic and lumbar spine, decreased mobility of the sacrum and coccyx with functional movements, soft tissue restrictions most notable bilaterally at the sacral borders and left lateral border of the coccyx, mild increase in positional flexion of the coccyx, limitations in lumbar and pelvic ROM, and increased PFM tone without reproduction of radicular symptoms or familiar pain. Functional impairments include pain/difficulty with functional transfers; supine<>sit, prolonged sitting; > 90 minutes, limited tolerance with work related tasks and recreational hobbies. Pt will benefit from PFPT in conjunction with a HEP to address impairments, improve functional mobility tolerance, and optimize movement and postural strategies to avoid exacerbation of symptoms. Plan is to continue with external and internal manual therapy next session.      -     Row Name 09/11/17 1400          Patient/Family Education    Education Topic(s)  Exercise Program stretches, sitting posture     Row Name 09/11/17 1400          Assessment     Rehab Potential  [place] Name 09/11/17 1400          Patient stated Goal    Patient stated goal  To have less pain  [place]  Name 09/11/17 1400          GOAL 1 (Short Term)    Impairment  Education     Education  Patient able to return demonstrate Home Exercise Program independently to enable patient to achieve stated functional goal     No. visits  1-3     Goal status  New     Row Name 09/11/17 1400          GOAL 2 (Short Term)     Impairment   Pain     Pain  Patient able to tolerate work duties with subjective pain level no greater than     Level   2/10     No. visits  3-5     Row Name 09/11/17 1400          Goal  (Long Term)     Functional deficit summary  Decline in functional mobility     Goal (Long Term)   Pt will tolerate an 8 hour work day, be able to sit for 90 minutes with pain free     No. visits  7-10     Goal status  New     Row Name 09/11/17 1400          Planned Therapy Interventions and Rationale    Manual Therapy  to improve joint and soft tissue mobility;to improve segmental joint mobility;to decrease tissue tension and restriction;to decrease pain     Neuromuscular Re-Education  to improve posture;to normalize tone;to improve coordination;to improve kinesthetic awareness     Patient Education  to increase knowledge of purpose of home exercise program;to increase knowledge of precautions to prevent/minimize complications of condition     Theraputic Exercise  to improve activity tolerance;to increase flexibility;to improve posture;to increase range of motion;to increase strength     Row Name 09/11/17 1400          Outpatient Treatment Plan    OP Treatment Frequency  1 time per week     OP Treatment Duration  8 weeks     OP Status of treatment  Patient evaluated and will benefit from ongoing skilled therapy     [place] Name 09/11/17 1400          Treatment Plan Discussion    Include in My Healthcare  Myself     Treatment Plan Discussion & Agreement  Patient     Patient/Family Questions  Yes - All questions asked & answered     [place] Name 09/11/17 1400          Focus for Next Treatment    Focus for Next Treatment  Other (comment)  reassess coccyx/sacrum, coccyx mobs     Row Name 09/11/17 1400          Type of Eval    Moderate Complexity (97162)  Completed     [place] Name 09/11/17 1400          Therapeutic Procedures    Manual therapy (97140)   Soft tissue mobilization        Total TIMED Treatment (min)   15       Manual therapy   Bl gluteals, coccygeus     Therapeutic exercise  (89381)   Patient education;Flexibility exercises        Total TIMED Treatment (min)   15       Therapeutic exercise    HF str, adductor str, butterfly str, piriformis str, HS str against wall. Disucssed strategies for postural  awarness with sitting; lumbar support, stool and seat cushion     Row Name 09/11/17 1400          Treatment Time     Total TIMED Treatment  (min)  30     Total Treatment Time (min)  60           The physical therapist of record is endorsed by evaluating physical therapist.

## 2017-09-11 NOTE — Telephone Encounter (Signed)
Patient called immediately requesting to speak with a Supervisor. Inquired about his concern and asked if there was anything I can help him with before making an attempt to contact the Supervisor. Per patient, "When I call the [phone] to schedule, it has been pretty difficult the last couple of times. 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 The last 2 times I called, the lady I spoke with was abrupt and interpersonally abrasive, even recommending that I should go see another Dr." Patient expressed that he inquired about the availability of a certain date and in response was told, "you asked me about scheduling, now you're telling me how to schedule." Per patient, "I've experienced similar behavior the last 2 times I called and wanted to see what steps would be appropriate in addressing this." Patient expressed that he needs to work around his own patient schedule, but that he would rather avoid dealing with the schedulers. With permission, patient was placed on hold. Spoke with Manager who was assisting another patient, but offered the option to leave a voicemail. Patient was made aware of this but declined the option to leave a voicemail. He was provided with [facility] [employer] contact information, and informed that a message will be sent regarding his concerns so that the Manager can contact him. Patient was in agreement and is requesting to be contacted at ph#[phone]. Please advise.

## 2017-09-18 ENCOUNTER — Ambulatory Visit
Payer: No Typology Code available for payment source | Attending: Neurology | Admitting: Rehabilitative and Restorative Service Providers"

## 2017-09-18 DIAGNOSIS — M533 Sacrococcygeal disorders, not elsewhere classified: Principal | ICD-10-CM | POA: Insufficient documentation

## 2017-09-18 NOTE — Interdisciplinary (Signed)
Physical Therapy Daily Follow up Note    Referring Physician: Luvern Saras           Preferred [place]    Patient to continue therapy for     ICD-10-CM ICD-9-CM    1. Sacral pain M53.3 724.6        SUBJECTIVE AND OBJECTIVE     Row Name 09/18/17 1100          Patient stated Goal    Patient stated goal  To have less pain     Row Name 09/18/17 1100          Subjective    Status since last treatment   He is fine. He is has been running around more and has not been experiencing the pain, He has not been doing the stretches 2/2 his busy schedule.      Row Name 09/18/17 1100          Pain Assessment    Pain Asssessment Tool  Numeric Pain Rating Scale     Row Name 09/18/17 1100          Numeric Pain Rating Scale     Pain Intensity - rating at present  0     Pain Intensity - rating at worst   0     Row Name 09/18/17 1100          Pain Characteristics    Exacerbating Factors Activity - worse while    sitting being less active                                     [place] Name 09/18/17 1100          Assessment     Assessment   Pt presents with no pain at rest--baseline when active and no prolonged sitting. Noted soft tissue restrictions at the sacral-coccygeal borders, increased flexion of the coccyx. Pt responded well to manual therapy techniques and reported improved tolerance with sitting. Plan is to continue with current POC. May trial biofeedback next session to assess resting tone of PF.     Rehab Potential  [place] Name 09/18/17 1100          Outpatient Treatment Plan    OP Treatment Frequency  1 time per week     OP Treatment Duration  7 weeks     Row Name 09/18/17 1100          Focus for Next Treatment    Focus for Next Treatment  Other (comment) manual, maybe biofeedback     [place] Name 09/18/17 1100          Treatment Plan Discussion    Treatment Plan Discussion & Agreement  Patient     Patient/Family Questions  Yes - All questions asked & answered     [place] Name  09/18/17 1300          Therapeutic Procedures    Manual therapy (97140)   Joint mobilization;Myofascial release;Soft tissue mobilization;Other (comment)        Total TIMED Treatment (min)   45       Manual therapy   STM BL lumbar paraspinals, Gr.III-IV PAs BL L1-L5, L5-S1. Sacral decompression, STM/MFR BL gluteals--emphasis at the sacral and coccyx borders. Lateral distraction/mobs to coccyx in prone with alternating hip ER/IR and hip ext     Row Name  09/18/17 1100          Treatment Time     Total TIMED Treatment  (min)  45     Total Treatment Time (min)  45

## 2017-10-01 ENCOUNTER — Encounter (HOSPITAL_BASED_OUTPATIENT_CLINIC_OR_DEPARTMENT_OTHER): Admission: RE | Disposition: A | Discharge status: Home Health | Payer: Self-pay | Attending: Pain Medicine

## 2017-10-01 ENCOUNTER — Ambulatory Visit
Admission: RE | Admit: 2017-10-01 | Discharge: 2017-10-01 | Disposition: A | Discharge status: Home / Self Care | Payer: No Typology Code available for payment source | Attending: Pain Medicine | Admitting: Pain Medicine

## 2017-10-01 ENCOUNTER — Encounter (HOSPITAL_BASED_OUTPATIENT_CLINIC_OR_DEPARTMENT_OTHER): Payer: Self-pay | Admitting: Pain Medicine

## 2017-10-01 DIAGNOSIS — E785 Hyperlipidemia, unspecified: Secondary | ICD-10-CM | POA: Insufficient documentation

## 2017-10-01 DIAGNOSIS — J3489 Other specified disorders of nose and nasal sinuses: Secondary | ICD-10-CM | POA: Insufficient documentation

## 2017-10-01 DIAGNOSIS — G96810 Intracranial hypotension, unspecified: Secondary | ICD-10-CM

## 2017-10-01 DIAGNOSIS — Z888 Allergy status to other drugs, medicaments and biological substances status: Secondary | ICD-10-CM | POA: Insufficient documentation

## 2017-10-01 DIAGNOSIS — M792 Neuralgia and neuritis, unspecified: Secondary | ICD-10-CM | POA: Insufficient documentation

## 2017-10-01 DIAGNOSIS — M549 Dorsalgia, unspecified: Secondary | ICD-10-CM | POA: Insufficient documentation

## 2017-10-01 DIAGNOSIS — R51 Headache: Secondary | ICD-10-CM | POA: Insufficient documentation

## 2017-10-01 DIAGNOSIS — G9389 Other specified disorders of brain: Secondary | ICD-10-CM | POA: Insufficient documentation

## 2017-10-01 DIAGNOSIS — G9009 Other idiopathic peripheral autonomic neuropathy: Secondary | ICD-10-CM | POA: Insufficient documentation

## 2017-10-01 SURGERY — PAIN SYMP NERVE BLOCK, GANGLION SPHENOPALATINE
Laterality: Bilateral

## 2017-10-01 MED ORDER — BUPIVACAINE HCL (PF) 0.5 % IJ SOLN
INTRAMUSCULAR | Status: DC
Start: 2017-10-01 — End: 2017-10-01
  Filled 2017-10-01: qty 10

## 2017-10-01 MED ORDER — BUPIVACAINE HCL (PF) 0.5 % IJ SOLN
INTRAMUSCULAR | Status: DC | PRN
Start: 2017-10-01 — End: 2017-10-01
  Administered 2017-10-01 (×2): 2.4 mL

## 2017-10-01 SURGICAL SUPPLY — 23 items
APPLICATOR CHLORAPREP 3ML, CLEAR (Misc Medical Supply) ×2
CANNULA NASAL FLARED W/7' TUBE (Misc Medical Supply)
CATHETER IV INSYTE 18G X 1 1/4 (Needles/punch/cannula/biopsy) IMPLANT
CATHETER IV INSYTE 20G X 1 1/4 (Needles/punch/cannula/biopsy)
CATHETER IV INSYTE 22G X 1 (Needles/punch/cannula/biopsy)
CATHETER IV INSYTE 24G 3/4 (Needles/punch/cannula/biopsy)
COVER ULTRASOUND PROBE W/ GEL (Drapes/towels) IMPLANT
ELECTRODES ECG CLEARTRACE (Cautery) IMPLANT
KIT,IV START (Patient Care Supply) IMPLANT
LINE FILTER SMART CAPNOLINE O2 PLUS LONG ADULT (Misc Medical Supply) IMPLANT
MARKER SECURELINE SURG SKIN (Misc Medical Supply) ×2
NEEDLE ECHOBLOCK MSK 21G X 3 1/8" (Needles/punch/cannula/biopsy) IMPLANT
NEEDLE ECHOBLOCK MSK 21G X 4" (Needles/punch/cannula/biopsy) IMPLANT
NEEDLE ECHOBLOCK MSK 22G X 2" (Needles/punch/cannula/biopsy)
NEEDLE SPINAL 22G X 7", QUINCKE (Needles/punch/cannula/biopsy)
NEEDLE SPINAL 22GA X 5 (Needles/punch/cannula/biopsy)
NEEDLE SPINAL BD 22G X 3.5" (Needles/punch/cannula/biopsy)
NEEDLE SPINAL BD 25G X 3.5" (Needles/punch/cannula/biopsy)
NEEDLE SPINE QUINCKE 23G X 6" (Needles/punch/cannula/biopsy)
SOLUTION IV 0.9% NS 1000ML (Non-Pharmacy Meds/Solutions) IMPLANT
TRAY SINGLE SHOT EPIDURAL (Procedure Packs/kits) ×2 IMPLANT
TUBING EXTENSION SET MICROBORE 20" (Tubing/Suction) IMPLANT
TUBING SET FOR ALARIS PUMP WITH 2 SMARTSITE PORTS (Tubing/Suction)

## 2017-10-01 NOTE — Discharge Instructions (Signed)
Patient verbalized understanding of below instructions    Procedure Done Today: Bilateral Sphenopalatine Ganglion Block    Follow up: With next procedure; Caudal Epidural Steroid injection    Post Procedure Instructions  Continue present medication unless otherwise indicated.  Resume your normal diet after being discharge.  Ice pack to treatment site (no more than 20 minutes at a time) if needed for pain relief and/or muscle spasm.  Avoid strenuous activities and driving until tomorrow morning.  If you have a band-aid dressing, you may remove it tomorrow morning.  Resume normal activities tomorrow morning, unless otherwise directed.  No submersion in water for 24 hrs.  If your next appointment is at the Pain Clinic, then please call [phone] to make an appointment.  If your next appointment is another procedure, then please call [phone] to make an appointment.    Call the [facility],  [phone] and ask for the pain management provider on call for ANY sign of:  Temperature above 101.5 F  Redness or drainage at the treatment site  Severe, uncontrollable headache    Call [place]

## 2017-10-01 NOTE — H&P (Signed)
Ambulatory Surgery/Invasive Procedure History and Physical      Primary Care Physician [name]    Chief Complaint:  headache     37 year old male here for scheduled procedure.    BP 145/85    Temp 98.3 F (36.8 C)    Resp 10    SpO2 100%     Past Medical History:   Diagnosis Date    Acne     Back pain     secondary to herniated disc, from back injury in 2000    Hyperhidrosis     propanolol tx    Hyperlipidemia     LDL > 200, TG ~400 at Dx, statin intolerant.  Per pt, r/o for familial HDL    Parasomnia associated with sexual behavior     Tx with Klonapin, has been being downtitrated       Allergies   Allergen Reactions    Atorvastatin Other     Debilitating myalgias    Pravastatin Other     Debilitating myalgias    Rosuvastatin Other     Debilitating myalgias    Zetia [Ezetimibe] Other     Debilitating myalgias       No current facility-administered medications for this encounter.        I have reviewed the past medical history, allergies and current medications as documented in the electronic health record.      Physical Exam       Can this patient make their own healthcare decisions?  Yes    Chest:  Breaths easily     Heart:  RRR    Abdomen:  Soft    Pain Management Needs/Options discussed.    No Advanced Directives.      Resuscitative Status:  Full Code, Full Care    Diagnosis:    ICD-10-CM ICD-9-CM    1. Intracranial hypotension G93.89 348.89     Added automatically from request for surgery [id number]   2. Sphenopalatine ganglion neuralgia G90.09 337.09     Added automatically from request for surgery [phone]       Procedure: SPG blocks with Tx360    The procedure is designed to be therapeutic.    Discussed Risks, Benefits, and Alternatives to procedure.  Questions answered.  Patient voiced understanding and wished to proceed. Consent Signed.    See procedure note of same date.

## 2017-10-01 NOTE — Op Note (Signed)
Procedure Note, [facility]     Preoperative Diagnosis:  Bilateral sphenopalatine ganglion neuralgia     Postoperative Diagnosis: Bilateral sphenopalatine ganglion neuralgia     Procedure: 1. Bilateral sphenopalatine ganglion nerve block     Surgeon/Staff:  Peter Minium, MD  Assistant: -------------------     Anesthesia:  None                                                               Indications: The patient c/o Bilateral nasal/facial pain. The patient's history and physical findings are consistent with  Bilateral sphenopalatine ganglion neuralgia.  They are here for a Bilateral sphenopalatine ganglion nerve block. This is the first injection performed for this patient.        Procedure in Detail:  Written informed consent was obtained.  The chart was reviewed, questions were answered and the patient wished to proceed. The patient had no contraindications to the procedure.  Vital signs were stable. Standard monitoring was applied.  The patient, the procedure nurse, and the physician confirmed the site of injection after an official "time out."      Localization Time Out:  An additional intraoperative timeout, specifically to confirm accurate localization, was conducted by the attending physician.      The patient remained awake and alert throughout the procedure. the patient was seated. A Tx360 catheter was inserted transnasally x2 on each side, blowing nose before and after each injection     The catheter was injected with Bupivicaine (0.5%)  0.3 mL x 2 = 1.2 mL on each side.  The catheter was removed between each.  A sterile bandage was applied.  The patient did not experience any hemodynamic or neurologic sequelae.   The attending physician was present during all critical points of the procedure..     The patient was transferred to the recovery area. Post operative instructions were explained and given to the patient.  The patient was discharged in stable health and given an appointment  for SPG block #2 in 2 weeks.

## 2017-10-02 ENCOUNTER — Encounter (HOSPITAL_BASED_OUTPATIENT_CLINIC_OR_DEPARTMENT_OTHER): Payer: Self-pay | Admitting: Hospital

## 2017-10-04 ENCOUNTER — Encounter (INDEPENDENT_AMBULATORY_CARE_PROVIDER_SITE_OTHER): Payer: Self-pay

## 2017-10-15 ENCOUNTER — Encounter

## 2017-10-15 ENCOUNTER — Other Ambulatory Visit: Payer: Self-pay

## 2017-10-15 ENCOUNTER — Ambulatory Visit
Payer: No Typology Code available for payment source | Attending: Neurology | Admitting: Rehabilitative and Restorative Service Providers"

## 2017-10-15 ENCOUNTER — Telehealth (HOSPITAL_BASED_OUTPATIENT_CLINIC_OR_DEPARTMENT_OTHER): Payer: Self-pay

## 2017-10-15 DIAGNOSIS — M533 Sacrococcygeal disorders, not elsewhere classified: Secondary | ICD-10-CM | POA: Insufficient documentation

## 2017-10-15 NOTE — Telephone Encounter (Signed)
msg sent to [facility]    "Hello there,    Please request more visits if patient should continue tx.    Thank you!  "

## 2017-10-15 NOTE — Interdisciplinary (Signed)
Physical Therapy Daily Follow up Note    Referring Physician: Anan Saras           Preferred [place]    Patient to continue therapy for     ICD-10-CM ICD-9-CM    1. Sacral pain M53.3 724.6    2. Coccydynia M53.3 724.79        SUBJECTIVE AND OBJECTIVE     Row Name 10/15/17 1500          Patient stated Goal    Patient stated goal  To have less pain     Row Name 10/15/17 1500          Subjective    Status since last treatment   He had a really good massage since last visit, however, had a lot of sacral pain and referred pain aftewards which he thinks is 2/2 the muscle being relaxed and not supporting the herniation. Also reoprts increasing headaches.      [place] Name 10/15/17 1500          Pain Assessment    Pain Asssessment Tool  Numeric Pain Rating Scale     Row Name 10/15/17 1500          Numeric Pain Rating Scale     Pain Intensity - rating at present  3     Pain Intensity - rating at worst   6     Location   sacral/coccyx region     Pain Description  Prolonged sitting and inactivity     Row Name 10/15/17 1500          Pain Characteristics    Exacerbating Factors Activity - worse while    sitting                                     ASSESSMENT AND PLAN     Row Name 10/15/17 1500          Assessment     Assessment   Pt presents with mild pain to the area of the sacrum and headache. Noted soft tissue restrictions to cervical musculature and posterior pelvic musculature. Pt responded well to manual therapy techniques and stretches. Plan is to trial biofeedback next session with possible internal work.     Rehab Potential  [place] Name 10/15/17 1500          Outpatient Treatment Plan    OP Treatment Frequency  1 time per week     OP Treatment Duration  6 weeks     Row Name 10/15/17 1500          Focus for Next Treatment    Focus for Next Treatment  Other (comment)     [place] Name 10/15/17 1500          Treatment Plan Discussion    Treatment Plan Discussion & Agreement  Patient     Patient/Family Questions  Yes - All questions asked & answered     [place] Name 10/15/17 1700          Therapeutic Procedures    Manual therapy (97140)   Joint mobilization;Myofascial release;Soft tissue mobilization;Other (comment)        Total TIMED Treatment (min)   45       Manual therapy   STM BL suboccipitals, thoracic, lumbar paraspinals, Gr.III-IV PAs BL L1-L5, L5-S1. Sacral decompression, STM/MFR BL gluteals--emphasis at the sacral and coccyx borders.  Lateral distraction/mobs to coccyx in prone with alternating hip ER/IR and hip ext     Therapeutic exercise  (83462)   Patient education;Flexibility exercises        Total TIMED Treatment (min)   15       Therapeutic exercise    HF str, adductor str, butterfly str, piriformis str, HS str against wall. Disucssed strategies for postural awarness with sitting; lumbar support, stool and seat cushion, ball massage to c-spine and gluteals     Row Name 10/15/17 1500          Treatment Time     Total TIMED Treatment  (min)  45     Total Treatment Time (min)  45

## 2017-10-23 ENCOUNTER — Ambulatory Visit (HOSPITAL_BASED_OUTPATIENT_CLINIC_OR_DEPARTMENT_OTHER): Payer: No Typology Code available for payment source | Admitting: Rehabilitative and Restorative Service Providers"

## 2017-10-23 ENCOUNTER — Encounter (HOSPITAL_BASED_OUTPATIENT_CLINIC_OR_DEPARTMENT_OTHER): Payer: Self-pay | Admitting: Pain Medicine

## 2017-10-23 ENCOUNTER — Other Ambulatory Visit: Payer: Self-pay

## 2017-10-23 DIAGNOSIS — M533 Sacrococcygeal disorders, not elsewhere classified: Principal | ICD-10-CM

## 2017-10-23 NOTE — Interdisciplinary (Signed)
Physical Therapy Daily Follow up Note    Referring Physician: Cord Saras           Preferred [place]    Patient to continue therapy for     ICD-10-CM ICD-9-CM    1. Sacral pain M53.3 724.6    2. Coccydynia M53.3 724.79        SUBJECTIVE AND OBJECTIVE     Row Name 10/23/17 1600          Patient stated Goal    Patient stated goal  To have less pain     Row Name 10/23/17 1600          Subjective    Status since last treatment   Feeling ok. He spoke with a CSF specialist at [facility] regarding head pressure and will be heading up there for treatment this week. Dr. Rozell Searing there is another CSF leak and will also be getting a mylogram to reassess herniated disks. He feels a lot of his symptoms are related to disk buldges. He has been very active and has not really had much coccyx pain. Still has some pain with sitting within in 60 minutes. Today c/o pressure behind the eyes and with change of position.      [place] Name 10/23/17 1600          Pain Assessment    Pain Asssessment Tool  Numeric Pain Rating Scale     Row Name 10/23/17 1600          Numeric Pain Rating Scale     Pain Intensity - rating at present  0     Pain Intensity - rating at worst   6     Location   back, head/pressure                                     ASSESSMENT AND PLAN     Row Name 10/23/17 1600          Assessment     Assessment   Pt presents with pressure behind the eyes. Noted soft tissue restirctions to right lateral sacral border and a mild increase in flexion of coccyx with reassessment of sacrococcygeal joint. Pt responded well to manual therapy techniques. Pt will be following up with his provider at [facility] regarding possible CSF leak and further evaluation of herniated disks and radicular symptoms next week. I do not suspect pt's symptoms are solely related to sacrum/coccyx dysfunction at this time. Plan is to reassess and continue with manual therapy next session.     Rehab Potential  [place] Name 10/23/17 1600             Outpatient Treatment Plan    OP Treatment Frequency  1 time per week     OP Treatment Duration  6 weeks     Row Name 10/23/17 1600          Focus for Next Treatment    Focus for Next Treatment  Other (comment) reassess sacrum/coccyx, biofeedback?     [place] Name 10/23/17 1600          Treatment Plan Discussion    Treatment Plan Discussion & Agreement  Patient     Patient/Family Questions  Yes - All questions asked & answered     [place] Name 10/23/17 1800          Therapeutic Procedures    Manual therapy (  79432)   Joint mobilization;Myofascial release;Soft tissue mobilization;Other (comment)        Total TIMED Treatment (min)   60       Manual therapy  . Sacral decompression, STM/MFR BL gluteals--emphasis at the sacral and coccyx borders. Lateral distraction/mobs to coccyx in prone with alternating hip ER/IR and hip ext     Row Name 10/23/17 1600          Treatment Time     Total TIMED Treatment  (min)  60     Total Treatment Time (min)  60

## 2017-10-24 ENCOUNTER — Encounter (INDEPENDENT_AMBULATORY_CARE_PROVIDER_SITE_OTHER): Payer: Self-pay

## 2017-10-25 ENCOUNTER — Encounter (INDEPENDENT_AMBULATORY_CARE_PROVIDER_SITE_OTHER): Payer: Self-pay

## 2017-10-25 ENCOUNTER — Encounter (INDEPENDENT_AMBULATORY_CARE_PROVIDER_SITE_OTHER): Payer: Self-pay | Admitting: Internal Medicine

## 2017-10-25 ENCOUNTER — Encounter (HOSPITAL_BASED_OUTPATIENT_CLINIC_OR_DEPARTMENT_OTHER): Payer: Self-pay | Admitting: Pain Medicine

## 2017-10-25 DIAGNOSIS — G9600 Cerebrospinal fluid leak, unspecified: Secondary | ICD-10-CM

## 2017-10-25 NOTE — Progress Notes (Signed)
Order placed for referral to Dr. Rondel Oh    Order placed for MRI C/T/L myelogram to be performed at [facility]

## 2017-10-25 NOTE — Telephone Encounter (Signed)
Checking to see referral but I don't see one that you've entered.  Please advise ?

## 2017-10-28 ENCOUNTER — Encounter (HOSPITAL_BASED_OUTPATIENT_CLINIC_OR_DEPARTMENT_OTHER): Payer: Self-pay

## 2017-10-28 ENCOUNTER — Encounter (INDEPENDENT_AMBULATORY_CARE_PROVIDER_SITE_OTHER): Payer: Self-pay | Admitting: Internal Medicine

## 2017-10-28 NOTE — Telephone Encounter (Signed)
From: [name]  To: [name], MD  Sent: 10/28/2017 1:38 PM PDT  Subject: 3-Referral Request Status    Great, thank you. Just to confirm, Dr. Selinda Eon placed the referral order, correct?    (I am corresponding with [facility] so wanted to make sure I provided them with accurate information). Thanks     ----- Message -----  From: Venita Sheffield  Sent: 10/28/17, 13:09  To: [name]  Subject: RE: 3-Referral Request Status    Mr.Heider, the requested referral order to [facility] has been completed and submitted for insurance review and authorization. Thank you,.    Alyse Low [place]    ----- Message -----   From: [name]   Sent: 10/25/2017 4:04 PM PDT   To: [name], MD  Subject: 3-Referral Request Status    Hi Dr. Selinda Eon,    See the string of emails below. Feel free to call me at [phone]. Bottom line is that one of the top docs doing diagnostics and treatment for CSF leaks at [facility] called me this week and would like to see me next week for an MRI myelogram followed by a consultation appointment on Thursday of next week. He thinks I have another leak. Are you able to put in these orders? Let me know if theres a more parsimonious way to proceed with this. Hope all is well. Thanks!    My message:  Hi Dr. Thereasa Parkin, I have an MRI myelogram appointment at [facility] on Thursday, followed by a consultation appointment with Dr. Trinna Balloon. I spoke with [facility] to ensure I have coverage for these appointments, and they advised me to send this info to you...   Both of the below need to be requested stat request (rather than standard request):   1) The referral to see Dr. Trinna Balloon needs to come as authorization request directly to [place] health care   2) The MRI needs to be requested as an authorization to [employer]     Dr. Lloyd Huger responses:  What MRI areas do they want?   With and without contrast? Just without contrast?   I can't promise that my office  staff will be able to get these authorizations in time, we have a backlog, but if you call the office hopefully you can help move this along   For insurance authorization questions, please call [phone]. If someone doesn't pick up, please leave a message.   Typically, our office doesn't get authorizations for these things, usually this would be primary care. So I'm asking my office staff to do things that they don't usually do. If they can't figure it out, you might want to ask your PCP to order these.

## 2017-10-28 NOTE — Telephone Encounter (Signed)
Ok to queue up referral and MRI with indication "csf leak"  Also please ask him to nonurgently see me to discuss as well given so many moving parts

## 2017-10-28 NOTE — Telephone Encounter (Signed)
From: [name]  To: [name], MD  Sent: 10/25/2017 4:29 PM PDT  Subject: 3-Referral Request Status    Hi Dr. Selinda Eon, I just spoke with the scheduler at [facility] and she mentioned that she didn't realize I had an HMO. She stated that my PCP just needs to authorize one consultation appointment with neurosurgery at [facility]. Would you be able to do that? She mentioned they will work on authorization for MRI myelogram. Sorry if this is confusing; I'm doing my best to make this as easy on everyone as possible while also getting access to services! -Erik Burke

## 2017-10-28 NOTE — Telephone Encounter (Signed)
Message forwarded to the pcp for review/recommendations.

## 2017-10-28 NOTE — Telephone Encounter (Signed)
From: [name]  To: [name], MD  Sent: 10/25/2017 4:04 PM PDT  Subject: 3-Referral Request Status    Hi Dr. Selinda Eon,    See the string of emails below. Feel free to call me at [phone]. Bottom line is that one of the top docs doing diagnostics and treatment for CSF leaks at [facility] called me this week and would like to see me next week for an MRI myelogram followed by a consultation appointment on Thursday of next week. He thinks I have another leak. Are you able to put in these orders? Let me know if theres a more parsimonious way to proceed with this. Hope all is well. Thanks!    My message:  Hi Dr. Thereasa Parkin, I have an MRI myelogram appointment at [facility] on Thursday, followed by a consultation appointment with Dr. Trinna Balloon. I spoke with [facility] to ensure I have coverage for these appointments, and they advised me to send this info to you...   Both of the below need to be requested stat request (rather than standard request):   1) The referral to see Dr. Trinna Balloon needs to come as authorization request directly to [place] health care   2) The MRI needs to be requested as an authorization to [facility]     Dr. Lloyd Huger responses:  What MRI areas do they want?   With and without contrast? Just without contrast?   I can't promise that my office staff will be able to get these authorizations in time, we have a backlog, but if you call the office hopefully you can help move this along   For insurance authorization questions, please call [phone]. If someone doesn't pick up, please leave a message.   Typically, our office doesn't get authorizations for these things, usually this would be primary care. So I'm asking my office staff to do things that they don't usually do. If they can't figure it out, you might want to ask your PCP to order these.

## 2017-10-28 NOTE — Telephone Encounter (Signed)
add'l patient mychart message copied/pasted.    Message:    Hi Dr. Selinda Eon, I just spoke with the scheduler at [facility] and she mentioned that she didn't realize I had an HMO. She stated that my PCP just needs to authorize one consultation appointment with neurosurgery at [facility]. Would you be able to do that? She mentioned they will work on authorization for MRI myelogram. Sorry if this is confusing; I'm doing my best to make this as easy on everyone as possible while also getting access to services! -[name]

## 2017-10-28 NOTE — Telephone Encounter (Signed)
Done faxed referral to Dr. Sheliah Hatch office .  Pt informed

## 2017-10-30 ENCOUNTER — Ambulatory Visit (HOSPITAL_BASED_OUTPATIENT_CLINIC_OR_DEPARTMENT_OTHER): Payer: No Typology Code available for payment source | Admitting: Rehabilitative and Restorative Service Providers"

## 2017-10-30 ENCOUNTER — Encounter (HOSPITAL_BASED_OUTPATIENT_CLINIC_OR_DEPARTMENT_OTHER): Payer: Self-pay

## 2017-10-31 ENCOUNTER — Telehealth (HOSPITAL_BASED_OUTPATIENT_CLINIC_OR_DEPARTMENT_OTHER): Payer: Self-pay | Admitting: Pain Medicine

## 2017-10-31 ENCOUNTER — Encounter (INDEPENDENT_AMBULATORY_CARE_PROVIDER_SITE_OTHER): Payer: Self-pay | Admitting: Internal Medicine

## 2017-10-31 NOTE — Telephone Encounter (Signed)
Erik Burke from [facility] is f/up on appeal for Dr. Rondel Oh for CSF leak, informed [name] Dr. Thereasa Parkin place order on 10/25/17 but clinic did not process order to [facility] however Dr. Selinda Eon also place an order on 10/28/17 and was authorized on 10/30/17. [name] was provided with both referral numbers and will contact Dr. Drue Novel office to get PA letter so, pt can schedule his visit.

## 2017-10-31 NOTE — Telephone Encounter (Signed)
Cassy,RN from [facility] appeals department is calling regarding the referral to Dr. Rondel Oh. Per Norton Pastel, she would like to speak with a nurse to discuss why Dr Dwana Melena recommendation was to see a provider that is out of network. Norton Pastel states that the request was sent by Dr Selinda Eon per Dr Dwana Melena instructions, so she would like to speak with our office. Norton Pastel is requesting a call back on her direct line at ph#[phone]. Please advise.

## 2017-11-01 NOTE — Telephone Encounter (Signed)
Heather from [facility] with Dr. Sheliah Hatch office is calling to request that MD edit on 3 existing authorizations:    1. [id number] - Lumbar Spine  Please edit to code 72158 & [id number]    2. [id number] - MRI of Cervical Spine  Please edit to code 72156 & 76376    3. [id number] - MRI Thoracic Spine  Please edit to code 72157 & [id number]    Please contact  [place] for any questions at [phone]. Please advise, than you.

## 2017-11-01 NOTE — Telephone Encounter (Signed)
From: [name]  To: [name], MD  Sent: 10/31/2017 4:23 PM PDT  Subject: 20-Other    Hi Dr. Selinda Eon,    Spoke with a [employer] rep today who instructed me to write this letter and fax to them, which I did today. I'm providing this letter to you to keep you in the loop; not sure whether [place] will reach out to you.    Thanks!  [name]

## 2017-11-01 NOTE — Telephone Encounter (Signed)
Printed attachment, placed on MDs desk to Review.     Routed to MD to [employer].

## 2017-11-04 ENCOUNTER — Telehealth (HOSPITAL_BASED_OUTPATIENT_CLINIC_OR_DEPARTMENT_OTHER): Payer: Self-pay | Admitting: Pain Medicine

## 2017-11-04 NOTE — Telephone Encounter (Signed)
Patient is calling to request that an order is placed for a 3d MR Myelogram. He is requesting to be contacted upon completion at ph#[phone]. Per patient it is OK to leave a message. Please advise.

## 2017-11-04 NOTE — Telephone Encounter (Signed)
Please notify patient that he needs to be seen and evaluated in clinic by Dr Thereasa Parkin after his procedures. The requested imaging is very complex and specific, therefore he needs to be evaluated first in clinic and ordered through clinic if indicated with proper documentation so the insurance can approve it.

## 2017-11-06 ENCOUNTER — Other Ambulatory Visit: Payer: Self-pay

## 2017-11-06 ENCOUNTER — Ambulatory Visit (HOSPITAL_BASED_OUTPATIENT_CLINIC_OR_DEPARTMENT_OTHER): Payer: No Typology Code available for payment source | Admitting: Rehabilitative and Restorative Service Providers"

## 2017-11-06 DIAGNOSIS — M533 Sacrococcygeal disorders, not elsewhere classified: Secondary | ICD-10-CM

## 2017-11-06 NOTE — Interdisciplinary (Signed)
Physical Therapy Discharge Summary Note    Referring Physician: Adriane Saras           Preferred Fate    Patient to continue therapy for     ICD-10-CM ICD-9-CM    1. Sacral pain M53.3 724.6    2. Coccydynia M53.3 724.79        SUBJECTIVE AND OBJECTIVE     Row Name 11/06/17 1600          Patient stated Goal    Patient stated goal  To have less pain     Row Name 11/06/17 1600          Subjective    Status since last treatment   Today woke up with sacral pain. He has been taking medication for suspected pressure imbalance. Pain has been the same since last visit--still most notable with prolonged sitting. Thinks the cushion is helping him sit longer than 90 minutes. Urinary frequency is more of an issue; waking up throughout the night.      [place] Name 11/06/17 1600          Pain Assessment    Pain Asssessment Tool  Numeric Pain Rating Scale     Row Name 11/06/17 1600          Numeric Pain Rating Scale     Pain Intensity - rating at present  5     Pain Intensity - rating at worst   5     Location   Sacrum     Row Name 11/06/17 1600          Objective Findings    Objective Findings  Posture: WNL    Pelvic alignment: WNL    Palpation: -ttp    Sacrococcygeal: WNL; no malalignment, good mobility    Sacral spring test: positive for pain                                     ASSESSMENT AND PLAN     Row Name 11/06/17 1600          Assessment     Assessment   Pt has made good progress with PFPT in terms of postural awareness both in sitting and standing, use of relaxation techniques, mobility of soft tissue restrictions and improvements with flexibility, and tolerance with functional work tasks; prolonged sitting at work. Pt has responded well to use of a seat cushion while at work and light, mild-moderate forms of exercise. However, pt continues to be limited by intermittent sacral pain, back pain, and headaches. I suspect pt's symptoms are related to his herniated disks versus sacrococcygeal  dysfunction and on going pressure imbalances 2/2 to his complicated hx/o with CSF leaks. Pt has met all goals set by the supervising PT and no longer requires skilled PT intervention. Advised pt to continue with his HEP as tolerated and f/u with his PCP regarding his ongoing lumbar spine pain and headaches.      Rehab Potential  [place] Name 11/06/17 1600          Outpatient Treatment Plan    OP Treatment Frequency  1 time per week     OP Treatment Duration  6 weeks     OP Status of treatment  Discontinuation of service due to no further need for skilled theraputic intervention     Row Name 11/06/17 1600  Treatment Plan Discussion    Treatment Plan Discussion & Agreement  Patient     Patient/Family Questions  Yes - All questions asked & answered     [place] Name 11/06/17 1600          Patient/Family Education    Education Topic(s)  Exercise Program exercises modifications to minimize pressure     Row Name 11/06/17 1600          GOAL 1 (Short Term)    Impairment  Education     Education  Patient able to return demonstrate Home Exercise Program independently to enable patient to achieve stated functional goal     No. visits  1-3     Goal status  Met     Row Name 11/06/17 1600          GOAL 2 (Short Term)     Impairment   Pain     Pain  Patient able to tolerate work duties with subjective pain level no greater than     Level   2/10     No. visits  3-5     Goal status  Met     Row Name 11/06/17 1600          Goal  (Long Term)     Functional deficit summary  Decline in functional mobility     Goal (Long Term)   Pt will tolerate an 8 hour work day, be able to sit for 90 minutes with pain free     No. visits  7-10     Goal status  Met     Row Name 11/06/17 1600          Discharge Date    Discharge Date  11/06/17     Row Name 11/06/17 1600          Outpatient Therapy Overview    Reason for Discharge   Goals met no further treatment indicated     Status at discharge   Goals met     Response to therapy  Fair     Symptom progression   Improved     Patient attendance  Good, patient has attended at least 75% of scheduled appointments     Patient compliance with therapy program  Excellent     Pain  Improved from baseline     Follow up recommendations  Continue home exercises as tolerated;Follow up with MD     [place] Name 11/06/17 1800          Therapeutic Procedures    Manual therapy (97140)   Joint mobilization;Myofascial release;Soft tissue mobilization;Other (comment)        Total TIMED Treatment (min)   45       Manual therapy   STM BL lumbar paraspinals, Gr.III-IV PAs BL L1-L5, L5-S1. Sacral decompression, STM/MFR BL gluteals--emphasis at the sacral and coccyx borders. Lateral distraction/mobs to coccyx in prone with alternating hip ER/IR and hip ext     Therapeutic exercise  (42353)   Patient education;Flexibility exercises        Total TIMED Treatment (min)   15       Therapeutic exercise    Happy baby, DBT. Discussed exercise modifications to minimize intra-abdominal pressure and breathing with effort.      [place] Name 11/06/17 1600          Treatment Time     Total TIMED Treatment  (min)  60     Total Treatment Time (min)  60

## 2017-11-07 ENCOUNTER — Encounter (HOSPITAL_BASED_OUTPATIENT_CLINIC_OR_DEPARTMENT_OTHER): Payer: Self-pay

## 2017-11-07 NOTE — Telephone Encounter (Signed)
Pt informed through My Chart regarding message below . DONE

## 2017-11-11 ENCOUNTER — Ambulatory Visit (INDEPENDENT_AMBULATORY_CARE_PROVIDER_SITE_OTHER): Payer: No Typology Code available for payment source | Admitting: Dermatology

## 2017-11-11 ENCOUNTER — Encounter (HOSPITAL_BASED_OUTPATIENT_CLINIC_OR_DEPARTMENT_OTHER): Payer: Self-pay

## 2017-11-11 ENCOUNTER — Encounter: Payer: Self-pay | Admitting: Dermatology

## 2017-11-11 ENCOUNTER — Encounter (INDEPENDENT_AMBULATORY_CARE_PROVIDER_SITE_OTHER): Payer: Self-pay | Admitting: Dermatology

## 2017-11-11 ENCOUNTER — Encounter (HOSPITAL_BASED_OUTPATIENT_CLINIC_OR_DEPARTMENT_OTHER): Payer: Self-pay | Admitting: Pain Medicine

## 2017-11-11 DIAGNOSIS — K130 Diseases of lips: Principal | ICD-10-CM

## 2017-11-11 MED ORDER — KETOCONAZOLE 2 % EX CREA
1.0000 | TOPICAL_CREAM | Freq: Two times a day (BID) | CUTANEOUS | 1 refills | Status: DC
Start: 2017-11-11 — End: 2018-01-17

## 2017-11-11 MED ORDER — DESONIDE 0.05 % EX CREA
1.00 | TOPICAL_CREAM | Freq: Two times a day (BID) | CUTANEOUS | 1 refills | Status: DC
Start: 2017-11-11 — End: 2019-05-14

## 2017-11-11 NOTE — Progress Notes (Signed)
Attending Note:    Subjective:  I reviewed the history.  Patient interviewed and examined.  History of present illness (HPI):  Lesion (Lower lip with pain; tried topical hydrocortisone, antibiotic, and antifungal.) and Recheck (Angular chelitis; was out in sun a bit over weekend.)     Review of Systems (ROS): As per  the resident's note.  Past Medical, Family, Social History:  As per  the resident's  note.    Objective:   I have examined the patient and I concur with the resident's exam and of note is erythema and slight swelling of lower lip and scaling at angles of mouth.    Assessment and plan reviewed with the resident physician.  I agree with the resident's plan as documented.  Possible contact derm- pt to stop everything and apply vaseline and desowen to lips til clear  Angular cheilitis- ketoconazole and desowen prn    See the resident's note for further details.

## 2017-11-11 NOTE — Telephone Encounter (Signed)
Heather from [facility] is calling back regarding the previous messages(also see 10/31/17 encounter). Per Nira Conn, she usually is the one who submits for authorization. She is pending receipt of the updated orders. Per Nira Conn, her facility does perform 3d MRI Myelograms. She is not requesting a call back, but will contact the clinic in a few days for a status update. Please note.

## 2017-11-11 NOTE — Telephone Encounter (Signed)
From: [name], MD  To: Virgilio Frees, LVN  Sent: 10/29/2017 4:14 PM PDT  Subject: < No Subject>    Yes, please. The imaging appointment will be used to inform a subsequent consultation appointment with Dr. Rondel Oh at [place]. Hope that help, and let me know if you have any questions. Thanks again, [name]    ----- Message -----  From: Virgilio Frees, LVN  Sent: 10/29/17, 15:04  To: Sheral Apley [place]ney  Subject:     HI Marte,     It is for imaging . Did you need one for consult as well?

## 2017-11-11 NOTE — Telephone Encounter (Signed)
Dr. Greggory Keen is requesting to speak with MD or RN. Patient has a rash and would like to be seen today. Offered appointments for tomorrow 11/12/17 but patient declined because he is seeing patients tomorrow. Added patient to wait list for today.    Dr. Greggory Keen is requesting to speak with a physician or nurse today. Please advise. Ph: [phone] or correspond via [facility]. Thank you    ______________________________  Faythe Ghee to schedule - Auth requested on 11/11/17. Insurance is managed by [employer].

## 2017-11-11 NOTE — Telephone Encounter (Signed)
Please input an outside order to [facility].

## 2017-11-11 NOTE — Progress Notes (Signed)
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 Overton Mam (last visit 07/12/2017).    - patient has had "flare" of angular chelitis x6 weeks  - had been applying OTC antibiotic, OTC antifungal, hydrocortisone cream to corners of lips; has stopped those 2 weeks ago  - still using Blistex and Carmex to the entire lips  - has had history in past, off and on for years  - this morning, patient woke up with white-head pimple type lesions that were mildly painful and lips felt swollen. He sterilized a needle and drained the lesions.  - has never had these types of bumps before; no history of cold sores   - denies new sexual partners; is sexually active with wife only    Dermatologic history:  Melanoma hx (date, location, [place] depth): none  NMSC hx (date, location): none      Family History:  Family Hx of melanoma: negative   Family hx of other skin disease: negative     Family History   Problem Relation Name Age of Onset    Cancer Mother          cervical    No Known Problems Father      Cancer Maternal Grandmother          ovarian    No Known Cancer Maternal Grandfather      Heart Disease Maternal Grandfather      Cancer Paternal Grandmother          pancreatic    Cancer Paternal Grandfather          mesothelioma    Cancer Brother  17        osteosarcoma       Social History:  Lives in [place]; Investment banker, operational, Biomedical engineer in [place] History     Socioeconomic History    Marital status: Married     Spouse name: Not on file    Number of children: Not on file    Years of education: Not on file    Highest education level: Not on file   Occupational History    Not on file   Tobacco Use    Smoking status: Never Smoker    Smokeless tobacco: Never Used   Substance and Sexual Activity    Alcohol use: Yes     Comment: 1-3  drinks infrequently    Drug use: Not on file    Sexual activity: Not on file   Social Activities of Daily Living Present    Not on file   Social History Narrative    PhD in Psychology, works in [employer], [place] [employer]       Allergies:  Allergies   Allergen Reactions    Atorvastatin Other     Debilitating myalgias    Pravastatin Other     Debilitating myalgias    Rosuvastatin Other     Debilitating myalgias    Zetia [Ezetimibe] Other     Debilitating myalgias       Medications:  Current Outpatient Medications on File Prior to Visit   Medication Sig Dispense Refill    acetaZOLAMIDE (DIAMOX) 250 MG tablet Take 1 tablet (250 mg) by mouth daily as needed (headache).      alirocumab (PRALUENT) 75 MG/ML SOPN injection  pen INJECT 1 ML (75 MG) UNDER THE SKIN EVERY 14 DAYS. 2 mL 3    clonazePAM (KLONOPIN) 0.5 MG tablet Take 0.5 tablets (0.25 mg) by mouth at bedtime. 15 tablet 0    Evolocumab 140 MG/ML SOAJ INJECT 140 MG UNDER THE SKIN EVERY 14 DAYS. 2 mL 3    guaiFENesin (MUCINEX) 600 MG Sustained-Release tablet Take 2 tablets (1,200 mg) by mouth 2 times daily. 30 tablet 0    oxymetazoline (AFRIN) 0.05 % nasal spray Spray 2 sprays into each nostril 2 times daily. Recommend limiting use to three days only 1 bottle 0    propranolol (INDERAL LA) 60 MG CP24 Take 1 capsule (60 mg) by mouth daily. 90 each 3    pseudoePHEDrine (SUDAFED 12 HOUR) 120 MG Controlled-Release tablet Take 1 tablet (120 mg) by mouth 2 times daily as needed for Congestion. 20 tablet 0    rosuvastatin (CRESTOR) 5 MG tablet Take 0.5 tablets (2.5 mg) by mouth three times a week. 30 tablet 1    tretinoin (RETIN-A) 0.025 % cream Use a pea sized amount on the entire face nightly.  At first, you may use every other night to avoid dryness and irritation, and gradually increase use to nightly as tolerated 45 g 3     No current facility-administered medications on file prior to visit.        Review of Systems:  CONSTITUTIONAL: Negative for fever or  chills  DERMATOLOGIC: Negative except as mentioned above in history.    Physical Exam:  SKIN TYPE: 2  GENERAL: Within normal limits.  NEURO: Alert and oriented x 3.  Moves all four 4 extremities spontaneously.  PSYCH: Mood and affect within normal limits.  EYES: Inspection of lids, sclera, and conjunctiva within normal limits.   MOUTH: Swelling and erythema of upper and lower mucosal lip. Erythema and fissuring of bilateral commissures of lips. No vesicles, bullae, erosions, or ulcerations noted.  RESPIRATORY: No respiratory distress.  EXTREMITIES: No cyanosis.  No edema.  No varicosities.      SKIN: Areas examined included forehead, cheeks, nose, lips, chin, ears, sclera, conjunctiva, eyelids, neck    All areas of skin examined negative except as mentioned above.      ASSESSMENT AND TREATMENT PLAN:    # Angular Cheilitis  Erythema and fissuring of bilateral commissures consistent with angular cheilitis. No remaining vesicles on exam today, however photographs on patient's phone with closed comedone-like appearance. May have been due to occlusion from patient applying various compounds to the lips, including hydrocortisone, antifungal cream, antibiotic cream, Blistex, and Carmex. Alternatively, may be allergic reaction to ingredient in lip balms. Less likely due to HSV due to atypical appearance and lack of pain.   - recommend keeping area clean and dry and avoiding licking lips  - recommend avoiding application of OTC topical antibiotic and antifungal creams  - recommend stopping Carmex and Blistex due to possible allergic reaction  - recommend plain Vaseline if lips are dry  - start ketoconazole 2% cream twice daily  - start desonide 0.05% cream twice daily    Follow up in 6 weeks    Ansel Bong, MD  Dermatology Resident, PGY-2  [facility]    Patient was seen and discussed with attending physician Dr. Olam Idler    ----------------------------------------------------------

## 2017-11-11 NOTE — Telephone Encounter (Signed)
From: [name], MD  To: Danella Deis, MD  Sent: 11/11/2017 9:10 AM PDT  Subject: 1-Non Urgent Medical Advice    Oops here is the attached photo

## 2017-11-11 NOTE — Patient Instructions (Signed)
-   recommend keeping area clean and dry and avoiding licking lips  - recommend avoiding application of OTC topical antibiotic and antifungal creams  - recommend stopping Carmex and Blistex due to possible allergic reaction  - recommend plain Vaseline if lips are dry  - start ketoconazole 2% cream twice daily  - start desonide 0.05% cream twice daily

## 2017-11-11 NOTE — Telephone Encounter (Signed)
11/11/2017 10:33 AM Phone (Outgoing) Greggory Keen, Sheral Apley, MD (Self) [phone]      Called and offered same-day appointment with Dr. Olam Idler, pt accepted.

## 2017-11-12 ENCOUNTER — Encounter (HOSPITAL_BASED_OUTPATIENT_CLINIC_OR_DEPARTMENT_OTHER): Payer: Self-pay | Admitting: Pain Medicine

## 2017-11-12 DIAGNOSIS — G9600 Cerebrospinal fluid leak, unspecified: Secondary | ICD-10-CM

## 2017-11-12 NOTE — Progress Notes (Signed)
Please review and approve. thanks

## 2017-11-14 ENCOUNTER — Other Ambulatory Visit: Payer: Self-pay

## 2017-11-15 NOTE — Telephone Encounter (Signed)
See My Chart message from pt .  Please advise ?

## 2017-11-29 ENCOUNTER — Encounter (HOSPITAL_BASED_OUTPATIENT_CLINIC_OR_DEPARTMENT_OTHER): Payer: Self-pay

## 2017-11-29 ENCOUNTER — Other Ambulatory Visit (HOSPITAL_BASED_OUTPATIENT_CLINIC_OR_DEPARTMENT_OTHER): Payer: Self-pay | Admitting: Pain Medicine

## 2017-11-29 DIAGNOSIS — R52 Pain, unspecified: Principal | ICD-10-CM

## 2017-12-02 ENCOUNTER — Encounter (HOSPITAL_BASED_OUTPATIENT_CLINIC_OR_DEPARTMENT_OTHER): Payer: Self-pay | Admitting: Pain Medicine

## 2017-12-02 ENCOUNTER — Ambulatory Visit
Admission: RE | Admit: 2017-12-02 | Discharge: 2017-12-02 | Disposition: A | Discharge status: Home / Self Care | Payer: No Typology Code available for payment source | Attending: Pain Medicine | Admitting: Pain Medicine

## 2017-12-02 ENCOUNTER — Encounter (HOSPITAL_BASED_OUTPATIENT_CLINIC_OR_DEPARTMENT_OTHER): Admission: RE | Disposition: A | Discharge status: Home Health | Payer: Self-pay | Attending: Pain Medicine

## 2017-12-02 ENCOUNTER — Ambulatory Visit: Payer: No Typology Code available for payment source

## 2017-12-02 DIAGNOSIS — E785 Hyperlipidemia, unspecified: Secondary | ICD-10-CM | POA: Insufficient documentation

## 2017-12-02 DIAGNOSIS — M5418 Radiculopathy, sacral and sacrococcygeal region: Secondary | ICD-10-CM | POA: Insufficient documentation

## 2017-12-02 DIAGNOSIS — M5416 Radiculopathy, lumbar region: Secondary | ICD-10-CM | POA: Insufficient documentation

## 2017-12-02 DIAGNOSIS — M961 Postlaminectomy syndrome, not elsewhere classified: Secondary | ICD-10-CM | POA: Insufficient documentation

## 2017-12-02 DIAGNOSIS — R52 Pain, unspecified: Secondary | ICD-10-CM

## 2017-12-02 DIAGNOSIS — Z888 Allergy status to other drugs, medicaments and biological substances status: Secondary | ICD-10-CM | POA: Insufficient documentation

## 2017-12-02 SURGERY — PAIN ESI CAUDAL WITH IMAGING
Anesthesia: Local

## 2017-12-02 MED ORDER — IOHEXOL 240 MG/ML IJ SOLN
INTRAMUSCULAR | Status: DC
Start: 2017-12-02 — End: 2017-12-02
  Filled 2017-12-02: qty 10

## 2017-12-02 MED ORDER — IOHEXOL 240 MG/ML IJ SOLN
INTRAMUSCULAR | Status: DC | PRN
Start: 2017-12-02 — End: 2017-12-02
  Administered 2017-12-02 (×2): 1 mL via EPIDURAL

## 2017-12-02 MED ORDER — DEXAMETHASONE SODIUM PHOSPHATE 10 MG/ML IJ SOLN
INTRAMUSCULAR | Status: DC
Start: 2017-12-02 — End: 2017-12-02
  Filled 2017-12-02: qty 1

## 2017-12-02 MED ORDER — DEXAMETHASONE SODIUM PHOSPHATE 10 MG/ML IJ SOLN
INTRAMUSCULAR | Status: DC | PRN
Start: 2017-12-02 — End: 2017-12-02
  Administered 2017-12-02: 5 mg via EPIDURAL

## 2017-12-02 SURGICAL SUPPLY — 6 items
APPLICATOR CHLORAPREP 3ML, CLEAR (Misc Medical Supply) ×2
GLOVE BIOGEL PI ULTRATOUCH SIZE 7 (Gloves/gowns) ×2 IMPLANT
GLOVE BIOGEL PI ULTRATOUCH SIZE 7.5 (Gloves/gowns) IMPLANT
GLOVE BIOGEL PI ULTRATOUCH SIZE 8 (Gloves/gowns)
MARKER SECURELINE SURG SKIN (Misc Medical Supply) ×2 IMPLANT
TRAY SINGLE SHOT EPIDURAL (Procedure Packs/kits) ×2

## 2017-12-02 NOTE — Op Note (Signed)
Procedure Note, [facility]    Preoperative Diagnosis: Lumbar radiculopathy and Post laminectomy syndrome lumbar    Postoperative Diagnosis: Lumbar radiculopathy and Post laminectomy syndrome lumbar    Procedure:  1. Caudal epidural steroid injection    2.   Fluoroscopy for needle guidance.  3.  Lumbar epidurography.     Surgeon/Staff:  Peter Minium, MD  Assistant: Alcide Clever, MD     Anesthesia:  None    Fluoroscopy Time:   4     seconds                         Indications: The patient c/o  Axial Low Back Pain and Lower Extremity Pain. The patient's history and physical findings are consistent with Lumbar radiculopathy and Post laminectomy syndrome lumbar. They are here for an injection at the site thought to be the source of their pain.  The patient has benefited from similar injections in the past.    Procedure in detail:  Written informed consent was obtained.  The chart was reviewed, questions were answered and the patient wished to proceed. The patient had no contraindications to the procedure.  Vital signs were stable. Standard monitoring was applied. The patient, the procedure nurse, and the physician confirmed the site of injection after an official "time out." The skin over the injection site was also marked and confirmed as the site of the injection.    Localization Time Out:  An additional intraoperative timeout, specifically to confirm accurate localization, was conducted by the attending physician.     The patient remained awake and alert throughout the procedure.  The patient was placed in the prone position.  The skin was prepped with chlorhexidine and sterile drapes were applied. A 27 gauge 1.25 inch needle needle was inserted percutaneously and advanced under fluoroscopic guidance using  AP, and lateral projections.  The needle tip entered via the sacral hiatus into the lumbosacral space. No paraesthesias occurred and aspiration was negative   Epidurography and radiographic  interpretation: Epidurography was performed by injection of Omnipaque 240 under live fluoroscopy.  Multiple Xray images were obtained.  Radiologic examination confirmed proper spread of the contrast medium within the epidural space.  There was no evidence of intrathecal or intravascular runoff.    The needle was injected with Dexamethasone (10mg /mL)  1 mL, Preservative free normal saline 8 mL and Lidocaine (1%)  1 mL.  The needle was removed.  A sterile bandage was applied.  The patient did not experience any hemodynamic or neurologic sequelae.  The attending physician was present during all critical points of the procedure.    The patient was transferred to the recovery area. Post operative instructions were explained and given to the  patient. The patient was discharged in stable health and given a follow up appt.with Attending in 4 week(s).

## 2017-12-02 NOTE — Discharge Instructions (Signed)
Patient verbalized understanding of below instructions    Procedure Done Today: caudal epidural injection    Return to Pain CLinic in one month to see Dr Thereasa Parkin    Post Procedure Instructions  Continue present medication unless otherwise indicated.  Resume your normal diet after being discharge.  Ice pack to treatment site (no more than 20 minutes at a time) if needed for pain relief and/or muscle spasm.  Avoid strenuous activities and driving until tomorrow morning.  If you have a band-aid dressing, you may remove it tomorrow morning.  Resume normal activities tomorrow morning, unless otherwise directed.  No submersion in water for 24 hrs.  If your next appointment is at the Pain Clinic, then please call [phone] to make an appointment.  If your next appointment is another procedure, then please call [phone] to make an appointment.    Call the [facility],  [phone] and ask for the pain management provider on call for ANY sign of:  Temperature above 101.5 F  Redness or drainage at the treatment site  Severe, uncontrollable headache    Call [place]

## 2017-12-02 NOTE — H&P (Signed)
Ambulatory Surgery/Invasive Procedure History and Physical      Primary Care Physician [name]    Chief Complaint:  low back and leg pain     37 year old male here for scheduled procedure.    BP 144/86   Temp 98.2 F (36.8 C)   Resp 16   SpO2 100%     Past Medical History:   Diagnosis Date   . Acne    . Back pain     secondary to herniated disc, from back injury in 2000   . Hyperhidrosis     propanolol tx   . Hyperlipidemia     LDL > 200, TG ~400 at Dx, statin intolerant.  Per pt, r/o for familial HDL   . Parasomnia associated with sexual behavior     Tx with Klonapin, has been being downtitrated       Allergies   Allergen Reactions   . Atorvastatin Other     Debilitating myalgias   . Pravastatin Other     Debilitating myalgias   . Rosuvastatin Other     Debilitating myalgias   . Zetia [Ezetimibe] Other     Debilitating myalgias       Current Facility-Administered Medications   Medication Dose Route Frequency Provider Last Rate Last Dose   . dexamethasone (DECADRON) 10 MG/ML 10 mg/mL injection  - ADS OVERRIDE            . iohexol (OMNIPAQUE 240) 240 MG/ML solution  - ADS OVERRIDE                I have reviewed the past medical history, allergies and current medications as documented in the electronic health record.      Physical Exam       Can this patient make their own healthcare decisions?  Yes    Chest:  Breaths easily     Heart:  RRR    Abdomen:  Soft    Pain Management Needs/Options discussed.    No Advanced Directives.      Resuscitative Status:  Full Code, Full Care    Diagnosis:    ICD-10-CM ICD-9-CM    1. Post laminectomy syndrome M96.1 722.80     Added automatically from request for surgery [id number]   2. Lumbar radiculopathy, chronic M54.16 724.4     Added automatically from request for surgery [id number]   3. Sacral radiculopathy M54.18 724.4     Added automatically from request for surgery [id number]   4. Pain R52 780.96        Procedure: Caudal epidural steroid injection    The procedure is  designed to be therapeutic.    Discussed Risks, Benefits, and Alternatives to procedure.  Questions answered.  Patient voiced understanding and wished to proceed. Consent Signed.    See procedure note of same date.

## 2017-12-10 ENCOUNTER — Telehealth (HOSPITAL_COMMUNITY): Payer: Self-pay | Admitting: Cardiovascular Disease

## 2017-12-10 NOTE — Telephone Encounter (Signed)
Called [facility] regarding [place] faxed letters.  Patient is supposed to be on Praluent per Dr. Roberto Scales order.  Pharmacy needs to verify prescription and will be contacting patient for billing and shipment of the [place].    Routed to Dr. Cherylann Ratel as Juluis Rainier.    Alita Chyle, RN, BSN  [place]

## 2017-12-17 ENCOUNTER — Encounter (INDEPENDENT_AMBULATORY_CARE_PROVIDER_SITE_OTHER): Payer: Self-pay | Admitting: Dermatology

## 2017-12-17 NOTE — Telephone Encounter (Signed)
From: [name], MD  To: Cher Nakai Ryoko, MD  Sent: 12/17/2017 7:00 AM PDT  Subject: 1-Non Urgent Medical Advice    Hi Dr. Olam Idler, I had a spot on my eyelid removed by Trish Fountain (supposedly it was pre-cancerous). Attached is a photo of a new spot. Is this another spot that warrants removal or just acne?    Thanks,  Erik Burke

## 2017-12-18 NOTE — Telephone Encounter (Signed)
From: [name], MD  To: Cher Nakai Ryoko, MD  Sent: 12/17/2017 10:02 PM PDT  Subject: 1-Non Urgent Medical Advice    Hi Apolonio Schneiders thanks for your help. I'm available in the 12:30-2:45 window on Thursday 9/12 (any doc is fine). Also, 12:30-1:40 this Thursday 9/5 works. If those times don't work, I'm happy to call and schedule.     Gale Journey    ----- Message -----  From: Marya Amsler, RN  Sent: 12/17/17, 08:46  To: [name], MD  Subject: RE: 1-Non Urgent Medical Advice    Hello Dr. Greggory Keen,    Thank you for your message. Our providers request to evaluate any new lesions in person at this time. Please feel free to call and schedule a follow up appointment at [phone]. Or, let me know if a certain day or times work better for you and I can assist.     Thank you,    Apolonio Schneiders, RN    ----- Message -----   From: [name], MD   Sent: 12/17/2017 7:00 AM PDT   To: Stefanie Libel, MD  Subject: 1-Non Urgent Medical Advice    Hi Dr. Olam Idler, I had a spot on my eyelid removed by Trish Fountain (supposedly it was pre-cancerous). Attached is a photo of a new spot. Is this another spot that warrants removal or just acne?    Thanks,  Erik Burke

## 2017-12-19 ENCOUNTER — Ambulatory Visit (INDEPENDENT_AMBULATORY_CARE_PROVIDER_SITE_OTHER): Payer: No Typology Code available for payment source | Admitting: Neurosurgery

## 2017-12-19 ENCOUNTER — Other Ambulatory Visit: Payer: Self-pay

## 2017-12-19 ENCOUNTER — Telehealth (INDEPENDENT_AMBULATORY_CARE_PROVIDER_SITE_OTHER): Payer: Self-pay | Admitting: Neurosurgery

## 2017-12-19 ENCOUNTER — Encounter (INDEPENDENT_AMBULATORY_CARE_PROVIDER_SITE_OTHER): Payer: Self-pay | Admitting: Neurosurgery

## 2017-12-19 VITALS — BP 131/81 | HR 70 | Temp 98.0°F | Resp 18 | Ht 68.0 in | Wt 170.0 lb

## 2017-12-19 DIAGNOSIS — G9600 Cerebrospinal fluid leak, unspecified: Secondary | ICD-10-CM

## 2017-12-19 NOTE — Telephone Encounter (Signed)
Patient was scheduled

## 2017-12-19 NOTE — Telephone Encounter (Signed)
Patient is calling requesting to speak with MD regarding an outside referral to [facility]. Reached out to [place] who helped confirmed MD is recommending for pt to have consult with Ophthalmology first, then CT Myelogram with Dr. Leodis Liverpool prior to [facility] referral. Informed patient of this, he verbalized understanding.     Patient would like to request meanwhile he schedules opthalmology appt and CT if request for authorization can be submitted to his insurance for [facility] referral as he states HMO's takes a while to approve referrals.    Also, he would like to confirm with MD if it's possible to schedule consult with Dr. Leodis Liverpool first prior to CT as patient recalls Dr. Cherrie Gauze recommending consult first. He is kindly requesting a call back.    Please advise.      P) [phone]

## 2017-12-19 NOTE — Progress Notes (Deleted)
Error

## 2017-12-20 ENCOUNTER — Encounter (INDEPENDENT_AMBULATORY_CARE_PROVIDER_SITE_OTHER): Payer: Self-pay

## 2017-12-20 ENCOUNTER — Encounter (INDEPENDENT_AMBULATORY_CARE_PROVIDER_SITE_OTHER): Payer: Self-pay | Admitting: Neurological Surgery

## 2017-12-20 NOTE — Progress Notes (Signed)
Neurosurgery Outpatient Consult Note    Attending MD:   Dr. 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  Pain is worst with sitting down and improved with lying down or standing up/walking.  He also notes some low back / coccix pain that has occurred within the same time frame.  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  He is also unable to participate in sports he had previously (eg, boxing) due to this pain.  He is currently taking diamox though it is unclear whether this has been of help to him.    Patient denies weakness.  No numbness.  No radicular pain, bowel/bladder incontinence, or saddle anesthesia.  No change in vision or other special sense.    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  During this time he underwent multiple glue treatments, as well as an L2/3 laminectomy for instability caused by repeated ESIs and other IR procedures.  His last myelogram per his report was approximately five years ago (2013).  He has not been worked up by [employer] per his report.    Of note, patient has discussed his case with multiple outside providers, including Dr Trinna Balloon [facility]) and his former physicians at [facility].  There is active discussion as to whether his symptoms represent a high pressure state or low pressure state, for which a myelogram was recommended for workup.  He is planning on seeing Dr. Trinna Balloon, but has not been approved by insurance reasons to see Dr. Trinna Balloon  without a [place] neurosurgery appointment and per pt's report will need any imaging done at [facility].    Past Medical and Surgical History:  Past Medical History:   Diagnosis Date   . Acne    . Back pain     secondary to herniated disc, from back injury in 2000   . Hyperhidrosis     propanolol tx   . Hyperlipidemia     LDL > 200, TG ~400 at Dx, statin intolerant.  Per pt, r/o for familial HDL   . Parasomnia associated with sexual behavior     Tx with Caprice Renshaw, has been being downtitrated     Past Surgical History:   Procedure Laterality Date   . epidural steroid injection      x5 ESIs, most ecent 2017   . l3/4 laminectomy  2014    @ [place], 2014       Allergies:  Allergies   Allergen Reactions   . Atorvastatin Other     Debilitating myalgias   . Pravastatin Other     Debilitating myalgias   . Rosuvastatin Other     Debilitating myalgias   . Zetia [Ezetimibe] Other     Debilitating myalgias       Medications:  Current Outpatient Medications   Medication Sig   . acetaZOLAMIDE (DIAMOX) 250 MG tablet Take 1 tablet (250 mg) by mouth daily as needed (headache).   Marland Kitchen  alirocumab (PRALUENT) 75 MG/ML SOPN injection pen INJECT 1 ML (75 MG) UNDER THE SKIN EVERY 14 DAYS.   Marland Kitchen clonazePAM (KLONOPIN) 0.5 MG tablet Take 0.5 tablets (0.25 mg) by mouth at bedtime.   Marland Kitchen desonide (DESOWEN) 0.05 % cream Apply 1 Application topically 2 times daily. apply to affected areas   . Evolocumab 140 MG/ML SOAJ INJECT 140 MG UNDER THE SKIN EVERY 14 DAYS.   Marland Kitchen guaiFENesin (MUCINEX) 600 MG Sustained-Release tablet Take 2 tablets (1,200 mg) by mouth 2 times daily.   Marland Kitchen ketoconazole (NIZORAL) 2 % cream Apply 1 Application topically 2 times daily. Use a small amount as directed   . oxymetazoline (AFRIN) 0.05 % nasal spray Spray 2 sprays into each nostril 2 times daily. Recommend limiting use to three days only   . propranolol (INDERAL LA) 60 MG CP24 Take 1 capsule (60 mg) by mouth daily.   . pseudoePHEDrine (SUDAFED 12 HOUR) 120 MG Controlled-Release tablet Take 1  tablet (120 mg) by mouth 2 times daily as needed for Congestion.   . rosuvastatin (CRESTOR) 5 MG tablet Take 0.5 tablets (2.5 mg) by mouth three times a week.   . tretinoin (RETIN-A) 0.025 % cream Use a pea sized amount on the entire face nightly.  At first, you may use every other night to avoid dryness and irritation, and gradually increase use to nightly as tolerated     No current facility-administered medications for this visit.        Social History:  Social History     Socioeconomic History   . Marital status: Married     Spouse name: Not on file   . Number of children: Not on file   . Years of education: Not on file   . Highest education level: Not on file   Occupational History   . Not on file   Tobacco Use   . Smoking status: Never Smoker   . Smokeless tobacco: Never Used   Substance and Sexual Activity   . Alcohol use: Yes     Comment: 1-3 drinks infrequently   . Drug use: Not on file   . Sexual activity: Not on file   Social Activities of Daily Living Present   . Not on file   Social History Narrative    PhD in Psychology, works in [employer], [place] [employer]       Family History:  Non-contributory     Review of Systems:  Per HPI, All other review of systems were discussed and reported negative    Physical Exam:  BP 131/81 (BP Location: Right arm, BP Patient Position: Sitting, BP cuff size: Regular)   Pulse 70   Temp 98 F (36.7 C) (Oral)   Resp 18   Ht 5\' 8"  (1.727 m)   Wt 77.1 kg (170 lb)   SpO2 97%   BMI 25.85 kg/m     Awake, alert, oriented x4  PERRL EOMI  Vision intact to central finger count  Face sensation and strength symmetric  Tongue midline  5/5 strength throughout  No pronator drift  SILT  2+ biceps/patellars, no hoffman or clonus    Labs and Other Data:  Lab Results   Component Value Date    BUN 14 07/15/2017    CREAT 1.04 07/15/2017    CL 101 07/15/2017    NA 137 07/15/2017    K 4.5 07/15/2017     9.7 07/15/2017    BICARB 24 07/15/2017    GLU 91 07/15/2017  Lab Results      Component Value Date    WBC 4.4 02/11/2016    RBC 5.04 02/11/2016    HGB 15.5 02/11/2016    HCT 44.9 02/11/2016    MCV 89.1 02/11/2016    MCHC 34.5 02/11/2016    RDW 11.5 (L) 02/11/2016    PLT 245 02/11/2016    MPV 9.3 (L) 02/11/2016    LYMPHS 42 02/11/2016    MONOS 10 02/11/2016    EOS 6 02/11/2016    BASOS 1 02/11/2016       No results found for: A1C    Imaging:  MRI Lspine, per my read, with sequelae of L2/3 laminectomy without focal evidence of CSF leak  MRI brain, per my read, 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  Will require additional workup of new symptom etiology as part of consult with Dr Trinna Balloon [facility]), who per pt report will only see pt with completed imaging.    - NeuroIR referral to Dr. Leodis Liverpool for CT vs MRI myleogram  - Ophtho eval to eval papilledema in setting of diamox use  - Will review above workup when completed and forward to Dr Trinna Balloon for further neurosurgical evaluation  - Continue pain control  - All questions answered    The above plan was discussed with attending Dr Cherrie Gauze.    Jarold Song MD  [facility]  Contact through [place] pager: [place], [place]

## 2017-12-23 NOTE — Progress Notes (Signed)
Erik Loose, MD, MS [name]  Attending Neurosurgeon    I personally saw and examined patient with Dr. Penne Lash.  Agree with their excellent note.    Please see attached note for details of history and physical examination.    I spent 45 minutes face to face with the patient.  Greater than 50% of this visit was spent discussing referral to Dr. Leodis Liverpool for CT myelogram.     Ophthalmology referral placed.     Images were reviewed in detail.    Conservative management, natural history, and treatment options were further discussed in detail.

## 2017-12-24 NOTE — Telephone Encounter (Signed)
Routed to Keri O'Connor, NP and Dr. Pannell  to review and advise.

## 2017-12-24 NOTE — Telephone Encounter (Signed)
Message relayed directly to Dr. Leodis Liverpool.

## 2017-12-25 ENCOUNTER — Encounter (HOSPITAL_BASED_OUTPATIENT_CLINIC_OR_DEPARTMENT_OTHER): Payer: Self-pay | Admitting: Pain Medicine

## 2017-12-25 ENCOUNTER — Ambulatory Visit (INDEPENDENT_AMBULATORY_CARE_PROVIDER_SITE_OTHER): Payer: No Typology Code available for payment source | Admitting: Dermatology

## 2017-12-26 ENCOUNTER — Other Ambulatory Visit: Payer: Self-pay

## 2017-12-26 ENCOUNTER — Ambulatory Visit: Payer: No Typology Code available for payment source | Attending: Pain Medicine | Admitting: Pain Medicine

## 2017-12-26 ENCOUNTER — Encounter (HOSPITAL_BASED_OUTPATIENT_CLINIC_OR_DEPARTMENT_OTHER): Payer: Self-pay | Admitting: Pain Medicine

## 2017-12-26 VITALS — BP 114/77 | HR 74 | Temp 98.3°F

## 2017-12-26 DIAGNOSIS — G9600 Cerebrospinal fluid leak, unspecified: Secondary | ICD-10-CM

## 2017-12-26 DIAGNOSIS — M961 Postlaminectomy syndrome, not elsewhere classified: Secondary | ICD-10-CM | POA: Insufficient documentation

## 2017-12-26 DIAGNOSIS — M5418 Radiculopathy, sacral and sacrococcygeal region: Principal | ICD-10-CM | POA: Insufficient documentation

## 2017-12-26 NOTE — Progress Notes (Signed)
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     The patient reports that the procedure worked well for him and took his pain down from a 8 to a 2-4. The pain is mainly in his coccyx and sacral area, and occasionally radiates to his hips. He reports no numbness or weakness, no incontinence. He has had a few ESI's in the past and is concerned about whether he can continue to receive these and what are the long term effects.      He is now being followed by Neurosurgery for his retroorbital pressure, and he also started taking acetazolamide, which seems to be helping. He reports that standing up and walking around makes his headache better.    Current Description of Symptoms:   On the pain diagram today the patient shades in the areas of their coccygeal region.  Since the last visit in Pain Clinic patient reports 50% improvement in their pain from pain procedures.  Patient reports N/A improvement in their pain with use of their current pain medications.  The patient has not seen a physical therapist to treat the current problem.   Over the last 12 months, they have done 0 sessions and over the last 6 months they have done 0 sessions.   The patient is doing a home exercise program.  They describe their pain as pressure, aching, radiating, burning, sharp, stabbing, pinching and dull. Patient states their pain is associated with none. This pain has made it hard for the patient to walk and sit.    The patient denies saddle anesthesia or bowel or bladder incontinence.    The patient stated their pain today is Pain Score: 2/10.   Over the past week the patient's pain has  been at its worst 4/10, at best 0/10 and averages 2/10.   During the past week, it has interfered with enjoyment of life 2/10 and general activity 2/10.    Pain Treatments  Previous pain procedures, dates, and response include:   - Caudal ESI 12/02/17: 50% relief sustained at 1 month with functional improvement  - Previous caudal ESI's with relief   Patient is currently using the following pain medications:  - None     Past Medical History:   Diagnosis Date   . Acne    . Back pain     secondary to herniated disc, from back injury in 2000   . Hyperhidrosis     propanolol tx   . Hyperlipidemia     LDL > 200, TG ~400 at Dx, statin intolerant.  Per pt, r/o for familial HDL   . Parasomnia associated with sexual behavior     Tx with Caprice Renshaw, has been being downtitrated     Past Surgical History:   Procedure Laterality Date   . epidural steroid injection      x5 ESIs, most ecent 2017   . l3/4 laminectomy  2014    @ [facility], 2014     Current Outpatient Medications   Medication Sig Dispense Refill   . acetaZOLAMIDE (DIAMOX) 250 MG tablet Take 1 tablet (250 mg) by mouth daily as needed (headache).     Marland Kitchen  alirocumab (PRALUENT) 75 MG/ML SOPN injection pen INJECT 1 ML (75 MG) UNDER THE SKIN EVERY 14 DAYS. 2 mL 3   . clonazePAM (KLONOPIN) 0.5 MG tablet Take 0.5 tablets (0.25 mg) by mouth at bedtime. 15 tablet 0   . desonide (DESOWEN) 0.05 % cream Apply 1 Application topically 2 times daily. apply to affected areas 15 g 1   . Evolocumab 140 MG/ML SOAJ INJECT 140 MG UNDER THE SKIN EVERY 14 DAYS. 2 mL 3   . guaiFENesin (MUCINEX) 600 MG Sustained-Release tablet Take 2 tablets (1,200 mg) by mouth 2 times daily. 30 tablet 0   . ketoconazole (NIZORAL) 2 % cream Apply 1 Application topically 2 times daily. Use a small amount as directed 1 Tube 1   . oxymetazoline (AFRIN) 0.05 % nasal spray Spray 2 sprays into each nostril 2 times daily. Recommend limiting use to three days only 1 bottle 0   . propranolol (INDERAL LA) 60 MG CP24 Take 1  capsule (60 mg) by mouth daily. 90 each 3   . pseudoePHEDrine (SUDAFED 12 HOUR) 120 MG Controlled-Release tablet Take 1 tablet (120 mg) by mouth 2 times daily as needed for Congestion. 20 tablet 0   . rosuvastatin (CRESTOR) 5 MG tablet Take 0.5 tablets (2.5 mg) by mouth three times a week. 30 tablet 1   . tretinoin (RETIN-A) 0.025 % cream Use a pea sized amount on the entire face nightly.  At first, you may use every other night to avoid dryness and irritation, and gradually increase use to nightly as tolerated 45 g 3     No current facility-administered medications for this visit.      Allergies   Allergen Reactions   . Atorvastatin Other     Debilitating myalgias   . Pravastatin Other     Debilitating myalgias   . Rosuvastatin Other     Debilitating myalgias   . Zetia [Ezetimibe] Other     Debilitating myalgias     Social History     Socioeconomic History   . Marital status: Married     Spouse name: Not on file   . Number of children: Not on file   . Years of education: Not on file   . Highest education level: Not on file   Occupational History   . Not on file   Social Needs   . Financial resource strain: Not on file   . Food insecurity:     Worry: Not on file     Inability: Not on file   . Transportation needs:     Medical: Not on file     Non-medical: Not on file   Tobacco Use   . Smoking status: Never Smoker   . Smokeless tobacco: Never Used   Substance and Sexual Activity   . Alcohol use: Yes     Comment: 1-3 drinks infrequently   . Drug use: Not on file   . Sexual activity: Not on file   Lifestyle   . Physical activity:     Days per week: Not on file     Minutes per session: Not on file   . Stress: Not on file   Relationships   . Social connections:     Talks on phone: Not on file     Gets together: Not on file     Attends religious service: Not on file     Active member of club or organization: Not on file     Attends meetings  of clubs or organizations: Not on file     Relationship status: Not on file   .  Intimate partner violence:     Fear of current or ex partner: Not on file     Emotionally abused: Not on file     Physically abused: Not on file     Forced sexual activity: Not on file   Other Topics Concern   . Not on file   Social History Narrative    PhD in Psychology, works in [employer], [place] [employer]     Family History   Problem Relation Name Age of Onset   . Cancer Mother          cervical   . No Known Problems Father     . Cancer Maternal Grandmother          ovarian   . No Known Cancer Maternal Grandfather     . Heart Disease Maternal Grandfather     . Cancer Paternal Grandmother          pancreatic   . Cancer Paternal Grandfather          mesothelioma   . Cancer Brother  17        osteosarcoma       Review of Systems  Constitutional: Negative  Eyes: Negative  ENT: Negative  Cardiac: Negative  Pulmonary: Negative  Gastrointestional: Negative  Musculoskeletal: Negative  Skin: Negative  Neurologic: Negative  Psychiatric: Negative  Remainder of complete ROS is negative except as above and scanned under Media.    Physical Exam:   Vitals: BP 114/77 (BP Location: Left arm, BP Patient Position: Sitting, BP cuff size: Regular)   Pulse 74   Temp 98.3 F (36.8 C) (Oral)   SpO2 100%   Constitutional: Vital signs listed above. Well-developed, well-nourished, and no distress  Psych: alert, awake and oriented, oriented. Speech is clear/ normal. Affect is euthymic.  Eyes: Sclera white, conjunctiva clear, lids are without lag. Pupils equal, not pinpoint.  ENT: Oropharynx clear and moist without erythema. Gums pink, good dentition.  CV:  Skin warm and dry. No lower extremity edema.  Respiratory:  Breathing easily without tachypnea or bradypnea. Not using accessory muscles.  GI/Abdomen: Soft, non-tender, non-distended.  Skin: Skin color, texture, turgor normal. No rashes or lesions.  Musculoskeletal: No occipital, paracervical, or shoulder muscle tenderness . Tenderness to palpation in coccygeal region.    Neurological:  Mental Status; Awake, alert, oriented  Cranial Nerves: II-XII grossly intact  Motor: Normal bulk and tone.   Coordination: No gross axial or appendicular ataxia.  Sensory: Intact to light touch  Gait: Pt is able to raise from a seated position without difficulty. Gait  is not antalgic and the patient ambulates without assistance.     Labs and Imaging:  MRI Lumbar Spine, 07/15/17  IMPRESSION:  1. Interval postoperative changes compatible with discectomy and posterior decompression at L2-L3 with marked improvement of the prior spinal canal stenosis at this level. No focal collection or specific evidence of leak is identified.  2. Interval subtle smooth linear signal abnormality/T2 hypointensity involving the caudal aspect of the thecal sac with subtle enhancement, which may be associated with siderosis as well subtle reactive change, given prior intervention and concurrent MRI brain findings.  3. Degenerative changes of the lumbar spine at L3-L4 and L4-L5 with up to moderate spinal canal stenosis at L3-L4 and moderate bilateral neural foraminal stenosis at L3-L4 and L4-L5.    Assessment:  Encounter Diagnoses  Name Primary?   . Sacral radiculopathy Yes   . CSF leak    . 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 Patient with significant improvement in pain after caudal epidural steroid injection. Unclear etiology of coccygeal pain, although possibly due to history of blood patches causing siderosis.     PLAN:   - Repeat caudal ESI's q3 months as needed   - Followed by Neurosurgery/Neuro IR for history of CSF leaks and retro-orbital pressure       Follow-up: PRN     Co-written by Joni Reining, MSIV and Peter Minium, MD

## 2017-12-26 NOTE — Progress Notes (Signed)
Attending Note:     Subjective:   I reviewed the history.   Patient interviewed and examined.   History of present illness (HPI): This is a 37 year old male who has a past medical history of Acne, Back pain, Hyperhidrosis, Hyperlipidemia, and Parasomnia associated with sexual behavior. he is here today with chronic sacral nerve root pain in coccygeal and perineal regions here for follow-up.  Review of Systems (ROS):  Review of Systems  Constitutional: Negative  Eyes: Negative  ENT: Negative  Cardiac: Negative  Pulmonary: Negative  Gastrointestional: Negative  Musculoskeletal: Negative  Skin: Negative  Neurologic: Negative  Psychiatric: Negative  Past Medical, Family, Social History: As per the med student's note.   Objective:   I have examined the patient and I concur with the med student's exam, edited by me, as documented.   Assessment and plan reviewed with the med student.   I agree with the med student's plan, edited by me, as documented.     Encounter Diagnoses   Name Primary?   . Sacral radiculopathy Yes   . CSF leak    . Post laminectomy syndrome        Theotis was seen today for pain.    Diagnoses and all orders for this visit:    Sacral radiculopathy  Overview:  Added automatically from request for surgery [id number]    Orders:  -     Case Request: PAIN ESI CAUDAL WITH IMAGING; Standing    CSF leak    Post laminectomy syndrome  Overview:  Added automatically from request for surgery [id number]    Orders:  -     Case Request: PAIN ESI CAUDAL WITH IMAGING; Standing      I have examined the patient, discussed the findings, reviewed the plan, and answered all questions with the patient.  See the med student's note, edited by me, for further details.    Peter Minium, MD   Pain Neurologist  Assistant [facility] for Pain Medicine  Department of Anesthesiology  [place]    More than 25 min was spent with the patient today, of which more than 50% was spent in counseling regarding the  diagnosis, prognosis, further diagnostic and therapeutic options and coordinating this care.  There were no barriers to learning.

## 2017-12-29 ENCOUNTER — Encounter (HOSPITAL_BASED_OUTPATIENT_CLINIC_OR_DEPARTMENT_OTHER): Payer: Self-pay

## 2018-01-05 NOTE — Progress Notes (Signed)
Attending MD:   Dr. 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  Patient endorses history of dural tear/CSF leak, treated with glue, blood patching and ESI in ~ 2010.  Patient was doing well until approximately one year ago.    Recent Treatment History  12/02/2017: Caudal epidural steroid injection w/Schuster  10/01/2017: Bilateralsphenopalatine ganglion nerve block w/ Thereasa Parkin  10/04/2016: Ganglion impar saccrococcxygeal ligament nerve block w/ [facility]    Today Mr. Noffsinger notes orbital pressure like sensation over the last year, which is worse with sitting and improved with lying flat (on the side) or standing/walking.  The orbital pressure is correlated with increased sacral pain.  Patient notes symptoms also worse with ETOH intake.  Denies further numbness, tingling and weakness to all extremities.    Mr. Holness has consulted with providers at [facility] and Diamox therapy was initiated - 250mg  qD.  Patient would like to increase this dose and will follow up with [facility] provider.    Current Medications:     acetaZOLAMIDE (DIAMOX) 250 MG tablet Take 1 tablet (250 mg) by mouth daily as needed (headache).   alirocumab (PRALUENT) 75 MG/ML SOPN injection pen INJECT 1 ML (75 MG) UNDER THE SKIN EVERY 14 DAYS.   clonazePAM (KLONOPIN) 0.5 MG tablet Take 0.5 tablets (0.25 mg) by mouth at bedtime.   desonide (DESOWEN) 0.05 % cream Apply 1 Application topically 2 times daily. apply to affected areas   Evolocumab 140 MG/ML SOAJ INJECT 140 MG UNDER THE SKIN EVERY 14 DAYS.   guaiFENesin (MUCINEX) 600 MG Sustained-Release tablet Take 2 tablets (1,200 mg) by mouth 2 times daily.   ketoconazole (NIZORAL) 2 % cream Apply 1 Application topically 2 times daily. Use a small amount as directed   oxymetazoline (AFRIN)  0.05 % nasal spray Spray 2 sprays into each nostril 2 times daily. Recommend limiting use to three days only   propranolol (INDERAL LA) 60 MG CP24 Take 1 capsule (60 mg) by mouth daily.   pseudoePHEDrine (SUDAFED 12 HOUR) 120 MG Controlled-Release tablet Take 1 tablet (120 mg) by mouth 2 times daily as needed for Congestion.   rosuvastatin (CRESTOR) 5 MG tablet Take 0.5 tablets (2.5 mg) by mouth three times a week.   tretinoin (RETIN-A) 0.025 % cream Use a pea sized amount on the entire face nightly.  At first, you may use every other night to avoid dryness and irritation, and gradually increase use to nightly as tolerated       Surgical History:   Veto  has a past surgical history that includes epidural steroid injection and l3/4 laminectomy (2014).     Past Medical History:  Past Medical History:   Diagnosis Date   . Acne    . Back pain     secondary to herniated disc, from back injury in 2000   . Hyperhidrosis     propanolol tx   . Hyperlipidemia     LDL > 200, TG ~400 at Dx, statin intolerant.  Per pt, r/o for familial HDL   . Parasomnia associated with sexual behavior     Tx with Caprice Renshaw, has been being downtitrated       Social History:  Social History     Socioeconomic  History   . Marital status: Married     Spouse name: Not on file   . Number of children: Not on file   . Years of education: Not on file   . Highest education level: Not on file   Occupational History   . Not on file   Tobacco Use   . Smoking status: Never Smoker   . Smokeless tobacco: Never Used   Substance and Sexual Activity   . Alcohol use: Yes     Comment: 1-3 drinks infrequently   . Drug use: Not on file   . Sexual activity: Not on file   Social Activities of Daily Living Present   . 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    Review of Systems:  Per HPI, All other review of  systems were discussed and reported negative    Examination:    01/06/18  1147   BP: 138/81   Pulse: 92   Resp: 18   Temp: 98.2 F (36.8 C)   SpO2: 100%     General: The patient is in no acute distress.   HEENT: Head is normocephalic, atraumatic   Neck: Supple, non tender c-spine, trachea is midline.   Extremities: No clubbing, cyanosis, or edema  Neurological examination:   Mental status: The patient is alert & oriented x3.   Intact recent and remote memory. Appropriate mood and affect.   Normal attention span and concentration.   Speech and language intact, no hoarseness or stridor, conversant without signs of aphasia or neglect. Good fund of knowledge.     Cranial Nerves: Pupils are equal, round and reactive to light and accommodation bilaterally, extraocular movements intact, no nystagmus noted, visual field intact to confrontation.  Face is symmetric, no ptosis noted  Tone normal, no tremor. No muscle atrophy noted  MOTOR STRENGTH                  Upper extremeties: RUE LUE Lower extremities: RLE LLE   Deltoid 5/5 5/5 Iliopsoas 5/5 5/5   Biceps 5/5 5/5 Quadriceps 5/5 5/5   Triceps 5/5 5/5 Biceps Femoris 5/5 5/5   Wrist extension 5/5 5/5            Hand grip 5/5 5/5 Tibialis anterior 5/5 5/5   PIP extension 5/5 5/5 Gastrocnemius 5/5 5/5   Intrinsics 5/5 5/5 EHL 5/5 5/5       DEEP TENDON REFLEXES                  Upper extremities: RUE LUE Lower extremities: RLE LLE   Biceps 2+ 2+ Patellar 2+ 2+   Triceps 2+ 2+ Ankle 2+ 2+   Brachioradialis 2+ 2+            Sensory intact throughout   Intact finger-to-nose   Romberg's and Pronator drift negative   Gait is normal     Radiographic Review:  Lumbar Spine MRI 07/15/2017  IMPRESSION:  1. Interval postoperative changes compatible with discectomy and posterior decompression at L2-L3 with marked improvement of the prior spinal canal stenosis at this level. No focal collection or specific evidence of leak is identified.  2. Interval subtle smooth linear signal  abnormality/T2 hypointensity involving the caudal aspect of the thecal sac with subtle enhancement, which may be associated with siderosis  as well subtle reactive change, given prior intervention and concurrent MRI brain findings.  3. Degenerative changes of the lumbar spine at L3-L4 and L4-L5 with up to moderate spinal canal stenosis at L3-L4 and moderate bilateral neural foraminal stenosis at L3-L4 and L4-L5.    Imaging reviewed with Dr. Sharyn Creamer    Plan:  37 year old male presents for evaluation of possible CSF leak.    Imaging reviewed in detail today with Dr. Leodis Liverpool.  At this time, we will proceed with repeat CT myelogram with LP for placement of IT contrast in CT with rapid dynamic scans.    Procedure explained in great detail today by Dr. Leodis Liverpool.  Consent obtained.  Patient will follow up with [facility] providers for medication management.  Encouraged follow up after procedure.    Reinforced signs and symptoms of neurologic/cognitive emergency and when to present to emergency department.    All questions answered during this visit.  Encouraged to call the office with worsening neurological symptoms, questions and/or concerns.     The supervising physician responsible for care of this patient is Dr. Mart Piggs Pannell    Shelbie Hutching, MSN, FNP-C  Department of Neurosurgery  [place]  Email: [email or web address]

## 2018-01-06 ENCOUNTER — Other Ambulatory Visit: Payer: Self-pay

## 2018-01-06 ENCOUNTER — Ambulatory Visit (HOSPITAL_BASED_OUTPATIENT_CLINIC_OR_DEPARTMENT_OTHER): Payer: No Typology Code available for payment source | Admitting: Physician Assistant

## 2018-01-06 ENCOUNTER — Telehealth (INDEPENDENT_AMBULATORY_CARE_PROVIDER_SITE_OTHER): Payer: Self-pay | Admitting: Neurological Surgery

## 2018-01-06 ENCOUNTER — Ambulatory Visit (INDEPENDENT_AMBULATORY_CARE_PROVIDER_SITE_OTHER): Payer: No Typology Code available for payment source | Admitting: Neurological Surgery

## 2018-01-06 VITALS — BP 138/81 | HR 92 | Temp 98.2°F | Resp 18 | Ht 68.0 in | Wt 164.0 lb

## 2018-01-06 DIAGNOSIS — G9600 Cerebrospinal fluid leak, unspecified: Secondary | ICD-10-CM | POA: Insufficient documentation

## 2018-01-06 NOTE — Telephone Encounter (Signed)
Patient called to Schedule appointment for procedure with Dr. Leodis Liverpool.   Please advise [phone]. Ok to leave voicemail if unable to reach. Thank You!

## 2018-01-06 NOTE — Telephone Encounter (Signed)
Left voicemail for patient in regards to scheduling LP with CT Myelogram with Dr. Leodis Liverpool. Must coordinate with radiology.   Will try calling again later to follow up.

## 2018-01-06 NOTE — Patient Instructions (Signed)
Please call Stacie at [phone] to schedule your procedure.    Call the office with worsening neurological symptoms, questions and/or concerns.     Phone Numbers:  Neurosurgery [phone]  Radiology Scheduling: [phone]  Medical Records: [phone]  Medical Records: Images/ Films [phone]    AFTER HOURS EMERGENCY NUMBER [phone] for neurosurgery on call

## 2018-01-07 ENCOUNTER — Encounter (INDEPENDENT_AMBULATORY_CARE_PROVIDER_SITE_OTHER): Payer: Self-pay | Admitting: Nurse Practitioner

## 2018-01-07 ENCOUNTER — Encounter (INDEPENDENT_AMBULATORY_CARE_PROVIDER_SITE_OTHER): Payer: Self-pay

## 2018-01-07 ENCOUNTER — Encounter (INDEPENDENT_AMBULATORY_CARE_PROVIDER_SITE_OTHER): Payer: Self-pay | Admitting: Internal Medicine

## 2018-01-07 DIAGNOSIS — G9600 Cerebrospinal fluid leak, unspecified: Secondary | ICD-10-CM

## 2018-01-07 NOTE — Progress Notes (Signed)
Additional outside imaging reviewed by Dr. Leodis Liverpool, in addition to provider at [facility] (Dr. Veleta Miners).  Due to concern of "fast" leak, we will proceed with CT myelogram of C,T,L spines.  Patient will not continue with prolong Diamox therapy.  Orders placed today.  Dr. Leodis Liverpool has discussed with patient.    Procedure to be completed at [facility]:  LP to be completed in CT  Will have C-arm in room

## 2018-01-08 ENCOUNTER — Telehealth (HOSPITAL_COMMUNITY): Payer: Self-pay | Admitting: Cardiovascular Disease

## 2018-01-08 ENCOUNTER — Other Ambulatory Visit (INDEPENDENT_AMBULATORY_CARE_PROVIDER_SITE_OTHER): Payer: Self-pay | Admitting: Neurological Surgery

## 2018-01-08 DIAGNOSIS — G9600 Cerebrospinal fluid leak, unspecified: Secondary | ICD-10-CM

## 2018-01-08 NOTE — Telephone Encounter (Signed)
Confirmed procedure date with pt. Provided check in details verbally.      Provider: Leodis Liverpool  Procedure: CT MYELOGRAM AND BLOOD PATCH  Date: 01/17/18  Check-in Time/Start time: 0900/1000  Location: [facility] IR  Pre Op?: NO  Labs: NO  Meds: NONE  Sedation: LOCAL- POSSIBLE MS  Post op?: PRN

## 2018-01-08 NOTE — Telephone Encounter (Signed)
Routing to Dr. Narezkina for review and recommendations.    Del Overfelt J. Hermione Havlicek, RN, BSN  [facility], [place]

## 2018-01-08 NOTE — Telephone Encounter (Signed)
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  He has a full clinic tomorrow, if he is called tomorrow he may not be able to answer his phone.  I did verify that he can also receive messages and correspond with my chart.    He reports he has been having CSF leaks, but also was told by a pharmacists that vasculitis can be a side effect of Praulent .  Please call patient when available.

## 2018-01-09 ENCOUNTER — Encounter (HOSPITAL_BASED_OUTPATIENT_CLINIC_OR_DEPARTMENT_OTHER): Payer: Self-pay

## 2018-01-09 NOTE — Progress Notes (Signed)
Attending Note:    I have personally performed a face-to-face encounter with the patient.    Subjective:  My history findings are as follows: 37 yo male with known ventral cervicothoracic CSF leak presenting with recurrent postural headaches and brain fog for 1 year.  Had prior patches at [facility] in 2010.      Objective:   My physical examination findings are as follows: Neurointact as per NP note.    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  Recommend repeat dynamic CT myelogram and possible blood/fibrin glue patch same day.    I explained the dural anatomy to the patient and typical etiologies for csf leak.  I also explained the need isolation of the specific location by performing myelograms and/or radionuclide studies.  I also explained that the leak may be due to underlying connective tissue disorder or prior trauma.  I explained when various definitive procedures are employed and when surgical intervention is needed. The patient verbalized understanding and all questions were answered.    I spent 30 minutes with the patient discussing management of the above problems. More than 50% of the time was spent on management.    [place]

## 2018-01-09 NOTE — Telephone Encounter (Addendum)
Patient called back. Concern for vasculitis was brought up by a pharmacist.  No rash, no easy bruising, no chest pain, no shortness of breath.  Was experiencing sacral pain, and pressure behind the eyes, postural headache, suspicious of recurrent CSF leak. There is a plan for repeat myelogram with potential patching.  Advised that unlikely those symptoms are related to PCSK9, and related to vasculitis. Recommend to proceed with myelogram as planned, and if symptoms do not resolve after patching, could consider a trial off PCSK9 inhibitors for several months.  Patient verbalized understanding and thanked me for the call.

## 2018-01-11 ENCOUNTER — Encounter (HOSPITAL_BASED_OUTPATIENT_CLINIC_OR_DEPARTMENT_OTHER): Payer: Self-pay | Admitting: Pain Medicine

## 2018-01-13 ENCOUNTER — Ambulatory Visit (INDEPENDENT_AMBULATORY_CARE_PROVIDER_SITE_OTHER): Payer: No Typology Code available for payment source | Admitting: Dermatology

## 2018-01-17 ENCOUNTER — Ambulatory Visit (HOSPITAL_BASED_OUTPATIENT_CLINIC_OR_DEPARTMENT_OTHER): Payer: No Typology Code available for payment source

## 2018-01-17 ENCOUNTER — Encounter (HOSPITAL_BASED_OUTPATIENT_CLINIC_OR_DEPARTMENT_OTHER): Admission: RE | Disposition: A | Discharge status: Home Health | Payer: Self-pay | Attending: Neurological Surgery

## 2018-01-17 ENCOUNTER — Encounter (HOSPITAL_BASED_OUTPATIENT_CLINIC_OR_DEPARTMENT_OTHER): Payer: Self-pay

## 2018-01-17 ENCOUNTER — Ambulatory Visit
Admission: RE | Admit: 2018-01-17 | Discharge: 2018-01-17 | Disposition: A | Discharge status: Home / Self Care | Payer: No Typology Code available for payment source | Attending: Neurological Surgery | Admitting: Neurological Surgery

## 2018-01-17 DIAGNOSIS — M50322 Other cervical disc degeneration at C5-C6 level: Secondary | ICD-10-CM | POA: Insufficient documentation

## 2018-01-17 DIAGNOSIS — Z9889 Other specified postprocedural states: Secondary | ICD-10-CM | POA: Insufficient documentation

## 2018-01-17 DIAGNOSIS — M48061 Spinal stenosis, lumbar region without neurogenic claudication: Secondary | ICD-10-CM | POA: Insufficient documentation

## 2018-01-17 DIAGNOSIS — G9600 Cerebrospinal fluid leak, unspecified: Secondary | ICD-10-CM | POA: Insufficient documentation

## 2018-01-17 DIAGNOSIS — M5021 Other cervical disc displacement,  high cervical region: Secondary | ICD-10-CM | POA: Insufficient documentation

## 2018-01-17 DIAGNOSIS — M5031 Other cervical disc degeneration,  high cervical region: Secondary | ICD-10-CM | POA: Insufficient documentation

## 2018-01-17 DIAGNOSIS — M5126 Other intervertebral disc displacement, lumbar region: Secondary | ICD-10-CM

## 2018-01-17 DIAGNOSIS — M5145 Schmorl's nodes, thoracolumbar region: Secondary | ICD-10-CM | POA: Insufficient documentation

## 2018-01-17 DIAGNOSIS — M50222 Other cervical disc displacement at C5-C6 level: Secondary | ICD-10-CM | POA: Insufficient documentation

## 2018-01-17 DIAGNOSIS — R29890 Loss of height: Secondary | ICD-10-CM | POA: Insufficient documentation

## 2018-01-17 DIAGNOSIS — M5124 Other intervertebral disc displacement, thoracic region: Secondary | ICD-10-CM | POA: Insufficient documentation

## 2018-01-17 DIAGNOSIS — M5136 Other intervertebral disc degeneration, lumbar region: Secondary | ICD-10-CM | POA: Insufficient documentation

## 2018-01-17 SURGERY — IR MYELOGRAM SPINE LUMBOSACRAL
Anesthesia: Local

## 2018-01-17 MED ORDER — ACETAZOLAMIDE 250 MG OR TABS
250.00 mg | ORAL_TABLET | Freq: Three times a day (TID) | ORAL | 1 refills | Status: DC
Start: 2018-01-17 — End: 2018-03-26

## 2018-01-17 MED ORDER — MIDAZOLAM HCL 2 MG/2ML IJ SOLN
INTRAMUSCULAR | Status: DC | PRN
Start: 2018-01-17 — End: 2018-01-17
  Administered 2018-01-17: 1 mg via INTRAVENOUS
  Administered 2018-01-17 (×2): 0.5 mg via INTRAVENOUS

## 2018-01-17 MED ORDER — LIDOCAINE HCL 1 % IJ SOLN
INTRAMUSCULAR | Status: DC | PRN
Start: 2018-01-17 — End: 2018-01-17
  Administered 2018-01-17: 5 mL via INTRADERMAL

## 2018-01-17 MED ORDER — IOHEXOL 240 MG/ML IJ SOLN
INTRAMUSCULAR | Status: DC | PRN
Start: 2018-01-17 — End: 2018-01-17
  Administered 2018-01-17: 12 mL via INTRAVENOUS

## 2018-01-17 MED ORDER — METOCLOPRAMIDE HCL 5 MG/ML IJ SOLN
10.0000 mg | INTRAMUSCULAR | Status: AC | PRN
Start: 2018-01-17 — End: 2018-01-17

## 2018-01-17 MED ORDER — ACETAZOLAMIDE 250 MG OR TABS
250.00 mg | ORAL_TABLET | Freq: Three times a day (TID) | ORAL | Status: DC
Start: 2018-01-17 — End: 2018-01-17

## 2018-01-17 MED ORDER — HEPARIN SODIUM LOCK FLUSH 100 UNIT/ML IJ SOLN CUSTOM
500.0000 [IU] | Freq: Once | INTRAVENOUS | Status: DC | PRN
Start: 2018-01-17 — End: 2018-01-18

## 2018-01-17 MED ORDER — FENTANYL CITRATE (PF) 100 MCG/2ML IJ SOLN
INTRAMUSCULAR | Status: DC | PRN
Start: 2018-01-17 — End: 2018-01-17
  Administered 2018-01-17: 25 ug via INTRAVENOUS
  Administered 2018-01-17: 50 ug via INTRAVENOUS
  Administered 2018-01-17: 25 ug via INTRAVENOUS

## 2018-01-17 MED ORDER — ONDANSETRON HCL 4 MG/2ML IV SOLN
4.0000 mg | INTRAMUSCULAR | Status: AC | PRN
Start: 2018-01-17 — End: 2018-01-17

## 2018-01-17 SURGICAL SUPPLY — 4 items
LINE MONITORING 6" RED STRIPE (Tubing/Suction) ×2 IMPLANT
NEEDLE SPINAL PENCAN 25G X 5" (Needles/punch/cannula/biopsy) ×2 IMPLANT
PROCEDURE PACK - IR NON-VASCULAR (Procedure Packs/kits) ×2 IMPLANT
TRAY LUMBAR PUNCTURE ADLT 20G (Kits/Sets/Trays) ×2

## 2018-01-17 NOTE — H&P (Signed)
HISTORY & PHYSICAL - INTERVAL ASSESSMENT    **ONLY TO BE USED IN ADDITION TO A HISTORY & PHYSICAL**    [name], PhD  [id number]      This interval assessment is required for History & Physical completed less than 30 days but more than 24 hours prior to the admission or surgery. A History & Physical completed more than 30 days prior to the admission or surgery must be repeated.    Current Medical Status:  Unchanged    Medications / Allergies:  Unchanged    Review of Systems:  Unchanged    Physical Examination:  I have examined the patient today. Pertinent portions of the exam are unchanged.    Laboratory or Clinical Data:  Unchanged    Modifications of Initial Care Plan:  Unchanged    PRE-PROCEDURE RECORD FOR SEDATION    Planned procedure:  Myelogram and Blood patch    Planned type of sedation:   Moderate sedation.  I have discussed this procedure, its potential risks and complications, the risks associated with refusal of the procedure, and alternatives to the procedure with this patient.  Planned sedation route:  IV.    The patient has consented to the procedure.    Sedation/Anesthesia History:    The patient has no history of an adverse reaction to sedation or anesthesia.    I have assessed the patient's status immediately prior to this procedure:  Yes  I have discussed pain management needs and options for the patient with the patient or caregiver:  Yes    Sedation options, risks, and plans have been discussed with the patient or caregiver.  Questions were answered.  The patient or caregiver agrees to proceed as planned.    Airway History and Assessment:      Airway Class:  Class II.  Neck range of motion  normal.    Potential airway issues?  None    ASA classification of physical status:  2.  A patient with mild systemic disease.    --    Willodean Rosenthal Haven Foss     01/17/18     11:05 AM

## 2018-01-17 NOTE — Discharge Instructions (Signed)
Interventional Radiology   DISCHARGE INSTRUCTIONS FOR PROCEDURE:  LUMBAR PUNCTURE/MYELOGRAM    ACTIVITY         ? You may return to normal activity the day after the procedure.  ? No exercising of lifting heavy  objects e.g. anything  over 10 pounds, for the next 24 hours  ? You may shower the day after your procedure.  ? DRINK PLENTY OF FLUID FOR 24 HOURS AFTER THE PROCEDURE.    PAIN MANAGEMENT  ? You may use  over-the-counter acetaminophen (TYLENOL®) for minor discomfort,  if you are not otherwise restricted from using this medication.    CARE OF YOUR PUNCTURE SITE  ? You will have  a small bandage over the site.  ? You may remove the bandage the following day.  Keep site dry and clean.  No new bandage is needed.    WHEN TO CALL YOUR PHYSICIAN  ? Fever  (temp > 100o F) and / or chills  ? Severe pain  ? Bleeding,  redness or swelling at the procedure site  ? Shortness of breath  ? Severe headache uncontrolled with acetaminophen (Tylenol®) or other pain meds you have been prescribed.    MEDICATIONS  ? Please resume taking your regular  medications, unless otherwise told by your doctor.      PROCEDURE RESULTS  ? Contact your primary care  doctor in 72 hours  for your lumbar puncture.      THINGS TO EXPECT:  ?     PHYSICIAN PERFORMING YOUR PROCEDURE:  Dr.  Pannell     ADDITIONAL INSTRUCTIONS:                   CONTACT US:  ? During regular  business hours,  M-F 8:00 to 4:00, please call  [phone] ([place]) or [phone] ([place])  ? After-hours,  please call the [facility] at [phone] and ask for the Neuro Interventional Radiologist On-Call.

## 2018-01-17 NOTE — Brief Op Note (Signed)
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     PROCEDURE INFORMATION:  Procedure(s):  LP for placement of IT contrast, followed by CT Myelogram and blood patch    ATTENDING SURGEON:   Surgeon(s) and Role:     * Darsha Zumstein, Willodean Rosenthal, MD - Primary    ANESTHESIA: local and moderated sedation    FINDINGS: See above    SPECIMENS: none    Fluids/Blood Products:      IV Fluids: none    Blood Products: none    EBL: less than 1 cc    Urine Output: none    COMPLICATIONS: none immediate    DISPOSITION: DC home.

## 2018-01-20 ENCOUNTER — Ambulatory Visit (INDEPENDENT_AMBULATORY_CARE_PROVIDER_SITE_OTHER): Payer: No Typology Code available for payment source | Admitting: Ophthalmology

## 2018-01-21 NOTE — Op Note (Signed)
DATE OF SERVICE:  01/21/2018    PREOPERATIVE DIAGNOSIS:  Ventral cervical thoracic cerebrospinal  fluid leak status post prior patch.     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  No active extravasation from the pseudomeningocele was identified.     PROCEDURE PERFORMED:    1.  CT-guided lumbar puncture.  2.  CT myelogram with rapid initial and delayed scanning.    SURGEON/STAFF:  Toney Rakes, MD    ASSISTANT:  None.    CT DOSE:  3361.64 mGy-cm DLP.    CLINICAL INFORMATION:  Erik Burke is a 37 year old male with  prior CSF leak now with recurrent headache status post blood patch  with multiple years symptom-free.  The patient's headaches, however,  are different from prior headaches in that the headaches do not  worsen upon standing.  Headaches worsen upon lying down or sitting  down.  The patient is here for repeat myelogram to evaluate for  ongoing active leak.     OPERATIVE COURSE:  Detailed written informed consent was obtained.  The patient was brought into the CT suite and placed on the CT table  in the prone position on a bolster elevating the chest.  A scout CT  was performed.  Skin was prepped and draped in usual sterile manner.  Skin overlying the puncture site was anesthetized with 1% lidocaine.     We advanced a 20-gauge spinal needle up to the level of the  ligamentum flavum under CT guidance.  Next, we performed the lumbar  puncture with a 25-gauge pin can needle placed through the 20-gauge  spinal needle.  Once we were satisfied with position of our needle on  the CT, we removed the stylus from the needle and clear CSF flowed  from the hub of the needle spontaneously indicating a pressure of at  least 12.5 cm of water on the bolster.  However, pressures would have  been inaccurate due to the bolster.  Next, we injected 12.5 cc  of  Omnipaque 240 and removed the needles after confirming correct  location of the contrast with CT.     We lowered the patient's bolster and performed single shot CT scans  of the cervical thoracic junction until the contrast reached the  cervicothoracic junction.  Next, we performed a rapid scan from the  skull base to the mid thoracic spine, which demonstrated a dural tear  at the C7-T1 level with contrast gradient beginning at this level.     We rolled the patient and allowed 30 minutes of additional time to  pass for leaking.  We performed a final diagnostic scan after  allowing additional time for leak.  The additional CT scan failed to  show any active extravasation of contrast from the pseudomeningocele.   In comparison, the prior myelogram performed at [facility]  demonstrated clear active extravasation into the lateral epidural  space extending through the foramina in the cervical spine.  At this  point, given the lack of active extravasation and unusual character  of the patient's headaches, we elected to halt the procedure in favor  of trial of Diamox.  The patient was released with no complications.     DIAGNOSTIC INTERPRETATION OF FILMS:  Rapid and delayed scans reveal  pseudomeningocele extending from the C2 level to the T7 level with  initial dural tear at the C7-T1 level.  No active extravasation  identified.     Please see separately dictated CT report for further details.    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  Given the lack of active  extravasation of contrast from the pseudomeningocele on delayed  imaging, we elected to halt the procedure in favor of a Diamox trial  based upon presumptive diagnosis of communicating hydrocephalus  related to prior long-standing cerebrospinal fluid leak and adaptive  production/reabsorption of cerebrospinal fluid.      Dr. Willodean Rosenthal Akash Winski was present and scrubbed for the entirety  of the case.       Job #:  [id number]

## 2018-01-24 ENCOUNTER — Telehealth (HOSPITAL_COMMUNITY): Payer: Self-pay | Admitting: Cardiovascular Disease

## 2018-01-24 NOTE — Telephone Encounter (Signed)
Routing to MD, please see pt's request below.

## 2018-01-24 NOTE — Telephone Encounter (Signed)
Patient calling to speak to MD directly.  Please ask Dr. Cherylann Ratel to call patient she was aware he was going to call to speak with her after myelogram.

## 2018-01-25 ENCOUNTER — Encounter (INDEPENDENT_AMBULATORY_CARE_PROVIDER_SITE_OTHER): Payer: Self-pay | Admitting: Cardiovascular Disease

## 2018-01-25 ENCOUNTER — Encounter (INDEPENDENT_AMBULATORY_CARE_PROVIDER_SITE_OTHER): Payer: Self-pay | Admitting: Neurological Surgery

## 2018-01-25 DIAGNOSIS — G9611 Dural tear: Secondary | ICD-10-CM

## 2018-01-27 ENCOUNTER — Ambulatory Visit (INDEPENDENT_AMBULATORY_CARE_PROVIDER_SITE_OTHER): Payer: No Typology Code available for payment source

## 2018-01-27 DIAGNOSIS — Z23 Encounter for immunization: Secondary | ICD-10-CM

## 2018-01-27 NOTE — Interdisciplinary (Signed)
Patient received flu injection, patient tolerated well.    Verified Immunizations/medications prior to administration for ddivino, MA.

## 2018-01-27 NOTE — Telephone Encounter (Signed)
Routing to Dr. Narezkina for review and recommendations.    Syniah Berne J. Zaida Reiland, RN, BSN  [facility], [place]

## 2018-01-27 NOTE — Telephone Encounter (Signed)
Still experiences pain in the area of sacrum and coccyx, extending down the hips. Sitting for extended period of time exacerbates it. Also associated with pressure behind the eyes.   His last injection of Praluent was 2 weeks.     Although not a typical reaction to Praluent, OK to hold it for several months, and see if there is an improvement (half life ~20 days). the patient will update me on his symptoms.  Patient verbalized understanding and thanked me for the call.

## 2018-01-27 NOTE — Telephone Encounter (Signed)
From: [name], PhD  To: Sherron Monday, MD  Sent: 01/25/2018 8:23 AM PDT  Subject: 1-Non Urgent Medical Advice    Hi Dr. Cherylann Ratel,     Myelogram couple weeks ago generally indicated no current CSF leaks. I'm wondering if some of the sxs may be related to Praluent side effects. I skimmed a couple studies that indicated myalgia and musculoskeletal side effects were present at a statistically significantly greater rate in persons taking Praluent compared to placebo group, albeit at a relatively low rate compared to whole Praluent group sample (3-8%, I believe). Given my body's strange reaction to both statins and even non-statins (e.g. Zetia), I'm wondering if it would be worth going off Praluent for some time to observe whether sxs diminish. I could always start again and see if sxs return. What do you think?    Before doing this, would you mind if I get a lipid panel to determine the effectiveness of Praluent to date?    Have a great weekend,  Erik Burke

## 2018-01-28 ENCOUNTER — Encounter (HOSPITAL_BASED_OUTPATIENT_CLINIC_OR_DEPARTMENT_OTHER): Payer: Self-pay | Admitting: Hospital

## 2018-02-04 ENCOUNTER — Other Ambulatory Visit: Payer: Self-pay

## 2018-02-10 ENCOUNTER — Encounter (INDEPENDENT_AMBULATORY_CARE_PROVIDER_SITE_OTHER): Payer: Self-pay | Admitting: Neurological Surgery

## 2018-02-11 NOTE — Telephone Encounter (Signed)
Routed to Keri O'Connor, NP and Dr. Pannell  to review and advise.

## 2018-02-14 ENCOUNTER — Other Ambulatory Visit
Admission: RE | Admit: 2018-02-14 | Discharge: 2018-02-14 | Disposition: A | Discharge status: Home / Self Care | Payer: No Typology Code available for payment source | Attending: Nurse Practitioner | Admitting: Nurse Practitioner

## 2018-02-14 DIAGNOSIS — G9611 Dural tear: Principal | ICD-10-CM | POA: Insufficient documentation

## 2018-02-14 LAB — ELECTROLYTES, BLOOD
Anion Gap: 12 mmol/L (ref 7–15)
Bicarbonate: 19 mmol/L — ABNORMAL LOW (ref 22–29)
Chloride: 108 mmol/L — ABNORMAL HIGH (ref 98–107)
Potassium: 3.6 mmol/L (ref 3.5–5.1)
Sodium: 139 mmol/L (ref 136–145)

## 2018-02-14 NOTE — Telephone Encounter (Signed)
Pt called to follow up on MyChart message from 02/10/18 and would also like to schedule follow up with MD.    Please advise if pt is to schedule follow up with Dr.Pannell or Dr.Khalessi.    Pt kindly requests call back:  P)[phone]

## 2018-02-17 ENCOUNTER — Telehealth (INDEPENDENT_AMBULATORY_CARE_PROVIDER_SITE_OTHER): Payer: Self-pay | Admitting: Dermatology

## 2018-02-17 ENCOUNTER — Ambulatory Visit (INDEPENDENT_AMBULATORY_CARE_PROVIDER_SITE_OTHER): Payer: No Typology Code available for payment source | Admitting: Ophthalmology

## 2018-02-17 DIAGNOSIS — L700 Acne vulgaris: Principal | ICD-10-CM

## 2018-02-17 DIAGNOSIS — G43809 Other migraine, not intractable, without status migrainosus: Secondary | ICD-10-CM

## 2018-02-17 NOTE — Telephone Encounter (Signed)
Routed to care team to review and advise.

## 2018-02-18 MED ORDER — TRETINOIN 0.025 % EX CREA
TOPICAL_CREAM | CUTANEOUS | 3 refills | Status: DC
Start: 2018-02-18 — End: 2018-02-26

## 2018-02-18 NOTE — Telephone Encounter (Signed)
Last OV: 11/11/17  Next OV: None

## 2018-02-19 ENCOUNTER — Telehealth (INDEPENDENT_AMBULATORY_CARE_PROVIDER_SITE_OTHER): Payer: Self-pay | Admitting: Neurological Surgery

## 2018-02-19 NOTE — Telephone Encounter (Signed)
Received via fax retrospective drug utilization review from [facility] stating potential clinical concerns.    Document has been faxed to [phone]

## 2018-02-20 NOTE — Telephone Encounter (Signed)
Received and sent to Shelbie Hutching, NP for review.

## 2018-02-24 ENCOUNTER — Encounter (INDEPENDENT_AMBULATORY_CARE_PROVIDER_SITE_OTHER): Payer: Self-pay | Admitting: Nurse Practitioner

## 2018-02-24 NOTE — Telephone Encounter (Signed)
From: [name], PhD  To: Eduard Roux, NP  Sent: 02/24/2018 6:33 AM PST  Subject: 1-Non Urgent Medical Advice    Hi Jordyne Poehlman,    Based on my recent lab results, should I be taking potassium simultaneous to acetazolamide?    Thanks,  [name]

## 2018-02-25 NOTE — Telephone Encounter (Signed)
Re-send refill for meds   tretinoin (RETIN-A) 0.025 % cream     [id number] - [place], [place] - [address] AT [place]

## 2018-02-26 ENCOUNTER — Encounter (INDEPENDENT_AMBULATORY_CARE_PROVIDER_SITE_OTHER): Payer: Self-pay | Admitting: Hospital

## 2018-02-26 MED ORDER — TRETINOIN 0.025 % EX CREA
TOPICAL_CREAM | CUTANEOUS | 3 refills | Status: AC
Start: 2018-02-26 — End: ?

## 2018-02-26 NOTE — Addendum Note (Signed)
Addended by: Cher Nakai on: 02/26/2018 01:07 PM     Modules accepted: Orders

## 2018-02-26 NOTE — Telephone Encounter (Signed)
sent 

## 2018-03-03 ENCOUNTER — Encounter (INDEPENDENT_AMBULATORY_CARE_PROVIDER_SITE_OTHER): Payer: Self-pay | Admitting: Nurse Practitioner

## 2018-03-03 DIAGNOSIS — G96198 Other disorders of meninges, not elsewhere classified: Secondary | ICD-10-CM

## 2018-03-03 NOTE — Telephone Encounter (Signed)
From: [name], PhD  To: Eduard Roux, NP  Sent: 03/03/2018 2:50 PM PST  Subject: 1-Non Urgent Medical Advice    Hi Erik Burke, would love the referral to Dr. Devin Going and/or Trinna Balloon at [facility]. (Would it be possible to at least get consultation referral to [facility]?) Thanks!    Erik Burke      ----- Message -----  From: Eduard Roux, NP  Sent: 02/24/18, 09:45  To: [name], PhD  Subject: RE: 1-Non Urgent Medical Advice    HI Erik Burke,    In regards to your labs, there is no need to supplement at this time. Regularly checking your electrolytes with you PCP is a good idea.  In regards to your continued follow up, Dr. Leodis Liverpool is happy to refer you to Dr. Devin Going, one of our complex spine surgeons, if you're interested in repair of the noted pseudomeningocele.  Please let me know if you have further questions.    Best,  [place] MSN, FNP-C        ----- Message -----   From: [name], PhD   Sent: 02/24/2018 6:33 AM PST   To: Eduard Roux, NP  Subject: 1-Non Urgent Medical Advice    Hi Erik Burke,    Based on my recent lab results, should I be taking potassium simultaneous to acetazolamide?    Thanks,  Erik Burke

## 2018-03-05 ENCOUNTER — Encounter (HOSPITAL_BASED_OUTPATIENT_CLINIC_OR_DEPARTMENT_OTHER): Payer: Self-pay | Admitting: Pain Medicine

## 2018-03-05 ENCOUNTER — Encounter (INDEPENDENT_AMBULATORY_CARE_PROVIDER_SITE_OTHER): Payer: Self-pay

## 2018-03-05 ENCOUNTER — Other Ambulatory Visit: Payer: Self-pay

## 2018-03-05 ENCOUNTER — Ambulatory Visit: Payer: No Typology Code available for payment source | Admitting: Pain Medicine

## 2018-03-05 VITALS — BP 135/78 | HR 89

## 2018-03-05 DIAGNOSIS — M533 Sacrococcygeal disorders, not elsewhere classified: Secondary | ICD-10-CM

## 2018-03-05 DIAGNOSIS — M5417 Radiculopathy, lumbosacral region: Principal | ICD-10-CM

## 2018-03-05 NOTE — Patient Instructions (Addendum)
Referral to PT placed    Previous order for caudal ESI changed to lumbar ESI  Order placed for sacrococcygeal ligament injection plus ganglion impar block

## 2018-03-05 NOTE — Progress Notes (Signed)
PAIN CLINIC FOLLOW UP NOTE    Primary Care Physician [name]    Chief Complaint: Low Back Pain and Leg Pain (bilateral)      SUBJECTIVE:  This is a 37 year old male with PMH significant for prior laminectomy and discectomy, intracranial hypotension, dural tears with CSF leak s/p multiple blood patch attempts, chronic sacrum/coccyx pain and headaches s/p caudal steroid injection 12/02/17 now presenting for continued tail bone pain now with possible radicular component.    After the caudal ESI, 2-days of possible relief then return to baseline symptoms.  Most effect ESI were facet joint, which lasted 8 months.      Approximately 2 months ago, the patient reports new onset shooting pain sensation involving the bilateral lower extremities.  Left leg shooting pain travels to the foot.  Right leg shooting pain travels to just below the knee.  This pain is reminiscent of past radicular pain prior to surgical repair of his herniated disc.  Changing positions from standing to sitting or visa versa exacerbates shooting pain down bilateral legs.  Exacerbated by valsalva.    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 He is hoping to narrow down the diagnosis to focus his PT and continue to improve his symptoms.    No one investigated connective tissue disease past family history    With regard to headaches, the patient notes some improvement after starting acetazolamide.      Current Description of Symptoms:   On the pain diagram today the patient shades in the areas of their coccygeal region.  Since the last visit in Pain Clinic patient reports N/A improvement in their pain from pain procedures.  Patient reports N/A improvement in their pain with use of their current pain medications.  The patient has not seen a physical therapist to treat the current problem.   Over the last 12 months, they have done 0 sessions and over the last 6 months they have done 0 sessions.      The patient is doing a home exercise program.  They describe their pain as aching, radiating, cutting, sharp, shooting and stabbing. Patient states their pain is associated with none. This pain has made it hard for the patient to sleep, sit and be with family.    The patient denies saddle anesthesia or bowel or bladder incontinence.    The patient stated their pain today is Pain Score: 4/10.   Over the past week the patient's pain has been at its worst 8/10, at best 3/10 and averages 6/10.   During the past week, it has interfered with enjoyment of life 5/10 and general activity 4/10.    Pain Treatments  Previous pain procedures, dates, and response include:   - Caudal ESI 12/02/17: relief for ~2 days  - Previous caudal ESI's with relief   - Previous facet injections with most significant long-term relief    Patient is currently using the following pain medications:  - None     Past Medical History:   Diagnosis Date   . Acne    . Back pain     secondary to herniated disc, from back injury in 2000   . Hyperhidrosis     propanolol tx   . Hyperlipidemia     LDL > 200, TG ~400 at Dx, statin intolerant.  Per pt, r/o for familial HDL   . Parasomnia associated with sexual behavior     Tx with Caprice Renshaw, has been being  downtitrated     Past Surgical History:   Procedure Laterality Date   . l3/4 laminectomy  2014    @ [facility], 2014   . epidural steroid injection      x5 ESIs, most ecent 2017     Current Outpatient Medications   Medication Sig Dispense Refill   . acetaZOLAMIDE (DIAMOX) 250 MG tablet Take 1 tablet (250 mg) by mouth 3 times daily. 90 tablet 1   . alirocumab (PRALUENT) 75 MG/ML SOPN injection pen INJECT 1 ML (75 MG) UNDER THE SKIN EVERY 14 DAYS. 2 mL 3   . clonazePAM (KLONOPIN) 0.5 MG tablet Take 0.5 tablets (0.25 mg) by mouth at bedtime. 15 tablet 0   . desonide (DESOWEN) 0.05 % cream Apply 1 Application topically 2 times daily. apply to affected areas 15 g 1   . oxymetazoline (AFRIN) 0.05 % nasal spray Spray 2  sprays into each nostril 2 times daily. Recommend limiting use to three days only 1 bottle 0   . propranolol (INDERAL LA) 60 MG CP24 Take 1 capsule (60 mg) by mouth daily. 90 each 3   . pseudoePHEDrine (SUDAFED 12 HOUR) 120 MG Controlled-Release tablet Take 1 tablet (120 mg) by mouth 2 times daily as needed for Congestion. 20 tablet 0   . tretinoin (RETIN-A) 0.025 % cream Use a pea sized amount on the entire face nightly.  At first, you may use every other night to avoid dryness and irritation, and gradually increase use to nightly as tolerated 45 g 3     No current facility-administered medications for this visit.      Allergies   Allergen Reactions   . Atorvastatin Other     Debilitating myalgias   . Pravastatin Other     Debilitating myalgias   . Rosuvastatin Other     Debilitating myalgias   . Zetia [Ezetimibe] Other     Debilitating myalgias     Social History     Socioeconomic History   . Marital status: Married     Spouse name: Not on file   . Number of children: Not on file   . Years of education: Not on file   . Highest education level: Not on file   Occupational History   . Not on file   Social Needs   . Financial resource strain: Not on file   . Food insecurity:     Worry: Not on file     Inability: Not on file   . Transportation needs:     Medical: Not on file     Non-medical: Not on file   Tobacco Use   . Smoking status: Never Smoker   . Smokeless tobacco: Never Used   Substance and Sexual Activity   . Alcohol use: Yes     Comment: 1-3 drinks infrequently   . Drug use: Not on file   . Sexual activity: Not on file   Lifestyle   . Physical activity:     Days per week: Not on file     Minutes per session: Not on file   . Stress: Not on file   Relationships   . Social connections:     Talks on phone: Not on file     Gets together: Not on file     Attends religious service: Not on file     Active member of club or organization: Not on file     Attends meetings of clubs or organizations: Not on file  Relationship status: Not on file   . Intimate partner violence:     Fear of current or ex partner: Not on file     Emotionally abused: Not on file     Physically abused: Not on file     Forced sexual activity: Not on file   Other Topics Concern   . Not on file   Social History Narrative    PhD in Psychology, works in [employer], [employer]     Family History   Problem Relation Name Age of Onset   . Cancer Mother          cervical   . No Known Problems Father     . Cancer Maternal Grandmother          ovarian   . No Known Cancer Maternal Grandfather     . Heart Disease Maternal Grandfather     . Cancer Paternal Grandmother          pancreatic   . Cancer Paternal Grandfather          mesothelioma   . Cancer Brother  17        osteosarcoma       Review of Systems  Constitutional: Negative  Eyes: Negative  ENT: Negative  Cardiac: Negative  Pulmonary: Negative  Gastrointestional: Negative  Musculoskeletal: Negative  Skin: Negative  Neurologic: Negative  Psychiatric: Negative  Remainder of complete ROS is negative except as above and scanned under Media.    Physical Exam:   Vitals: BP 135/78 (BP Location: Left arm, BP Patient Position: Sitting, BP cuff size: Regular)   Pulse 89   Constitutional: Vital signs listed above. Well-developed, well-nourished, and no distress  Psych: alert, awake and oriented, oriented. Speech is clear/ normal. Affect is euthymic.  Eyes: Sclera white, conjunctiva clear, lids are without lag. Pupils equal, not pinpoint.  ENT: Oropharynx clear and moist without erythema. Gums pink, good dentition.  CV:  Skin warm and dry. No lower extremity edema.  Respiratory:  Breathing easily without tachypnea or bradypnea. Not using accessory muscles.  GI/Abdomen: Soft, non-tender, non-distended.  Skin: Skin color, texture, turgor normal. No rashes or lesions.  Musculoskeletal: No occipital, paracervical, or shoulder muscle tenderness . Tenderness to palpation in coccygeal region.   Neurological:  Mental  Status; Awake, alert, oriented  Cranial Nerves: II-XII grossly intact  Motor: Normal bulk and tone.   Coordination: No gross axial or appendicular ataxia.  Sensory: Intact to light touch  Gait: Pt is able to raise from a seated position without difficulty. Gait  is not antalgic and the patient ambulates without assistance.     Labs and Imaging:  MRI Lumbar Spine, 07/15/17  IMPRESSION:  1. Interval postoperative changes compatible with discectomy and posterior decompression at L2-L3 with marked improvement of the prior spinal canal stenosis at this level. No focal collection or specific evidence of leak is identified.  2. Interval subtle smooth linear signal abnormality/T2 hypointensity involving the caudal aspect of the thecal sac with subtle enhancement, which may be associated with siderosis as well subtle reactive change, given prior intervention and concurrent MRI brain findings.  3. Degenerative changes of the lumbar spine at L3-L4 and L4-L5 with up to moderate spinal canal stenosis at L3-L4 and moderate bilateral neural foraminal stenosis at L3-L4 and L4-L5.    Assessment:  Encounter Diagnoses   Name Primary?   . Lumbosacral radiculopathy Yes   . Coccygeal pain        This is a  37 year old male with PMH significant for prior laminectomy and discectomy, intracranial hypotension, dural tears with CSF leak s/p multiple blood patch attempts, chronic sacrum/coccyx pain and headaches s/p caudal steroid injection 12/02/17 now presenting for continued tail bone pain now with suspected bilateral radicular leg component, most likely L5 distribution. MRI significant for moderate bilateral neural foraminal stenosis at L4-L5. His coccygeal/perineal pain may be due to interval subtle smooth linear signal abnormality/T2 hypointensity involving the caudal aspect of the thecal sac with subtle enhancement, which may be associated with siderosis as well subtle reactive change, given prior intervention and concurrent MRI brain  findings. This may benefit from SCS, with caveat that infection risk may be increased if continues to have blood patches.    PLAN:   - Referral to PT for radicular leg pain  - If PT insufficient, plan for L4-5 ILESI  - Sacrococcygeal ligament injection plus ganglion impar block for chronic tailbone and perineal pain    Follow-up: after above

## 2018-03-05 NOTE — Progress Notes (Signed)
Attending Note:     Subjective:   I reviewed the history.   Patient interviewed and examined.   This is a 37 year old male who has a past medical history of Acne, Back pain, Hyperhidrosis, Hyperlipidemia, and Parasomnia associated with sexual behavior. he is here today with chronic tailbone and perineal pain and now also bilateral leg radicular pain here for follow-up.  Review of Systems (ROS):  Review of Systems  Constitutional: Negative  Eyes: Negative  ENT: Negative  Cardiac: Negative  Pulmonary: Negative  Gastrointestional: Negative  Musculoskeletal: Negative  Skin: Negative  Neurologic: Negative  Psychiatric: Negative  Past Medical, Family, Social History: As per the resident physician note.   Objective:   I have examined the patient and I concur with the resident physician exam as documented.   Assessment and plan reviewed with the resident physician.   I agree with the resident physician as documented.     Encounter Diagnoses   Name Primary?   . Lumbosacral radiculopathy Yes   . Coccygeal pain        Urban was seen today for low back pain and leg pain.    Diagnoses and all orders for this visit:    Lumbosacral radiculopathy  -     Consult to [facility] - Internal; Future    Coccygeal pain  -     Case Request: PAIN TPI (1 OR 2 MUSCLES)- sacrococcygeal ligament injection and ganglion impar block with X-ray; Standing      Patient Instructions  Referral to PT placed    Previous order for caudal ESI changed to lumbar ESI  Order placed for sacrococcygeal ligament injection plus ganglion impar block        I have examined the patient, discussed the findings, reviewed the plan, and answered all questions with the patient.  See the resident physician note for further details.    Peter Minium, MD   Pain Neurologist  Assistant [facility]  Department of Anesthesiology  [place]    More than 25 min was spent with the patient today, of which more than 50% was  spent in counseling regarding the diagnosis, prognosis, further diagnostic and therapeutic options and coordinating this care.  There were no barriers to learning.

## 2018-03-06 ENCOUNTER — Encounter (HOSPITAL_BASED_OUTPATIENT_CLINIC_OR_DEPARTMENT_OTHER): Payer: Self-pay

## 2018-03-07 ENCOUNTER — Encounter (INDEPENDENT_AMBULATORY_CARE_PROVIDER_SITE_OTHER): Payer: Self-pay | Admitting: Cardiovascular Disease

## 2018-03-10 ENCOUNTER — Telehealth (INDEPENDENT_AMBULATORY_CARE_PROVIDER_SITE_OTHER): Payer: Self-pay | Admitting: Dermatology

## 2018-03-10 NOTE — Telephone Encounter (Signed)
From: [name], PhD  To: Sherron Monday, MD  Sent: 03/07/2018 1:58 PM PST  Subject: 1-Non Urgent Medical Advice    Hi Dr. Cherylann Ratel,    Given we chatted recently about temporarily going off Praluent, do you think we need to keep the appointment on December 11th?    Thank you,  [name]

## 2018-03-10 NOTE — Telephone Encounter (Signed)
Routing mychart message to Dr. Cherylann Ratel and Michaelene Song, NP, Dr. Crisoforo Oxford for review and recommendations.    Alita Chyle, RN, BSN  [place]

## 2018-03-10 NOTE — Telephone Encounter (Addendum)
Received prior authorization request for tretinoin (RETIN-A) 0.025 % cream .   Completed on [email or web address]. (KEY: [id number])  Insurance company: [employer]  Awaiting approval/denial of request.       Insurance response:    Medication has been approved/pharmacy has been notified.   Matter solved

## 2018-03-11 ENCOUNTER — Telehealth (INDEPENDENT_AMBULATORY_CARE_PROVIDER_SITE_OTHER): Payer: Self-pay | Admitting: Neurological Surgery

## 2018-03-11 ENCOUNTER — Encounter (INDEPENDENT_AMBULATORY_CARE_PROVIDER_SITE_OTHER): Payer: Self-pay | Admitting: Cardiovascular Disease

## 2018-03-11 NOTE — Telephone Encounter (Signed)
Patient is scheduled for consult with:  Dr. 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  [facility]   *Remind Patient to hand carry imaging CD to appointment.

## 2018-03-12 ENCOUNTER — Encounter (INDEPENDENT_AMBULATORY_CARE_PROVIDER_SITE_OTHER): Payer: Self-pay | Admitting: Neurological Surgery

## 2018-03-17 NOTE — Telephone Encounter (Signed)
Thank you. Pt has been rescheduled for 05/12/18 at [facility].

## 2018-03-18 ENCOUNTER — Encounter (HOSPITAL_BASED_OUTPATIENT_CLINIC_OR_DEPARTMENT_OTHER): Payer: Self-pay

## 2018-03-21 ENCOUNTER — Other Ambulatory Visit (INDEPENDENT_AMBULATORY_CARE_PROVIDER_SITE_OTHER): Payer: Self-pay | Admitting: Internal Medicine

## 2018-03-21 DIAGNOSIS — G4750 Parasomnia, unspecified: Secondary | ICD-10-CM

## 2018-03-21 NOTE — Telephone Encounter (Signed)
Controlled substances are not currently included in Pharmacy Refill Clinic protocol. Re-routing to appropriate staff for processing. Thank you.

## 2018-03-21 NOTE — Telephone Encounter (Signed)
I have not seen him since Jan... I can't fill this... does he had an apt booked? If so I can give temp dose

## 2018-03-21 NOTE — Telephone Encounter (Signed)
Medication requested:    Requested Prescriptions     Pending Prescriptions Disp Refills   . clonazePAM (KLONOPIN) 0.5 MG tablet 15 tablet 0     Sig: Take 0.5 tablets (0.25 mg) by mouth at bedtime.       No results found for: AMPCLASS, BARBCLASS, BENZYLCGNN, BENZDIAZCL, METHADONE, OPIATESCL, OXY, PHENCYCLDN, TETCANNABIN, UDI    Date of last refill: 07.09.18    Last Office Visit w/ PCP: 01.11.19  Last Office Visit w/ Dept: n/a    Next Office Visit w/ PCP: n/a- needs appt   Next Office Visit w/ Dept: n/a      For opioids,date of last urine toxicology screen within one year? n/a  For opioids, narcotic contract in letter section or media section signed: n/a  CURES report within 4 months in EPIC?  ran today on 12.06.19

## 2018-03-24 NOTE — Telephone Encounter (Signed)
Pt returning call, offered several appts next week but doesn't work with pt's schedule. Pt will call back.

## 2018-03-24 NOTE — Telephone Encounter (Signed)
Called pt and left a VM advising him that he will need to be seen by Dr Selinda Eon prior to any fill on his clonazePAM Erik Burke).     Awaiting a call back.

## 2018-03-26 ENCOUNTER — Telehealth (INDEPENDENT_AMBULATORY_CARE_PROVIDER_SITE_OTHER): Payer: Self-pay | Admitting: Neurological Surgery

## 2018-03-26 ENCOUNTER — Encounter (INDEPENDENT_AMBULATORY_CARE_PROVIDER_SITE_OTHER): Payer: No Typology Code available for payment source | Admitting: Neurological Surgery

## 2018-03-26 ENCOUNTER — Encounter (INDEPENDENT_AMBULATORY_CARE_PROVIDER_SITE_OTHER): Payer: Self-pay | Admitting: Cardiovascular Disease

## 2018-03-26 ENCOUNTER — Ambulatory Visit (INDEPENDENT_AMBULATORY_CARE_PROVIDER_SITE_OTHER): Payer: No Typology Code available for payment source | Admitting: Cardiovascular Disease

## 2018-03-26 VITALS — BP 113/65 | HR 89 | Temp 98.0°F | Resp 16 | Ht 67.99 in | Wt 165.0 lb

## 2018-03-26 DIAGNOSIS — I2584 Coronary atherosclerosis due to calcified coronary lesion: Secondary | ICD-10-CM

## 2018-03-26 DIAGNOSIS — Z789 Other specified health status: Secondary | ICD-10-CM

## 2018-03-26 DIAGNOSIS — E782 Mixed hyperlipidemia: Principal | ICD-10-CM

## 2018-03-26 DIAGNOSIS — G9600 Cerebrospinal fluid leak, unspecified: Secondary | ICD-10-CM

## 2018-03-26 DIAGNOSIS — I2510 Atherosclerotic heart disease of native coronary artery without angina pectoris: Secondary | ICD-10-CM

## 2018-03-26 MED ORDER — ACETAZOLAMIDE 250 MG OR TABS
250.0000 mg | ORAL_TABLET | Freq: Three times a day (TID) | ORAL | 1 refills | Status: DC
Start: 2018-03-26 — End: 2018-05-30

## 2018-03-26 NOTE — Telephone Encounter (Signed)
Called and informed patient that refill for Diamox was sent in to pharmacy.

## 2018-03-26 NOTE — Telephone Encounter (Signed)
Prescription Refill Request     Incoming call from patient requesting refill     Prescribing provider: Dr. Leodis Liverpool   Last office visit: 01/06/2018  Next office visit: Visit date not found    Requested Medication/s:acetaZOLAMIDE (DIAMOX) 250 MG tablet  Sig: Take 1 tablet (250 mg) by mouth 3 times daily        Send to:       [id number] [address], [place]  [place] [address]  Phone: [phone] Fax: [phone]          Is patient out of medication? Yes     Has Patient been advised this message will be transmitted to office and if any issues can expect a response within the next 72 hours? Yes

## 2018-03-26 NOTE — Telephone Encounter (Signed)
Sent refill in for patient. Thanks!

## 2018-03-26 NOTE — Progress Notes (Signed)
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    He tried Lipitor, pravastatin, Zetia, and Crestor (unsure of Crestor dose).  Muscle weakness and pain with all of those medications, including Zetia alone was to the degree that he could not put clothes on, had to miss work.   He was tested negative for FH mutations.   Mentions that heart rate tends to run high, often in 90's. Had skipped beats couple years ago, none recently.  Below is the detailed summary of his trials of different statins, and prior lipid panel results:      Interval history:  Repatha was approved, and initially tolerated it well, however later started to experience pain in the area of sacrum and coccyx, extending down the hips. We spoke at the time, and although it did not appear to be a typical reaction to Praluent, decided to hold it for several months. There was no change in symptoms.  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   Has not been exercising due to concern of exacerbating the symptoms, but busy with his 25-monthold son.    Diagnostics (I reviewed all the test results below today):  EKG 09/26/16 (interpreted by me):  NSR, HR84    Coronary [place] report:    ADDENDUM REPORT: 12/31/2013 12:36  CLINICAL DATA: Risk stratification  EXAM:  Coronary Calcium Score  TECHNIQUE:  The patient was scanned on a Siemens Sensation 16 slice scanner.  Axial non-contrast 3 mm slices were carried out through the heart.  The data set was analyzed on a dedicated work station and scored  using the [place]  FINDINGS:  [facility]-cardiac: [facility].  Ascending Aorta: Normal size.  Pericardium: Normal  Coronary arteries: Originating in normal  position.  IMPRESSION:  Coronary calcium score of 6. This was 87percentile for age and sex  matched control.    ROS:  Constitutional: negative.    Eyes: negative.    Ears, Nose, Mouth, Throat: negative.    CV: as in HPI.  Resp: negative.    GI: negative.    GU: negative.    Musculoskeletal:negative.  Integumentary: negative.    Neuro: negative.    Psych: negative  Endo: negative.    Heme/Lymphatic: negative.    Allergy/Immun: negative.     Past Medical / Surgical History:  Past Medical History:   Diagnosis Date   . Acne    . Back pain     secondary to herniated disc, from back injury in 2000   . Hyperhidrosis     propanolol tx   . Hyperlipidemia     LDL > 200, TG ~400 at Dx, statin intolerant.  Per pt, r/o for familial HDL   . Parasomnia associated with sexual behavior     Tx with KCaprice Renshaw has been being downtitrated     Social History:  Social History     Socioeconomic History   . Marital status: Married     Spouse name: Not on file   . Number of children: Not on file   . Years of education: Not on file   . Highest education level: Not on file   Occupational History   .  Not on file   Social Needs   . Financial resource strain: Not on file   . Food insecurity:     Worry: Not on file     Inability: Not on file   . Transportation needs:     Medical: Not on file     Non-medical: Not on file   Tobacco Use   . Smoking status: Never Smoker   . Smokeless tobacco: Never Used   Substance and Sexual Activity   . Alcohol use: Yes     Comment: 1-3 drinks infrequently   . Drug use: Not on file   . Sexual activity: Not on file   Lifestyle   . Physical activity:     Days per week: Not on file     Minutes per session: Not on file   . Stress: Not on file   Relationships   . Social connections:     Talks on phone: Not on file     Gets together: Not on file     Attends religious service: Not on file     Active member of club or organization: Not on file     Attends meetings of clubs or organizations: Not on file     Relationship  status: Not on file   . Intimate partner violence:     Fear of current or ex partner: Not on file     Emotionally abused: Not on file     Physically abused: Not on file     Forced sexual activity: Not on file   Other Topics Concern   . Not on file   Social History Narrative    PhD in Psychology, works in [employer], [place] [employer]     Family History:  Family History   Problem Relation Name Age of Onset   . Cancer Mother          cervical   . No Known Problems Father     . Cancer Maternal Grandmother          ovarian   . No Known Cancer Maternal Grandfather     . Heart Disease Maternal Grandfather     . Cancer Paternal Grandmother          pancreatic   . Cancer Paternal Grandfather          mesothelioma   . Cancer Brother  22        osteosarcoma   Maternal grandfather - HLD, ?heart disease diagnosed in his 74's, family does not know details  Mother - HLD  Father - HLD on statin    Medications (medication list reviewed by me with the patient):  Current Outpatient Medications   Medication Sig   . acetaZOLAMIDE (DIAMOX) 250 MG tablet Take 1 tablet (250 mg) by mouth 3 times daily.   Marland Kitchen alirocumab (PRALUENT) 75 MG/ML SOPN injection pen INJECT 1 ML (75 MG) UNDER THE SKIN EVERY 14 DAYS.   Marland Kitchen clonazePAM (KLONOPIN) 0.5 MG tablet Take 0.5 tablets (0.25 mg) by mouth at bedtime.   Marland Kitchen desonide (DESOWEN) 0.05 % cream Apply 1 Application topically 2 times daily. apply to affected areas   . propranolol (INDERAL LA) 60 MG CP24 Take 1 capsule (60 mg) by mouth daily.   Marland Kitchen tretinoin (RETIN-A) 0.025 % cream Use a pea sized amount on the entire face nightly.  At first, you may use every other night to avoid dryness and irritation, and gradually increase use to nightly as tolerated  No current facility-administered medications for this visit.    propranolol for hyperhydrosis    Allergies:  Patient is allergic to atorvastatin; pravastatin; rosuvastatin; and zetia [ezetimibe].    Physical Exam:  BP 113/65 (BP Location: Left arm, BP Patient  Position: Sitting, BP cuff size: Regular)   Pulse 89   Temp 98 F (36.7 C) (Oral)   Resp 16   Ht 5' 7.99" (1.727 m)   Wt 74.8 kg (165 lb)   SpO2 100%   BMI 25.09 kg/m  /    General: awake, alert, no acute distress  HEENT: NCAT, PERRL, EOMI  Neck: JVP ~7cm, no carotid bruits  Lungs: clear to auscultation bilaterally, no wheezing/crackles  Heart: RRR, nl S1S2, no murmurs, rubs or gallops  Abdomen: +BS, soft, non-tender, non-distended  Extremities: warm, well-perfused, no edema,  2+ symmetric distal pulses    Lab Data (reviewed by me today):  Lab Results   Component Value Date    BUN 14 07/15/2017    CREAT 1.04 07/15/2017    CL 108 (H) 02/14/2018    NA 139 02/14/2018    K 3.6 02/14/2018    [place] 9.7 07/15/2017    TBILI 0.54 07/15/2017    ALB 4.6 07/15/2017    TP 7.4 07/15/2017    AST 22 07/15/2017    ALK 64 07/15/2017    BICARB 19 (L) 02/14/2018    ALT 20 07/15/2017    GLU 91 07/15/2017     Lab Results   Component Value Date    WBC 4.4 02/11/2016    RBC 5.04 02/11/2016    HGB 15.5 02/11/2016    HCT 44.9 02/11/2016    MCV 89.1 02/11/2016    MCHC 34.5 02/11/2016    RDW 11.5 (L) 02/11/2016    PLT 245 02/11/2016    MPV 9.3 (L) 02/11/2016     Lab Results   Component Value Date    CHOL 163 07/15/2017    HDL 54 07/15/2017    LDLCALC 88 07/15/2017    TRIG 105 07/15/2017     Lab Results   Component Value Date    TSH 1.16 02/11/2016       Assessment/Plan:  *hyperlipidemia:   *statin intolerance:  *Coronary calcification (64 percentile for age and sex):  -attempted statin re-challenge, and the patient was only able to tolerate 2.5 of rosuvastatin QOD, unable to tolerate 5 mg QOD due to muscle aches. Inadequate control of LDL with 2.5 mg of rosuvastatin QOD in a young patient with very high Leona score.  Praluent was authorized, and tolerated it well, unlikely pain symptoms are related to PCSK9 inhibitor, therefore, OK to resume.     Of note, in the past the patient already tried Lipitor, pravastatin, Zetia,and all caused  debilitating myalgias.   Continue dietary modifications.    Orders Placed This Encounter   Procedures   . Lipid Panel Green Plasma Separator Tube   . Comprehensive Metabolic Panel Green Plasma Separator Tube     There are no Patient Instructions on file for this visit.    Return in about 2 years (around 03/26/2020).      Charline Bills, MD    Assistant Clinical Professor of Medicine  Division of [place]  Pager [phone]  [email or web address] .Bennie Pierini

## 2018-03-27 ENCOUNTER — Telehealth (INDEPENDENT_AMBULATORY_CARE_PROVIDER_SITE_OTHER): Payer: Self-pay | Admitting: Neurosurgery

## 2018-03-27 NOTE — Telephone Encounter (Signed)
Patient called to secure appt with Dr. Cherrie Gauze, per patient needs referral that he believes Dr. Cherrie Gauze has more authority to write.scheduled fro 05/08/2018 @ 11:30am.  Patient is also scheduled with Dr. Devin Going for 05/12/2018 @ 8:30 am  for pseudomeningocele. Please advise if appts are appropriate.

## 2018-03-28 ENCOUNTER — Other Ambulatory Visit: Payer: Self-pay

## 2018-03-28 ENCOUNTER — Ambulatory Visit (HOSPITAL_BASED_OUTPATIENT_CLINIC_OR_DEPARTMENT_OTHER): Payer: No Typology Code available for payment source | Admitting: Physical Therapy

## 2018-03-28 ENCOUNTER — Ambulatory Visit
Admission: RE | Admit: 2018-03-28 | Discharge: 2018-03-28 | Disposition: A | Discharge status: Home / Self Care | Payer: No Typology Code available for payment source | Attending: Pain Medicine | Admitting: Pain Medicine

## 2018-03-28 ENCOUNTER — Encounter (HOSPITAL_BASED_OUTPATIENT_CLINIC_OR_DEPARTMENT_OTHER): Admission: RE | Disposition: A | Discharge status: Home Health | Payer: Self-pay | Attending: Pain Medicine

## 2018-03-28 ENCOUNTER — Ambulatory Visit (HOSPITAL_BASED_OUTPATIENT_CLINIC_OR_DEPARTMENT_OTHER): Payer: Self-pay

## 2018-03-28 DIAGNOSIS — M545 Low back pain: Secondary | ICD-10-CM | POA: Insufficient documentation

## 2018-03-28 DIAGNOSIS — E785 Hyperlipidemia, unspecified: Secondary | ICD-10-CM | POA: Insufficient documentation

## 2018-03-28 DIAGNOSIS — M533 Sacrococcygeal disorders, not elsewhere classified: Secondary | ICD-10-CM | POA: Insufficient documentation

## 2018-03-28 DIAGNOSIS — M549 Dorsalgia, unspecified: Secondary | ICD-10-CM | POA: Insufficient documentation

## 2018-03-28 DIAGNOSIS — R61 Generalized hyperhidrosis: Secondary | ICD-10-CM | POA: Insufficient documentation

## 2018-03-28 SURGERY — PAIN TPI (1 OR 2 MUSCLES)
Anesthesia: Local

## 2018-03-28 MED ORDER — BUPIVACAINE HCL (PF) 0.25 % IJ SOLN
INTRAMUSCULAR | Status: DC
Start: 2018-03-28 — End: 2018-03-28
  Filled 2018-03-28: qty 10

## 2018-03-28 MED ORDER — DEXAMETHASONE SOD PHOSPHATE PF 10 MG/ML IJ SOLN
INTRAMUSCULAR | Status: DC
Start: 2018-03-28 — End: 2018-03-28
  Filled 2018-03-28: qty 1

## 2018-03-28 MED ORDER — BUPIVACAINE HCL (PF) 0.25 % IJ SOLN
INTRAMUSCULAR | Status: DC | PRN
Start: 2018-03-28 — End: 2018-03-28
  Administered 2018-03-28 (×2): 5 mL via INTRAMUSCULAR

## 2018-03-28 MED ORDER — IOHEXOL 240 MG/ML IJ SOLN
INTRAMUSCULAR | Status: DC
Start: 2018-03-28 — End: 2018-03-28
  Filled 2018-03-28: qty 10

## 2018-03-28 MED ORDER — DEXAMETHASONE SOD PHOSPHATE PF 10 MG/ML IJ SOLN
INTRAMUSCULAR | Status: DC | PRN
Start: 2018-03-28 — End: 2018-03-28
  Administered 2018-03-28: 5 mg

## 2018-03-28 MED ORDER — IOHEXOL 240 MG/ML IJ SOLN
INTRAMUSCULAR | Status: DC | PRN
Start: 2018-03-28 — End: 2018-03-28
  Administered 2018-03-28: 1 mL

## 2018-03-28 SURGICAL SUPPLY — 18 items
APPLICATOR CHLORAPREP 3ML, CLEAR (Misc Medical Supply) ×2 IMPLANT
COVER ULTRASOUND PROBE W/ GEL (Drapes/towels) IMPLANT
GLOVE BIOGEL PI ULTRATOUCH SIZE 7 (Gloves/gowns) IMPLANT
GLOVE BIOGEL PI ULTRATOUCH SIZE 7.5 (Gloves/gowns)
GLOVE BIOGEL PI ULTRATOUCH SIZE 8 (Gloves/gowns)
MARKER SECURELINE SURG SKIN (Misc Medical Supply) ×2 IMPLANT
NEEDLE BD HYPO 27G X 1.25" (Needles/punch/cannula/biopsy)
NEEDLE BD HYPO 30G X 1" (Needles/punch/cannula/biopsy)
NEEDLE ECHOBLOCK MSK 21G X 3 1/8" (Needles/punch/cannula/biopsy) IMPLANT
NEEDLE ECHOBLOCK MSK 21G X 4" (Needles/punch/cannula/biopsy) IMPLANT
NEEDLE ECHOBLOCK MSK 22G X 2" (Needles/punch/cannula/biopsy) IMPLANT
NEEDLE PROTECT 18G X 1.5" (Needles/punch/cannula/biopsy)
NEEDLE PROTECT 25G X 1.5" (Needles/punch/cannula/biopsy)
NEEDLE SPINE QUINCKE 23G X 3.5" (Needles/punch/cannula/biopsy)
NEEDLE SPINE QUINCKE 23G X 5" (Needles/punch/cannula/biopsy)
NEEDLE SPINE QUINCKE 25G X 3.5" (Needles/punch/cannula/biopsy)
NEEDLE SPINE QUINCKE 25G X 6" (Needles/punch/cannula/biopsy) IMPLANT
TRAY SINGLE SHOT EPIDURAL (Procedure Packs/kits) ×2

## 2018-03-28 NOTE — Telephone Encounter (Signed)
LVM for patient to clarify why he needed an appointment with both neurosurgeons, since both are capable of providing referral if required.     Requested call back with details.

## 2018-03-28 NOTE — H&P (Signed)
Ambulatory Surgery/Invasive Procedure History and Physical      Primary Care Physician [name]    Chief Complaint: tailbone pain    37 year old male here for scheduled procedure.    There were no vitals taken for this visit.    Past Medical History:   Diagnosis Date   . Acne    . Back pain     secondary to herniated disc, from back injury in 2000   . Hyperhidrosis     propanolol tx   . Hyperlipidemia     LDL > 200, TG ~400 at Dx, statin intolerant.  Per pt, r/o for familial HDL   . Parasomnia associated with sexual behavior     Tx with Klonapin, has been being downtitrated       Allergies   Allergen Reactions   . Atorvastatin Other     Debilitating myalgias   . Pravastatin Other     Debilitating myalgias   . Rosuvastatin Other     Debilitating myalgias   . Zetia [Ezetimibe] Other     Debilitating myalgias       Current Facility-Administered Medications   Medication Dose Route Frequency Provider Last Rate Last Dose   . bupivacaine 0.25 % PF injection  - ADS OVERRIDE            . dexAMETHasone (DECADRON) 10 MG/ML PF injection  - ADS OVERRIDE            . iohexol (OMNIPAQUE 240) 240 MG/ML solution  - ADS OVERRIDE                I have reviewed the past medical history, allergies and current medications as documented in the electronic health record.      Physical Exam       Can this patient make their own healthcare decisions?  Yes    Chest:  Breaths easily     Heart:  RRR    Abdomen:  Soft    Pain Management Needs/Options discussed.    No Advanced Directives.      Resuscitative Status:  Full Code, Full Care    Diagnosis:    ICD-10-CM ICD-9-CM    1. Coccygeal pain M53.3 724.79     Added automatically from request for surgery [phone]       Procedure: XR guided sacrococcygeal ligament and ganglion impar blocks    The procedure is designed to be therapeutic.    Discussed Risks, Benefits, and Alternatives to procedure.  Questions answered.  Patient voiced understanding and wished to proceed. Consent Signed.    See  procedure note of same date.

## 2018-03-28 NOTE — RN OR/Procedure Note (Signed)
Pt brought to procedure room via wheelchair.   Report received from Wanda, RN  Consent signed with Dr. Schuster prior to procedure  Pt able to position self on procedure table without assistance or difficulty.  Pt placed on NIBP and Spo2 for monitoring  All VSS  Pt tolerated procedure well.  Pt assisted off procedure table to taken to recovery area via wheelchair.

## 2018-03-28 NOTE — Telephone Encounter (Signed)
Routed to care teams to advise.     Uncertain why patient would need appointment with both.

## 2018-03-28 NOTE — Discharge Instructions (Signed)
Patient verbalized understanding of below instructions    Procedure Done Today: Trigger point injection sacrococcygeal ligament injection and ganglion impar block with X-ray    Follow up: Pain clinic in 4 weeks    Post Procedure Instructions  Continue present medication unless otherwise indicated.  Resume your normal diet after being discharge.  Ice pack to treatment site (no more than 20 minutes at a time) if needed for pain relief and/or muscle spasm.  Avoid strenuous activities and driving until tomorrow morning.  If you have a band-aid dressing, you may remove it tomorrow morning.  Resume normal activities tomorrow morning, unless otherwise directed.  No submersion in water for 24 hrs.  If your next appointment is at the Pain Clinic, then please call [phone] to make an appointment.  If your next appointment is another procedure, then please call [phone] to make an appointment.    Call the [facility],  [phone] and ask for the pain management provider on call for ANY sign of:  Temperature above 101.5 F  Redness or drainage at the treatment site  Severe, uncontrollable headache    Call [facility]

## 2018-03-28 NOTE — Op Note (Signed)
Preoperative Diagnosis: coccydynia      Postoperative Diagnosis: coccydynia      Procedure:  1. Ganglion impar block  2. Sacrococcygeal Ligament Injection. 3. Fluoroscopy for needle guidance.     Surgeon/Staff:  Peter Minium, MD  Assistant: .      Anesthesia:  Lidocaine 1%        Indications: The patient c/o internal tailbone pain. The patient's history and physical findings are consistent with coccydynia. They are here for an injection at the site thought to be the source of their pain.  This is the first such injection performed for this patient.     Procedure in detail:  Written informed consent was obtained.  The chart was reviewed, questions were answered and the patient wished to proceed. The patient had no contraindications to the procedure.  Vital signs were stable. Standard monitoring was applied. The patient, the procedure nurse, and the physician confirmed the site of injection after an official "time out." The skin over the injection site was also marked and confirmed as the site of the injection.     Localization Time Out:  An additional intraoperative timeout, specifically to confirm accurate localization, was conducted by the attending physician.      The patient remained awake and alert throughout the procedure.  The patient was placed in the prone position.      A 3 ml mixture of bupivacaine 0.5% 2.5 ml with dexamethasone 10mg /ml 0.5 ml was prepared.     ?The skin over the lower back and sacral region was prepped with chlorhexidine and draped in a sterile fashion. Using a lateral fluoroscopy view the sacral-coccygeal junction was identified. The skin overlying the target was anesthetized with 1% lidocaine plain using a 27 gauge 1.25 inch needle. Then the 27 gauge 1.5 inch needle was advanced toward the anterior surface of the sacrococcygeal ligament under fluoroscopic guidance. No paresthesia were noted with needle placement. The needle was advanced through the sacrococcygeal disc so that  needle point was just beyond the anterior border. Correct needle position was confirmed using 88ml non-ionic contrast dye. After negative aspiration, 2 ml of the above mixture was incrementally injected with intermittent negative aspirations. Then TPI was performed of sacroccygeal ligament with 2.5 ml of bupivacaine 0.25%. The needle was removed, skin cleansed and a sterile bandage was applied.     The patient did not experience any hemodynamic or neurologic sequelae.  The attending physician was present during all critical points of the procedure.     The patient was transferred to the recovery area.      Post operative instructions were explained and given to the  patient. The patient was discharged in stable health and can follow up via MyChart or in clinic in 1 month.

## 2018-03-31 NOTE — Telephone Encounter (Signed)
CC: Please schedule pt with Dr Selinda Eon when she calls back.

## 2018-04-02 ENCOUNTER — Encounter (INDEPENDENT_AMBULATORY_CARE_PROVIDER_SITE_OTHER): Payer: Self-pay | Admitting: Internal Medicine

## 2018-04-02 NOTE — Telephone Encounter (Signed)
Routed to MD as FYI.

## 2018-04-02 NOTE — Telephone Encounter (Signed)
From: [name], PhD  To: Stefan Church  Sent: 04/01/2018 5:00 PM PST  Subject: Regarding Rx    Hi Caryl Pina, thanks for the note, totally understand. Clarene Critchley and I tried scheduling but Dr. Selinda Eon and I didn't have any overlapping availability left in 2019. I'll try calling again and see what might work based on some recent client cancellations on my end :)    [name]    ----- Message -----  From: Stefan Church  Sent: 04/01/18, 15:19  To: [name], PhD  Subject: Regarding Rx     Good Boykin Nearing,    We have been trying to contact you, but haven't been successful on reaching you.   Dr Selinda Eon is not able to fill your prescription for clonazepam without you making an appt to see him.  You haven't been seen since 01.11.19.  Please at your earliest convenience give [place] a call at [phone] to schedule.     Thank you!  Hope you are well,  Happy Holidays!    -AshleyV

## 2018-04-04 ENCOUNTER — Encounter (INDEPENDENT_AMBULATORY_CARE_PROVIDER_SITE_OTHER): Payer: Self-pay | Admitting: Internal Medicine

## 2018-04-04 ENCOUNTER — Ambulatory Visit (INDEPENDENT_AMBULATORY_CARE_PROVIDER_SITE_OTHER): Payer: No Typology Code available for payment source | Admitting: Internal Medicine

## 2018-04-04 ENCOUNTER — Other Ambulatory Visit: Payer: Self-pay

## 2018-04-04 VITALS — BP 132/74 | HR 74 | Resp 14 | Ht 67.99 in | Wt 165.0 lb

## 2018-04-04 DIAGNOSIS — R030 Elevated blood-pressure reading, without diagnosis of hypertension: Secondary | ICD-10-CM

## 2018-04-04 DIAGNOSIS — G4750 Parasomnia, unspecified: Secondary | ICD-10-CM

## 2018-04-04 DIAGNOSIS — Z1389 Encounter for screening for other disorder: Secondary | ICD-10-CM

## 2018-04-04 DIAGNOSIS — Z1339 Encounter for screening examination for other mental health and behavioral disorders: Secondary | ICD-10-CM

## 2018-04-04 DIAGNOSIS — E785 Hyperlipidemia, unspecified: Principal | ICD-10-CM

## 2018-04-04 MED ORDER — CLONAZEPAM 0.5 MG OR TABS
0.50 mg | ORAL_TABLET | Freq: Every evening | ORAL | 0 refills | Status: AC
Start: 2018-04-04 — End: ?

## 2018-04-04 NOTE — Patient Instructions (Addendum)
Keep working with pain clinic and [facility] as you have been  Let me know if I can help anymore    Refills sent

## 2018-04-04 NOTE — Progress Notes (Signed)
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   The patient has presents for Medication Review  37 YO man with a history of HLD, parasomnia, hyperhidrosis, and chronic coccyx/sacral pain w/ radiationpresents to followup.    HLD - on Praluent.  On hold for now --?2/2 possible rare mylagias.  Overall was going well however.     Pain, following closely with pain clinic.  Dural tear.  Recent TPI  Saw Drs. Alroy Bailiff and Octa.  Planning next steps, maybe at [facility] vs here vs [facility].    Parasomnias - Rare episodes @ night, but still occurring  Behavioral health interventions have been somewhat helpful, but not always  Wife tries to wake patient up - has helped some  Hopes for refill today and aware of risk and gets benefit and benefit>risk          Health care maintenance:  VACCINES:   - Flu shot:    -Tetanus:    - PSV/PCV:    - HPV/gardasil:   - zoster:   MALIG SCREEN:   -Cervical:   -Breast:   -CRC:   -Lung:   GENERAL:   - DXA:   -FALLS:     - DM2:  OTHER:   -     Review of Systems:  Review of Systems   Constitutional: Negative for chills, fever and malaise/fatigue.   Respiratory: Negative for cough and shortness of breath.    Skin: Negative for rash.   Neurological: Negative for dizziness and headaches.       Past Medical History:  Patient Active Problem List   Diagnosis   . Hyperlipidemia   . Back pain   . Parasomnia   . Acne, unspecified acne type   . Hyperhidrosis   . Dural tear   . Postlaminectomy syndrome of lumbar region   . Coccydynia   . Eyelid lesion   . Post laminectomy syndrome   . Lumbar radiculopathy, chronic   . Sacral radiculopathy   . Intracranial hypotension   . Sphenopalatine ganglion neuralgia   . CSF leak   . Coccygeal pain       Medications were reviewed and updated on the Active Medication List    Medications:    Current Outpatient Medications:   .   acetaZOLAMIDE (DIAMOX) 250 MG tablet, Take 1 tablet (250 mg) by mouth 3 times daily., Disp: 90 tablet, Rfl: 1  .  alirocumab (PRALUENT) 75 MG/ML SOPN injection pen, INJECT 1 ML (75 MG) UNDER THE SKIN EVERY 14 DAYS., Disp: 2 mL, Rfl: 3  .  clonazePAM (KLONOPIN) 0.5 MG tablet, Take 1 tablet (0.5 mg) by mouth at bedtime., Disp: 30 tablet, Rfl: 0  .  desonide (DESOWEN) 0.05 % cream, Apply 1 Application topically 2 times daily. apply to affected areas, Disp: 15 g, Rfl: 1  .  propranolol (INDERAL LA) 60 MG CP24, Take 1 capsule (60 mg) by mouth daily., Disp: 90 each, Rfl: 3  .  tretinoin (RETIN-A) 0.025 % cream, Use a pea sized amount on the entire face nightly.  At first, you may use every other night to avoid dryness and irritation, and gradually increase use to nightly as tolerated, Disp: 45 g, Rfl: 3    Psychosocial:  Social History     Socioeconomic History   . Marital status: Married  Spouse name: Not on file   . Number of children: Not on file   . Years of education: Not on file   . Highest education level: Not on file   Occupational History   . Not on file   Social Needs   . Financial resource strain: Not on file   . Food insecurity:     Worry: Not on file     Inability: Not on file   . Transportation needs:     Medical: Not on file     Non-medical: Not on file   Tobacco Use   . Smoking status: Never Smoker   . Smokeless tobacco: Never Used   Substance and Sexual Activity   . Alcohol use: Yes     Comment: 1-3 drinks infrequently   . Drug use: Not on file   . Sexual activity: Not on file   Lifestyle   . Physical activity:     Days per week: Not on file     Minutes per session: Not on file   . Stress: Not on file   Relationships   . Social connections:     Talks on phone: Not on file     Gets together: Not on file     Attends religious service: Not on file     Active member of club or organization: Not on file     Attends meetings of clubs or organizations: Not on file     Relationship status: Not on file   .  Intimate partner violence:     Fear of current or ex partner: Not on file     Emotionally abused: Not on file     Physically abused: Not on file     Forced sexual activity: Not on file   Other Topics Concern   . Not on file   Social History Narrative    PhD in Psychology, works in [employer], [employer]       Allergies   Allergen Reactions   . Atorvastatin Other     Debilitating myalgias   . Pravastatin Other     Debilitating myalgias   . Rosuvastatin Other     Debilitating myalgias   . Zetia [Ezetimibe] Other     Debilitating myalgias       The Family History:  Family History   Problem Relation Name Age of Onset   . Cancer Mother          cervical   . No Known Problems Father     . Cancer Maternal Grandmother          ovarian   . No Known Cancer Maternal Grandfather     . Heart Disease Maternal Grandfather     . Cancer Paternal Grandmother          pancreatic   . Cancer Paternal Grandfather          mesothelioma   . Cancer Brother  17        osteosarcoma       Physical Exam:  BP 132/74 (BP Location: Left arm, BP Patient Position: Sitting, BP cuff size: Large)   Pulse 74   Resp 14   Ht 5' 7.99" (1.727 m)   Wt 74.8 kg (165 lb)   SpO2 98%   BMI 25.10 kg/m   Body mass index is 25.1 kg/m.  Physical Exam   Constitutional: He is well-developed, well-nourished, and in no distress. No distress.   HENT:  Head: Normocephalic and atraumatic.   Eyes: Conjunctivae are normal. No scleral icterus. Musculoskeletal: Normal range of motion.     Neurological: He is alert.   Skin: Skin is warm and dry. He is not diaphoretic.   Psychiatric: Affect normal.   Vitals reviewed.      Studies  Lab Results   Component Value Date    BUN 14 07/15/2017    CREAT 1.04 07/15/2017    CL 108 (H) 02/14/2018    NA 139 02/14/2018    K 3.6 02/14/2018    [place] 9.7 07/15/2017    TBILI 0.54 07/15/2017    ALB 4.6 07/15/2017    TP 7.4 07/15/2017    AST 22 07/15/2017    ALK 64 07/15/2017    BICARB 19 (L) 02/14/2018    ALT 20 07/15/2017    GLU 91  07/15/2017     No results found for: A1C  Lab Results   Component Value Date    AST 22 07/15/2017    ALT 20 07/15/2017    ALK 64 07/15/2017    TP 7.4 07/15/2017    ALB 4.6 07/15/2017    TBILI 0.54 07/15/2017     Lab Results   Component Value Date    CHOL 163 07/15/2017    HDL 54 07/15/2017    LDLCALC 88 07/15/2017    TRIG 105 07/15/2017     No results found for: GFRAA  Lab Results   Component Value Date    WBC 4.4 02/11/2016    RBC 5.04 02/11/2016    HGB 15.5 02/11/2016    HCT 44.9 02/11/2016    MCV 89.1 02/11/2016    RDW 11.5 (L) 02/11/2016    PLT 245 02/11/2016    LYMPHS 42 02/11/2016    MONOS 10 02/11/2016    EOS 6 02/11/2016    BASOS 1 02/11/2016     No results found for: INR  No results found for: COLORUA, APPEARUA, [place], BILIUA, KETONEUA, [place], BLOODUA, [place], PROTEINUA, UROBILUA, NITRITEUA, LEUKESTUA, WBCUA, RBCUA, EPITHCELLSUA, HYALINEUA, CRYSTALSUA, COMMENTSUA    The ASCVD Risk score Mikey Bussing DC Jr., et al., 2013) failed to calculate for the following reasons:    The 2013 ASCVD risk score is only valid for ages 22 to 27      Imaging: defer      Health Maintenance: was reviewed, key updates defer    ASSESSMENT AND PLAN  Erik Limbo, 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  Has cut down exercise, temporarily but will resume pending plan of care for back    Hyperlipidemia, unspecified hyperlipidemia type    Elevated blood pressure reading without diagnosis of hypertension    Screened negative for alcohol use    Screened negative for drug use    Parasomnia - continue episodes of parasomnia, however improved overall over the last to 2 years; has been working on behavioral changes as well with success.  However continues to need medications and  has tried numerous alternatives.  Gets benefit with clonazepam in uses the lowest dose for affect.  Reasonable to refill and sent today.  -     clonazePAM (KLONOPIN) 0.5 MG tablet; Take 1 tablet (  0.5 mg) by mouth at bedtime.          Patient Instructions   Keep working with pain clinic and [facility] as you have been  Let me know if I can help anymore    Refills sent          Orders this encounter:  Lab No orders of the defined types were placed in this encounter.    Imaging No orders of the defined types were placed in this encounter.    Procedures No orders of the defined types were placed in this encounter.    Other No orders of the defined types were placed in this encounter.      Return in about 1 year (around 04/05/2019) for annual.    .

## 2018-04-11 ENCOUNTER — Telehealth (INDEPENDENT_AMBULATORY_CARE_PROVIDER_SITE_OTHER): Payer: Self-pay | Admitting: Internal Medicine

## 2018-04-11 DIAGNOSIS — Z20828 Contact with and (suspected) exposure to other viral communicable diseases: Secondary | ICD-10-CM

## 2018-04-11 NOTE — Telephone Encounter (Addendum)
Who is calling: Incoming call from patient  Insurance Coverage Verified: Active- in network  Reason for this call: Patient calling and is requesting a prescription for Tamiflu. Has been exposed to spouse who was diagnosed with Influenza A on 04/10/2018. Suggested by his spouse's physician to call his pcp. Patient is NOT having symptoms    [id number] - [place], [place] - [address] AT [place]    Action required by office: Please expedite request and Please contact caller    Duplicate encounter? No previous documentation found on this issue.     Best way to contact: [phone]  Alternative: [phone]    Inquiry has been read verbatim to this caller. Verbalizes satisfaction and confirms the above is accurate: yes      Has been advised this message will be transmitted to office and can expect a response within the next 24-72 hours.

## 2018-04-14 ENCOUNTER — Other Ambulatory Visit: Payer: Self-pay

## 2018-04-14 MED ORDER — OSELTAMIVIR PHOSPHATE 75 MG OR CAPS
75.00 mg | ORAL_CAPSULE | Freq: Every day | ORAL | 0 refills | Status: DC
Start: 2018-04-14 — End: 2018-06-25

## 2018-04-14 NOTE — Telephone Encounter (Signed)
Routed to MD to Advise.  Pended Medication if agreeable.  No Symptoms, just exposed

## 2018-04-17 ENCOUNTER — Encounter (INDEPENDENT_AMBULATORY_CARE_PROVIDER_SITE_OTHER): Payer: Self-pay | Admitting: Neurological Surgery

## 2018-04-17 ENCOUNTER — Telehealth (INDEPENDENT_AMBULATORY_CARE_PROVIDER_SITE_OTHER): Payer: Self-pay | Admitting: Neurological Surgery

## 2018-04-17 NOTE — Telephone Encounter (Signed)
Patient is calling to provide MD with Dr. Iva Lento Neuro-radiologist from [facility]. Ph. [phone]) personal cell. Patient would like the MD to contact her prior to his upcoming appt on 05/12/2017 to discuss his case. Please advise       Patient phone [phone]

## 2018-04-22 ENCOUNTER — Encounter (HOSPITAL_BASED_OUTPATIENT_CLINIC_OR_DEPARTMENT_OTHER): Payer: Self-pay | Admitting: Pain Medicine

## 2018-04-22 NOTE — Progress Notes (Deleted)
Delete.

## 2018-04-25 ENCOUNTER — Encounter (HOSPITAL_BASED_OUTPATIENT_CLINIC_OR_DEPARTMENT_OTHER): Payer: Self-pay | Admitting: Pain Medicine

## 2018-04-25 DIAGNOSIS — G4701 Insomnia due to medical condition: Secondary | ICD-10-CM

## 2018-04-25 DIAGNOSIS — G8929 Other chronic pain: Secondary | ICD-10-CM

## 2018-04-25 MED ORDER — TIZANIDINE HCL 2 MG OR TABS
ORAL_TABLET | ORAL | 0 refills | Status: DC
Start: 2018-04-25 — End: 2018-06-03

## 2018-04-25 NOTE — Progress Notes (Signed)
Rx sent to pharmacy for trial of tizanidine 2mg  1-2 tabs nightly PRN for pain/sleep

## 2018-04-29 ENCOUNTER — Other Ambulatory Visit: Payer: Self-pay

## 2018-05-02 ENCOUNTER — Telehealth (HOSPITAL_BASED_OUTPATIENT_CLINIC_OR_DEPARTMENT_OTHER): Payer: Self-pay | Admitting: Pain Medicine

## 2018-05-02 ENCOUNTER — Encounter (INDEPENDENT_AMBULATORY_CARE_PROVIDER_SITE_OTHER): Payer: Self-pay

## 2018-05-02 ENCOUNTER — Encounter (HOSPITAL_BASED_OUTPATIENT_CLINIC_OR_DEPARTMENT_OTHER): Payer: Self-pay | Admitting: Pain Medicine

## 2018-05-02 DIAGNOSIS — M533 Sacrococcygeal disorders, not elsewhere classified: Secondary | ICD-10-CM

## 2018-05-02 DIAGNOSIS — M5416 Radiculopathy, lumbar region: Secondary | ICD-10-CM

## 2018-05-02 NOTE — Telephone Encounter (Signed)
Pt is requesting that Dr. Thereasa Parkin place a new order for PT. The other referral has expired. Thank you.

## 2018-05-05 ENCOUNTER — Encounter (HOSPITAL_BASED_OUTPATIENT_CLINIC_OR_DEPARTMENT_OTHER): Payer: Self-pay | Admitting: Pain Medicine

## 2018-05-06 ENCOUNTER — Encounter (HOSPITAL_BASED_OUTPATIENT_CLINIC_OR_DEPARTMENT_OTHER): Payer: Self-pay | Admitting: Pain Medicine

## 2018-05-06 DIAGNOSIS — M5416 Radiculopathy, lumbar region: Principal | ICD-10-CM

## 2018-05-06 NOTE — Telephone Encounter (Signed)
PT ordered. All matters resolved. Closing.

## 2018-05-06 NOTE — Progress Notes (Signed)
Order placed for L L4-5 ILESI as per 03/05/2018 clinic visit note

## 2018-05-08 ENCOUNTER — Encounter (INDEPENDENT_AMBULATORY_CARE_PROVIDER_SITE_OTHER): Payer: No Typology Code available for payment source | Admitting: Neurosurgery

## 2018-05-08 ENCOUNTER — Encounter

## 2018-05-09 ENCOUNTER — Ambulatory Visit (HOSPITAL_BASED_OUTPATIENT_CLINIC_OR_DEPARTMENT_OTHER): Payer: No Typology Code available for payment source | Admitting: Physical Therapy

## 2018-05-11 ENCOUNTER — Other Ambulatory Visit: Payer: Self-pay

## 2018-05-12 ENCOUNTER — Encounter

## 2018-05-12 ENCOUNTER — Encounter (INDEPENDENT_AMBULATORY_CARE_PROVIDER_SITE_OTHER): Payer: Self-pay | Admitting: Neurological Surgery

## 2018-05-12 ENCOUNTER — Ambulatory Visit (INDEPENDENT_AMBULATORY_CARE_PROVIDER_SITE_OTHER): Payer: No Typology Code available for payment source | Admitting: Neurological Surgery

## 2018-05-12 VITALS — BP 118/71 | HR 95 | Temp 97.7°F | Ht 67.99 in | Wt 165.0 lb

## 2018-05-12 DIAGNOSIS — G9619 Other disorders of meninges, not elsewhere classified: Secondary | ICD-10-CM

## 2018-05-12 DIAGNOSIS — G96198 Other disorders of meninges, not elsewhere classified: Secondary | ICD-10-CM

## 2018-05-12 DIAGNOSIS — G9600 Cerebrospinal fluid leak, unspecified: Secondary | ICD-10-CM

## 2018-05-12 MED ORDER — ALIROCUMAB 150 MG/ML SC SOAJ
SUBCUTANEOUS | Status: DC
Start: ? — End: 2018-05-14

## 2018-05-13 ENCOUNTER — Encounter (INDEPENDENT_AMBULATORY_CARE_PROVIDER_SITE_OTHER): Payer: Self-pay | Admitting: Neurological Surgery

## 2018-05-13 ENCOUNTER — Ambulatory Visit (HOSPITAL_BASED_OUTPATIENT_CLINIC_OR_DEPARTMENT_OTHER): Payer: No Typology Code available for payment source | Admitting: Physical Therapy

## 2018-05-13 NOTE — Progress Notes (Signed)
HISTORY OF PRESENTING ILLNESS  This is a very pleasant 38 year old male (preferred name Erik Burke) who presents with a history of CSF hypotension and ventral spinal pseudomeningocele.    He does have a fairly complicated history. In brief, he had been diagnosed with CSF hypotension at [facility] due to a CSF spinal leak and underwent multiple blood and fibrin glue patches by Dr. Iva Lento. His original symptoms of severe headaches that improved on supine positioning improved after 7-8 patches throughout the spine, with the last being in 2013.    He has since relocated to [facility] and more recently around 2017, he has re-developed similar but slightly different symptoms of sacral pain, as well as pressure behind the eyes and slightly behind the head. These do not always go away with supine positioning, and instead he notices that he cannot sit for prolonged periods of time and being upright with walking improves these symptoms. He does also state that he has neck pain with visual symptoms intermittently as well.     He is here today to discuss potential treatment options. He has been in touch with Dr. Pryor Curia at [facility], as well as Dr. Trinna Balloon at [place]. He is here today with his wife Erik Burke.    Past medical history: None  Past surgical history: Lumbar laminectomy/discectomy  Allergies: Statins  Medications: Reviewed in chart  Family history: Noncontributory  Social history: He is a nonsmoker, he works as a PhD Press photographer    PHYSICAL EXAM  On physical examination, pupils are reactive. Extraocular movements are intact, face is symmetric and tongue is midline. There is no pronator drift. Bilateral upper and lower extremity strength is 5/5 throughout. Sensation is intact. There is a negative Romberg.    RADIOGRAPHIC IMAGING  I did review multiple CT and MRI neurologic imaging studies in his file, including his most recent myelogram dated October 2019 as well as a brain MRI dated April 2019. The  myelogram shows what appears to be a ventral pseudomeningocele that extends from C2-3 down to T8.  The likely source of the leak appears to be around the area of C7-T1.  His brain MRI from April did not currently show any active CSF hypotension or brain sag.      ASSESSMENT/PLAN  My assessment is that this is a 38 year old male who is presenting with a ventral cervicothoracic spinal pseudomeningocele potentially causing CSF hypotension symptoms.    We did go over all of his imaging together. I pointed out the pseudomeningocele on his myelogram which he could clearly see on the sagittal and axial imaging.      We then went over the potential treatment options moving forward.  We had a long discussion that his situation and clinical condition is obviously quite uncommon.  Due to this, the treatment path moving forward would be tailored specifically to him but could not be guaranteed to provide any clinical improvement.    I explained that although we cannot be known for sure, due to the presence of the pseudomeningocele and collection of CSF in front of the dural tear, the likelihood of a blood or fibrin glue patch being successful may be less because the existing CSF liquid may prevent the hardening of that treatment in front of the tear.  With regards to surgical options, he did ask about a possible sling procedure.  I clarified that if he was talking about a posterior approach with a sling of collagen matrix such as DuraGen ventrally, I did not believe that  in my hands this would be as successful because there could be no way of knowing complete apposition of the sling against the dural tear nor visualization of the exact source of the tear from a posterior approach.    If surgery something that she would wish to pursue, my recommendation in my hands would be an anterior approach with a likely C7 corpectomy for visualization and direct repair of the dural tear.  I explained that localization of the tear is  something that I would depend on Dr. Ivin Poot neuroadiology expertise, but that the surgery by nature is also partially exploratory.  I also explained that neurologic improvement could not be guaranteed as well; his symptoms over time have become more atypical from the classic CSF hypotension clinical picture, and although it doesn't appear that there is any other etiology for his symptoms, improvement after surgery cannot be certain.  I did go over the risks, benefits, operative procedure, and expected postoperative course and outcome in detail specifically regarding an anterior cervical corpectomy as well.    Due to the complicated nature of his condition, he did ask very good questions with regards to other treatment options such as conservative observation or long-term expectations.  I counseled him that at this point, the option for surgery would depend on his own personal evaluation of his current quality of life; if he feels that it is unacceptable, then surgery at this point would be something very reasonable to consider as an exploratory option if further fibrin or blood patches fail to help.    He was appreciative of the discussion and attention during this visit.  He will go home and think about his options, and continue to reach out to Dr. Pryor Curia at [facility], as well as Dr. Trinna Balloon at [place].  He knows how to contact me again if he wishes to pursue surgery with me.    Plan:  1.  He will go home and think about his options. If he does wish to pursue surgery, I would recommend a likely C7 corpectomy with direct repair of anterior dural tear.  2.  He understands the above. I answered all of his questions today. He knows how to contact my office with any other questions or concerns that may arise in the interim.      Due to the significant amount of time required for discussion of this complicated condition as well as its potential treatment options noted above, today I spent a total of 60 minutes  of face-to-face time, with greater than 50% of the time spent in counseling and coordination of care.

## 2018-05-14 ENCOUNTER — Telehealth (INDEPENDENT_AMBULATORY_CARE_PROVIDER_SITE_OTHER): Payer: Self-pay | Admitting: Cardiovascular Disease

## 2018-05-14 ENCOUNTER — Other Ambulatory Visit: Payer: Self-pay

## 2018-05-14 DIAGNOSIS — Z789 Other specified health status: Secondary | ICD-10-CM

## 2018-05-14 DIAGNOSIS — E7800 Pure hypercholesterolemia, unspecified: Secondary | ICD-10-CM

## 2018-05-14 DIAGNOSIS — E782 Mixed hyperlipidemia: Secondary | ICD-10-CM

## 2018-05-14 MED ORDER — ALIROCUMAB 150 MG/ML SC SOAJ
75.0000 mg | SUBCUTANEOUS | 11 refills | Status: DC
Start: 2018-05-14 — End: 2019-03-31
  Filled 2018-05-14: qty 1, fill #0

## 2018-05-14 NOTE — Telephone Encounter (Signed)
-----   Message from Kenney Houseman, [place] sent at 05/13/2018  5:29 PM PST -----  Regarding: New rx needed  Hi Dr Cherylann Ratel,    Patient has been receiving prauluent 75 mg sq every 2 weeks and his prescription has expired.    Would you like to renew for a year?    You can respond to this email, i'll generate and order and fax for your signature.    Please include a good fax number.    Thank you,  Bronwen Betters, PharmD  [facility]  Ext [phone]

## 2018-05-15 ENCOUNTER — Encounter (INDEPENDENT_AMBULATORY_CARE_PROVIDER_SITE_OTHER): Payer: Self-pay | Admitting: Neurological Surgery

## 2018-05-15 DIAGNOSIS — G96198 Other disorders of meninges, not elsewhere classified: Secondary | ICD-10-CM

## 2018-05-15 DIAGNOSIS — G9619 Other disorders of meninges, not elsewhere classified: Principal | ICD-10-CM

## 2018-05-20 ENCOUNTER — Other Ambulatory Visit: Payer: Self-pay

## 2018-05-21 ENCOUNTER — Ambulatory Visit: Payer: No Typology Code available for payment source | Attending: Physical Therapy | Admitting: Physical Therapy

## 2018-05-21 DIAGNOSIS — M533 Sacrococcygeal disorders, not elsewhere classified: Principal | ICD-10-CM | POA: Insufficient documentation

## 2018-05-21 DIAGNOSIS — M5416 Radiculopathy, lumbar region: Secondary | ICD-10-CM | POA: Insufficient documentation

## 2018-05-21 NOTE — Interdisciplinary (Signed)
Physical Therapy Evaluation    Referring Physician Peter Minium    Diagnosis     ICD-10-CM ICD-9-CM    1. Coccydynia M53.3 724.79    2. Lumbar radiculopathy M54.16 724.4        Preferred Language:English    Start of Service  Start of Care: 05/21/18  Onset date : chronic         Activity Restrictions  Activity Restrictions: None         Past Medical History:   Diagnosis Date   . Acne    . Back pain     secondary to herniated disc, from back injury in 2000   . Hyperhidrosis     propanolol tx   . Hyperlipidemia     LDL > 200, TG ~400 at Dx, statin intolerant.  Per pt, r/o for familial HDL   . Parasomnia associated with sexual behavior     Tx with Caprice Renshaw, has been being downtitrated      Past Surgical History:   Procedure Laterality Date   . l3/4 laminectomy  2014    @ [facility], 2014   . epidural steroid injection      x5 ESIs, most ecent 2017         Physical Therapy Evaluation     Row Name 05/21/18 1500 05/21/18 1600       Medical History    History of presenting condition  States that he will be getting a corpectomy with a titanium cage, collagen wrap, seal up the CSF leak, etc. meeting with surgeons to decide who should do it and when. has had continued coccygeal  pain, they haev identified an active leak.  thinks that he has piriformis syndrome as a result. doesn't want to exercise due to the leak.  isn't getting good sleep waking up 3x/night with sleep. pseudomenigoseal  --    Previous treatment for condition  Injection;Outpatient Occupational Therapy  --    Diagnostic Tests  X-Ray;MRI;CT  --    Diagnostic test comments   See Report  --    Onset date   --  chronic    Row Name 05/21/18 1500          Functional History    Prior Level of Function  Minimal deficits     Row Name 05/21/18 1500          Pain Assessment    Pain Asssessment Tool  Numeric Pain Rating Scale     Row Name 05/21/18 1500          Numeric Pain Rating Scale     Pain Intensity - rating at present  4     Pain Intensity - rating at  worst   8     Frequency   Intermittent     Description   Sharp;Dull;Aching     Location   buttock and down into LE following S1 / L5     Row Name 05/21/18 1500          Pain Characteristics    Exacerbating Factors Other (comments)  sitting for long periods of time     Relieving Factors - better with  movement           Assessment  Assessment : "[name]" is a pleasant 38 year old with significant medical history and lower back complications but presents with 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 He responded well to piriformis stretching and rolling to the piriformis with significant decreases in symptoms.  He has other ongoing symptoms, although, which are related to CSF issues and complications requiring surgeries. He will be speaking with multiple surgeons about his options with regards to those symptoms. He will only need to return if symptoms do not respond well to new stretches for piriformis. Strengthening currently is not an option as it could exacerbate the CSF issues. He is a good candidate and should respond well to skilled physical therapy to address the piriformis restrictions to have improved comfort during work and other ADL's   Rehab Potential: Howard Name 05/21/18 1600          Modified Oswestry Disability Questionnaire    Back Index Total Score  25     Oswestry Back Disability Questionnaire Assessment  20 to 39 - 20-39% impaired           Objective    LUMBAR OBJECTIVE EXAM  Involved Side: bilateral but left > right   Standing Observation: unremarkable   Sitting Observation: unremarkable       Range of Motion   Lumbar   Right (degrees) Left (degrees)   Flexion -25%    Extension -10%*    Rotation wnl wnl   Sidebend wnl wnl    *=Pain      Lumbar Special Tests   Right Left   Fortin Sign negative negative   Lumbar Quadrant Test negative negative   Cross Legged Straight Raise  negative negative   Straight Leg Raise negative negative   Prone  Instability Test not done not done   FABER negative negative   FADIR not done not done       Radiculopathy Testing   Right Left   Prone Knee bend not done not done   Slump not done not done   Sciatic Nerve Tension Test: Sural  positive: mod positive: mod   Sciatic Nerve Tension Test: Peroneal negative negative   Sciatic Nerve Tension Test: Tibial  positive: mild positive: mild                             -     Row Name 05/21/18 1600          Patient/Family Education    Learner(s)  Patient     Patient/family training in appropriate therapeutic interventions  Ongoing     Education Topic(s)  Exercise Program     Row Name 05/21/18 1600             Functional Assessment Tool    Oswestry Back Disability Questionnaire Assessment  20 to 39 - 20-39% impaired     Row Name 05/21/18 1600          Patient stated Goal    Patient stated goal  to see what this is and if it's just the CSF leak     Row Name 05/21/18 1600          GOAL 1 (Short Term)    Impairment  Education     Education  Patient able to return demonstrate Home Exercise Program independently to enable patient to achieve stated functional goal     No. visits  1-3     Goal status  New     Row Name 05/21/18 1600          GOAL 2 (Short Term)     Impairment   Range of Motion  Custom goal  Improve piriformis mobility with no pain during palpation to decrease pressure on sciatic nerve to allow patient to sit for 15 minutes without pain      No. visits  1-3     Goal status  New     Row Name 05/21/18 1600          Goal  (Long Term)     Goal (Long Term)   patient will be able to it for 30 minutes without radicular symptoms related to piriformis entrapment     No. visits  3-5     Goal status  New     Row Name 05/21/18 1600          Outpatient Treatment Plan    OP Amount of treatment  1 time per day     OP Treatment Frequency  1 time per month     OP Treatment Duration  4 weeks     Row Name 05/21/18 1600          Treatment Plan Discussion    Include in My Healthcare  Myself      Treatment Plan Discussion & Agreement  Patient     Patient/Family Questions  Yes - All questions asked & answered     Tanaina Name 05/21/18 1600          Focus for Next Treatment    Focus for Next Treatment  Therapeutic exercise     Row Name 05/21/18 1500          Type of Eval    Low Complexity [phone])  Completed     Row Name 05/21/18 1500          Therapeutic Procedures    Therapeutic exercise  ([id number])   Patient education;Range of motion exercises;Strengthening exercises        Total TIMED Treatment (min)   15       Therapeutic exercise    Home Exercise Program List    Stretching/ROM  . Piriformis stretch 1&2  . Piriformis foam rolling  . Lacrosse ball release at wall         Row Name 05/21/18 1600          Treatment Time     Total TIMED Treatment  (min)  15     Total Treatment Time (min)  45             Patient was educated on home exercise program including appropriate cueing and interventions.     The physical therapist of record is endorsed by evaluating physical therapist.

## 2018-05-27 ENCOUNTER — Encounter (HOSPITAL_BASED_OUTPATIENT_CLINIC_OR_DEPARTMENT_OTHER): Payer: Self-pay | Admitting: Pain Medicine

## 2018-05-27 ENCOUNTER — Other Ambulatory Visit: Payer: Self-pay

## 2018-05-28 ENCOUNTER — Encounter (HOSPITAL_BASED_OUTPATIENT_CLINIC_OR_DEPARTMENT_OTHER): Payer: Self-pay | Admitting: Pain Medicine

## 2018-05-28 ENCOUNTER — Encounter (INDEPENDENT_AMBULATORY_CARE_PROVIDER_SITE_OTHER): Payer: Self-pay | Admitting: Neurological Surgery

## 2018-05-28 DIAGNOSIS — G894 Chronic pain syndrome: Secondary | ICD-10-CM

## 2018-05-28 DIAGNOSIS — G4700 Insomnia, unspecified: Principal | ICD-10-CM

## 2018-05-28 MED ORDER — GABAPENTIN 300 MG OR CAPS
ORAL_CAPSULE | ORAL | 11 refills | Status: DC
Start: 2018-05-28 — End: 2018-07-23

## 2018-05-28 NOTE — Progress Notes (Signed)
Pt messages me on MyChart regarding pain interfering with sleep, rx sent to pharmacy for gabapentin    Encounter Diagnoses   Name Primary?   . Insomnia, unspecified type Yes   . Chronic pain syndrome        Diagnoses and all orders for this visit:    Insomnia, unspecified type  -     gabapentin (NEURONTIN) 300 MG capsule; 1-2 caps nightly as needed for pain/sleep    Chronic pain syndrome  -     gabapentin (NEURONTIN) 300 MG capsule; 1-2 caps nightly as needed for pain/sleep

## 2018-05-29 ENCOUNTER — Encounter (HOSPITAL_BASED_OUTPATIENT_CLINIC_OR_DEPARTMENT_OTHER): Payer: Self-pay | Admitting: Pain Medicine

## 2018-05-30 ENCOUNTER — Other Ambulatory Visit (INDEPENDENT_AMBULATORY_CARE_PROVIDER_SITE_OTHER): Payer: Self-pay | Admitting: Neurological Surgery

## 2018-05-30 ENCOUNTER — Encounter (INDEPENDENT_AMBULATORY_CARE_PROVIDER_SITE_OTHER): Payer: Self-pay | Admitting: Neurological Surgery

## 2018-05-30 DIAGNOSIS — G9600 Cerebrospinal fluid leak, unspecified: Secondary | ICD-10-CM

## 2018-05-30 DIAGNOSIS — G96198 Other disorders of meninges, not elsewhere classified: Secondary | ICD-10-CM | POA: Insufficient documentation

## 2018-05-30 MED ORDER — ACETAZOLAMIDE 250 MG OR TABS
250.00 mg | ORAL_TABLET | Freq: Three times a day (TID) | ORAL | 1 refills | Status: DC
Start: 2018-05-30 — End: 2019-04-30

## 2018-05-30 NOTE — Telephone Encounter (Signed)
Patient called requesting a refill ASAP, as pt states he took hid last tablet today and does not want to be without medication over the weekend.     Prescribing provider:Wheeler, Betha Loa, PA  Dr.Pannell/Dr.Khalessi    Last Visit Date:  01/06/18     Next Visit Date: None    Medication Name & Dose: acetaZOLAMIDE (DIAMOX) 250 MG tablet    Is Pharmacy in patients demographics? YES       If no: please update pharmacy in chart             If more than one pharmacy on file which pharmacy: [id number] [place], [place] [phone]    Was patient informed of 72hr turnaround? YES

## 2018-05-30 NOTE — Telephone Encounter (Signed)
Pended and routed to Keri O'Connor, NP

## 2018-06-03 ENCOUNTER — Telehealth (INDEPENDENT_AMBULATORY_CARE_PROVIDER_SITE_OTHER): Payer: Self-pay | Admitting: Neurological Surgery

## 2018-06-03 ENCOUNTER — Telehealth (HOSPITAL_BASED_OUTPATIENT_CLINIC_OR_DEPARTMENT_OTHER): Payer: Self-pay | Admitting: Pain Medicine

## 2018-06-03 DIAGNOSIS — G4701 Insomnia due to medical condition: Secondary | ICD-10-CM

## 2018-06-03 DIAGNOSIS — G8929 Other chronic pain: Secondary | ICD-10-CM

## 2018-06-03 MED ORDER — TIZANIDINE HCL 2 MG OR TABS
ORAL_TABLET | ORAL | 0 refills | Status: DC
Start: 2018-06-03 — End: 2018-07-22

## 2018-06-03 NOTE — Telephone Encounter (Signed)
Surgury date verbally confirmed with patient. Procedure check in details and instructions have been provided to patient.    Provider: Devin Going     Procedure: C7 corpectomy, C6-T11 anterior spinal fusion   Pre Op: 06/29/18 [facility]  Post Op: 07/18/18 [facility]

## 2018-06-03 NOTE — Telephone Encounter (Signed)
Patient is requesting a refill for Tizanidine 2 mg, he would like it sent to [facility] [phone] please and a call back once completed. [facility] also [facility] to our office.

## 2018-06-04 ENCOUNTER — Encounter (INDEPENDENT_AMBULATORY_CARE_PROVIDER_SITE_OTHER): Payer: Self-pay | Admitting: Neurological Surgery

## 2018-06-04 ENCOUNTER — Encounter (INDEPENDENT_AMBULATORY_CARE_PROVIDER_SITE_OTHER): Payer: Self-pay | Admitting: Internal Medicine

## 2018-06-05 NOTE — Telephone Encounter (Signed)
From: [name], PhD  To: [name], MD  Sent: 06/04/2018 12:11 PM PST  Subject: 2-Procedural Question    Hi Dr. Selinda Eon, see message below that I just sent to Dr. Acquanetta Chain. Maree Erie will be contacting your office directly probably today to set up a pre-op clearance appointment for the week of March 9th. Let me know if you have any questions!  Marjory Sneddon Dr. Devin Going, had a great appointment with Dr. Trinna Balloon at [facility]. In summary, his plan is to perform a digital subtraction myelogram (Day 1) to determine the exact location of the leak, then go in from the back and suture/muscle graft the tear as well as address any bone spurs as (Day 2). Dr. Pearline Cables at [facility] concurs with this approach and stated through messages that this does not preclude any future procedures that she or you could perform (e.g. collagen wrap) should a re-leak or another leak occur (however, I'm still waiting to speak with her by phone to hear her overall perspective). Dr. Trinna Balloon knows I have an HMO with [place] and a specific type of group plan, and offered to speak with anyone at [facility] if needed (e.g., you, Dr. Leodis Liverpool, Dr. Cherrie Gauze) or anyone with insurance (e.g., in a peer to peer capacity). Based on our correspondence, I'm guessing you also view this as something that would work, but wanted to confirm directly!    At this point, I would like to move forward with getting the procedure with Dr. Trinna Balloon at [facility]. They called me today with dates of March 20 (DSM) and 23 (surgery), and plan on faxing information to Dr. Drue Novel office to schedule a pre-op appointment the week of March 9th. I'm open to meeting with you or anyone else if you deem necessary. I'm also open to corresponding over messages or by phone. Let me know what I need to do on my end, and how I can facilitate moving forward with this.     Thank again for everything!  [name]

## 2018-06-05 NOTE — Telephone Encounter (Signed)
Routing message to MD

## 2018-06-05 NOTE — Telephone Encounter (Signed)
Routed to Keri O'Connor, NP to review and advise.

## 2018-06-05 NOTE — Telephone Encounter (Signed)
It looks like I have 1 resident Wednesday 3/15  Can patient please be scheduled that afternoon for preop?

## 2018-06-06 ENCOUNTER — Telehealth (INDEPENDENT_AMBULATORY_CARE_PROVIDER_SITE_OTHER): Payer: Self-pay | Admitting: Internal Medicine

## 2018-06-06 NOTE — Telephone Encounter (Signed)
Who is calling: Incoming call from patient  Insurance Coverage Verified: Active- in network  Reason for this call:   Received call from patient wanting to initiate peer to peer request with [employer] for his scheduled neurosurgery at [facility]. Per patient, they are nervous because they have done surgery before and it had taken a while. Patient would like to get status update to ensure everything will be good to go. Below I provided dates patient listed for deadlines on paperwork. Please advise.    Pre-op Dr. Selinda Eon: 03.11.2020  Imaging: 03.20.2020  Neurosurgery: 03.23.2020  [facility]     [employer] Providers line: [phone]    Patient would also like to know if Dr. Selinda Eon can call Dr. Daisy Lazar to touch base with him and ensure everyone is on the same page. Please advise.      Action required by office: Please contact caller    Duplicate encounter? No previous documentation found on this issue.     Best way to contact: [phone] (H)  Alternative: N/A    Inquiry has been read verbatim to this caller. Verbalizes satisfaction and confirms the above is accurate: Yes      Has been advised this message will be transmitted to office and can expect a response within the next 24-72 hours.

## 2018-06-06 NOTE — Telephone Encounter (Signed)
Patient has appt with NP on 03.11.2020.  NP will see in Media at appt what is needed for clearance.  Thank you.  Please reference MyChart Message on 02.19.2020.

## 2018-06-06 NOTE — Telephone Encounter (Signed)
Called pt and scheduled pre-op clearance with Dr Selinda Eon on 05/27/18 at 63PM.

## 2018-06-06 NOTE — Telephone Encounter (Signed)
I am not sure I am the best for a peer to peer for surgery, given I really cannot speak to the reasons why this is the optimal option at this time  Perhaps his surgical team can provide insight?

## 2018-06-06 NOTE — Telephone Encounter (Signed)
Fax Received    Date Received: 06/04/18    Date Processed: 06/06/18    From: [facility]     Document Type: Pre-op clearance request.     Outside fax imported to patient's media manager and attached to this encounter, please review.  Thank you.

## 2018-06-06 NOTE — Telephone Encounter (Signed)
Routed to MD to Advise.

## 2018-06-10 NOTE — Telephone Encounter (Addendum)
Patient called again regarding message below (see also closed encounter message 2/21).    Who is calling: patient  Insurance Coverage Verified: active [place] office visit: 04/04/2018  Next office visit: 06/25/2018 pre-op clearance    Reason for this call:    Per patient request peer to peer for pending neurosurgery at [facility] scheduled on 3/23.  Per patient been since last week with no reply.   [employer] [phone]      Attempt to contact MA, Caryl Pina unsuccessfully.  Action required by office:    Per patient request call back regarding peer-peer discussion. Please advise.    Best way to contact: [phone]  Alternative: none  Inquiry has been read verbatim to this caller. Verbalizes satisfaction and confirms the above is accurate: yes  Has been advised this message will be transmitted to office and can expect a response within the next 24-72 hours.

## 2018-06-10 NOTE — Telephone Encounter (Signed)
Called pt for clarification as he is already schedule for Pre-op with Dr Selinda Eon on 06/25/18.  Pt is asking if Dr Selinda Eon could do Peer-to-Peer and obtain Out of Network Auth to [facility] for surgery. Shared Dr Drue Novel response and advised him to request through Neurosurgery Dept which is more appropriate. Pt verbalized complete understanding. He will contact Dr Carleene Cooper office. Matter resolved for now.

## 2018-06-11 ENCOUNTER — Encounter (HOSPITAL_BASED_OUTPATIENT_CLINIC_OR_DEPARTMENT_OTHER): Payer: Self-pay | Admitting: Pain Medicine

## 2018-06-11 ENCOUNTER — Ambulatory Visit: Payer: No Typology Code available for payment source | Attending: Pain Medicine | Admitting: Pain Medicine

## 2018-06-11 ENCOUNTER — Other Ambulatory Visit: Payer: Self-pay

## 2018-06-11 VITALS — BP 113/71 | HR 87 | Temp 98.7°F

## 2018-06-11 DIAGNOSIS — G9389 Other specified disorders of brain: Secondary | ICD-10-CM | POA: Insufficient documentation

## 2018-06-11 DIAGNOSIS — M5417 Radiculopathy, lumbosacral region: Secondary | ICD-10-CM | POA: Insufficient documentation

## 2018-06-11 DIAGNOSIS — G96810 Intracranial hypotension, unspecified: Secondary | ICD-10-CM

## 2018-06-11 DIAGNOSIS — J634 Siderosis: Secondary | ICD-10-CM | POA: Insufficient documentation

## 2018-06-11 DIAGNOSIS — M961 Postlaminectomy syndrome, not elsewhere classified: Secondary | ICD-10-CM | POA: Insufficient documentation

## 2018-06-11 DIAGNOSIS — G894 Chronic pain syndrome: Secondary | ICD-10-CM | POA: Insufficient documentation

## 2018-06-11 DIAGNOSIS — G4701 Insomnia due to medical condition: Secondary | ICD-10-CM | POA: Insufficient documentation

## 2018-06-11 MED ORDER — MELOXICAM 15 MG OR TABS
15.0000 mg | ORAL_TABLET | Freq: Every evening | ORAL | 0 refills | Status: DC
Start: 2018-06-11 — End: 2019-05-14

## 2018-06-11 NOTE — Patient Instructions (Addendum)
1) Tonight: tizanidine 2mg  x 2 tabs and gabapentin 300mg  x 2 capsules  2) Tomorrow night, if needed: tizanidine 2mg  x 2 tabs and gabapentin 300mg  x 2 capsules and meloxicam 7.5-15mg   3) After that, if needed: Trial adding medical marijuana    Patient to Contact Dr. Linus Mako for a Medical Marijuana Consultation    Erik Burke, ND   Assistant Adjunct Professor   Department of Anesthesiology   Voluntary Clinical Instructor   Department of Family Medicine and [place]: [place]   [address]   [place], [place] [address]   Phone: [phone]   Fax: [phone]        Please be advised that the visit and medication is NOT covered by your insurance.   Contact Dr. Cindra Eves office for payment information.         Dosing Guidelines for Cannabis to Treat Pain    The primary active ingredient in Cannabis is delta-9THC which has long been studied as an analgesic compound.  It has been shown to be effective in pain caused by nervous system injury.    The most common model for dosing of cannabis and associated products is a self-titration model.  This is where the initial dose is very low and the dose is only increased to tolerance to side effects and dose escalation is stopped when achieving pain relief.    Some studies have shown that increasing dose of THC too high may actually worsen, rather than improve pain. There are no human trials of CBD for treating pain, as pharmacologists have never reported CBD to be analgesic.  CBD's main action may be by providing anti-anxiety benefit, but has also been shown to decrease the side-effects of THC.    Start by using low dose administration   . At low dose, there is less chance of side effects that will impair any function.  . The lowest effective dose is very patient specific, and when using a low dose there is reduced chance of building tolerance.    Inhaled cannabis can provide very rapid effects (within 30 minutes) and short duration  (around 2 hours).  Oral administration has a much slower and variable onset (between 2-4 hours) but will provide more prolonged relief (between 4-8 hours depending on individual metabolism).    Vaporization is the best method for inhalation which avoids the tar and irritants when the cannabis is combusted into smoke.  It is recommended to vaporize organically grown cannabis flower and avoid inhaling cannabis oil concentrates with vape pens.  The extraction process for the oils may have unknown contaminates.  Inhalation is not recommended if you are immunocompromised as the cannabis flower may have fungus contamination.  Check with your doctor about whether it is safe for you to inhale cannabis.    Many licensed dispensaries have a variety of formulations with different ratios of THC to CBD. The dose needed will vary from person to person and the best approach is a self-titration model.  This entails starting with a low dose of THC, and then slowly increasing to your tolerance. Look for cannabis chemotypes for inhalation that are "CBD dominant" meaning that the [facility] content is 5% or less or start with a low oral dose of about 2 mg.      Any liquid preparation you purchase should be clearly labeled providing a specific milligram (amount of drug) per milliliter (amount of liquid) potency (mg/ml).  In addition, ask to  see the dropper in the bottle as is should have markings so you know how much of a milliliter you are taking.  This allows for accurate dosing using the marked eyedropper or you can also use a 1 milliliter syringe to draw up your dose (like an insulin syringe without a needle).  Dosing by number of drops is not accurate.  The cannabis oral form should be taken 2-3 times daily or as needed and to-tolerance. It is suggested to initially wait 6 hours between doses, until you know how long the effect lasts for you.  This is highly variable across individuals.                               The content of CBD and  THC is important. For most patients, a CBD:THC ratio of approximately 1:1 is broadly effective and well tolerated.  If you find you are very sensitive to the [facility] (have dizziness, anxiety, nausea or cognitive effects), then shop for a 20:1 or 30:1 ratio of CBD to THC. This will allow for starting your THC dose at less than 1 milligram and work up slowly. If you tolerate the high CBD to THC ratios during the day, you may find using the 1:1 ratio at night will improve your sleep.    Use caution when receiving advice from the retail sales people in cannabis dispensaries. Remember that they are there for "sales" and have a conflict of interest when serving patients.  Be educated about what to purchase and don't allow them to sell you products that you are not seeking.

## 2018-06-11 NOTE — Progress Notes (Signed)
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 He was last seen in clinic on 03/05/18.     Worst pain continues to be in the buttocks, with secondary pain behind the eyes. His buttock pain is bilateral with intermittent shooting pain down BLE. After last visit started gabapentin. He's only been taking it for 3 nights, taking 600mg  qHS. Feels it may be somewhat helpful for his sleep, but hasn't noticed much improvement in pain yet. He continues to have some difficulty falling asleep, but he's mostly bothered by frequent nighttime waking due to pain with difficulty falling back asleep. Unfotunately he had no improvement at all with last injection, which was ganglion impar block and sacrococcygeal ligament injection.     He is planning on neurosurgery at [facility] coming up with plan for muscle graft vs suturing of his ventral CSF leak.    Current Description of Symptoms:   On the pain diagram today the patient shades in the areas of their buttock, bilateral lower extremities both anteriorly and posteriorly, and left groin region.  Since the last visit in Pain Clinic patient reports 0% improvement in their pain from pain procedures.  Patient reports 10% improvement in their pain with use of their current pain medications.  The patient has not seen a physical therapist to treat the current problem.   Over the last 12 months, they have done 0 sessions and over the last 6 months they have done 0 sessions.   The patient is doing a home exercise program.  They describe their pain as pressure. Patient states their pain is associated with none. This  pain has made it hard for the patient to sleep.    The patient denies saddle anesthesia or bowel or bladder incontinence.    The patient stated their pain today is 7/10.   Over the past week the patient's pain has been at its worst 9/10, at best 2/10 and averages 7/10.   During the past week, it has interfered with enjoyment of life 7/10 and general activity 4/10.    Pain Treatments  Previous pain procedures, dates, and response include:   - Ganglion impar block and sacrococcygeal ligament injection 03/28/18 - no relief    - Caudal ESI 12/02/17: relief for ~2 days  - Previous caudal ESI's with relief   - Previous facet injections with most significant long-term relief    Patient is currently using the following pain medications:   - None     Past Medical History:   Diagnosis Date   . Acne    . Back pain     secondary to herniated disc, from back injury in 2000   . Hyperhidrosis     propanolol tx   . Hyperlipidemia     LDL > 200, TG ~400 at Dx, statin intolerant.  Per pt, r/o for familial HDL   . Parasomnia associated with sexual behavior     Tx with Caprice Renshaw, has been being downtitrated     Past Surgical History:   Procedure Laterality Date   . l3/4 laminectomy  2014    @ [place], 2014   .  epidural steroid injection      x5 ESIs, most ecent 2017     Current Outpatient Medications   Medication Sig Dispense Refill   . acetaZOLAMIDE (DIAMOX) 250 MG tablet Take 1 tablet (250 mg) by mouth 3 times daily. 90 tablet 1   . Alirocumab (PRALUENT) 150 MG/ML SOAJ Inject 75 mg under the skin every 14 days. 1 Box of pens 11   . clonazePAM (KLONOPIN) 0.5 MG tablet Take 1 tablet (0.5 mg) by mouth at bedtime. 30 tablet 0   . desonide (DESOWEN) 0.05 % cream Apply 1 Application topically 2 times daily. apply to affected areas 15 g 1   . gabapentin (NEURONTIN) 300 MG capsule 1-2 caps nightly as needed for pain/sleep 60 capsule 11   . oseltamivir (TAMIFLU) 75 MG capsule Take 1 capsule (75 mg) by mouth daily. 10 capsule 0   . propranolol  (INDERAL LA) 60 MG CP24 Take 1 capsule (60 mg) by mouth daily. 90 each 3   . tizanidine (ZANAFLEX) 2 MG tablet 1-2 tabs nightly as needed for pain/sleep 60 tablet 0   . tretinoin (RETIN-A) 0.025 % cream Use a pea sized amount on the entire face nightly.  At first, you may use every other night to avoid dryness and irritation, and gradually increase use to nightly as tolerated 45 g 3     No current facility-administered medications for this visit.      Allergies   Allergen Reactions   . Atorvastatin Other     Debilitating myalgias   . Pravastatin Other     Debilitating myalgias   . Rosuvastatin Other     Debilitating myalgias   . Zetia [Ezetimibe] Other     Debilitating myalgias     Social History     Socioeconomic History   . Marital status: Married     Spouse name: Not on file   . Number of children: Not on file   . Years of education: Not on file   . Highest education level: Not on file   Occupational History   . Not on file   Social Needs   . Financial resource strain: Not on file   . Food insecurity:     Worry: Not on file     Inability: Not on file   . Transportation needs:     Medical: Not on file     Non-medical: Not on file   Tobacco Use   . Smoking status: Never Smoker   . Smokeless tobacco: Never Used   Substance and Sexual Activity   . Alcohol use: Yes     Comment: 1-3 drinks infrequently   . Drug use: Not on file   . Sexual activity: Not on file   Lifestyle   . Physical activity:     Days per week: Not on file     Minutes per session: Not on file   . Stress: Not on file   Relationships   . Social connections:     Talks on phone: Not on file     Gets together: Not on file     Attends religious service: Not on file     Active member of club or organization: Not on file     Attends meetings of clubs or organizations: Not on file     Relationship status: Not on file   . Intimate partner violence:     Fear of current or ex partner: Not on file     Emotionally  abused: Not on file     Physically abused: Not on  file     Forced sexual activity: Not on file   Other Topics Concern   . Not on file   Social History Narrative    PhD in Psychology, works in [employer], [place] [employer]     Family History   Problem Relation Name Age of Onset   . Cancer Mother          cervical   . No Known Problems Father     . Cancer Maternal Grandmother          ovarian   . No Known Cancer Maternal Grandfather     . Heart Disease Maternal Grandfather     . Cancer Paternal Grandmother          pancreatic   . Cancer Paternal Grandfather          mesothelioma   . Cancer Brother  17        osteosarcoma     ROS: See scanned document under Media    Physical Exam:   Vitals: BP 113/71 (BP Location: Left arm, BP Patient Position: Sitting, BP cuff size: Large)   Pulse 87   Temp 98.7 F (37.1 C) (Oral)   Constitutional: Vital signs listed above. Well-developed, well-nourished, and no distress  Psych: alert, awake and oriented, oriented. Speech is clear/ normal. Affect is euthymic.  Eyes: Pupils equal, not pinpoint.  ENT: Oropharynx clear and moist without erythema. Gums pink, good dentition.  Musculoskeletal: No occipital, paracervical, or shoulder muscle tenderness . No significant buttock tenderness to palpation  Neurological:  Mental Status; Awake, alert, oriented  Cranial Nerves: II-XII grossly intact  Motor: Normal bulk and tone.   Coordination: No gross axial or appendicular ataxia.  Sensory: Intact to light touch  Gait: Pt is able to raise from a seated position without difficulty. Gait  is not antalgic and the patient ambulates without assistance.     Labs and Imaging:   MRI Lumbar Spine, 07/15/17  IMPRESSION:  1. Interval postoperative changes compatible with discectomy and posterior decompression at L2-L3 with marked improvement of the prior spinal canal stenosis at this level. No focal collection or specific evidence of leak is identified.  2. Interval subtle smooth linear signal abnormality/T2 hypointensity involving the caudal aspect of the thecal  sac with subtle enhancement, which may be associated with siderosis as well subtle reactive change, given prior intervention and concurrent MRI brain findings.  3. Degenerative changes of the lumbar spine at L3-L4 and L4-L5 with up to moderate spinal canal stenosis at L3-L4 and moderate bilateral neural foraminal stenosis at L3-L4 and L4-L5.    Assessment:  Encounter Diagnoses   Name Primary?   . 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 Chronic pain syndrome    . Lumbosacral radiculopathy    . Post laminectomy syndrome    . Intracranial hypotension    . 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 MRI l-spine is significant for moderate  bilateral neural foraminal stenosis at L4-L5, but he had no response to caudal epidural steroid inejction. His coccygeal/perineal pain may be due to interval subtle smooth linear signal abnormality/T2 hypointensity involving the caudal aspect of the thecal sac with subtle enhancement, which may be associated with siderosis as well subtle reactive change, given prior intervention and concurrent MRI brain findings. This may benefit from SCS, with caveat that infection risk may be increased if continues to have blood patches. Notably, he is planning on pursuing neurosurgical intervention for his cervical CSF leak, involving muscle grafting or suturing of the dura to patch the leak. He is here today primarily to discuss ongoing problems with sleep  related to pain, having been on trial of gabapentin for the past 2-3 days.         PLAN:   Medications  - Continue gabapentin 600mg  qHS   - Continue tizanidine 2-4mg  qHS   - D/c ibuprofen, trial nightly meloxicam    - Discussed option for medical cannabis, given information for Dr. Linus Mako     Interventional procedures  - If ongoing radicular pain 2-3 months following neurosurgical intervention, can consider L4-5 ILESI  - In future may be candidate for SCS for his post-laminectomy syndrome with ongoing radiculopathy      Other  - F/u neurosurgical intervention as planned     Follow-up: Following neurosurgical intervention     Boone Master, DO  Pain Medicine Fellow

## 2018-06-13 ENCOUNTER — Encounter (INDEPENDENT_AMBULATORY_CARE_PROVIDER_SITE_OTHER): Payer: Self-pay | Admitting: Neurological Surgery

## 2018-06-16 ENCOUNTER — Encounter (INDEPENDENT_AMBULATORY_CARE_PROVIDER_SITE_OTHER): Payer: Self-pay | Admitting: Neurological Surgery

## 2018-06-16 DIAGNOSIS — G9600 Cerebrospinal fluid leak, unspecified: Secondary | ICD-10-CM

## 2018-06-16 NOTE — Telephone Encounter (Signed)
Erik Burke from [facility] center is calling asking to speak with AA or nurse of Dr. Devin Going. Regarding a referral to them for possible surgery. Please contact Erik Burke at [phone]

## 2018-06-16 NOTE — Progress Notes (Signed)
Attending Note:     Subjective:   I reviewed the history.   Patient interviewed and examined.   Chief Complaint/History of present illness (HPI): This is a 38 year old male who has a past medical history of Acne, Back pain, Hyperhidrosis, Hyperlipidemia, and Parasomnia associated with sexual behavior. he is here today with chronic pain and insomnia here for follow-up.  ROS: See scanned document under Media  Past Medical, Family, Social History: As per the fellow physician note.   Objective:   I have examined the patient and I concur with the fellow physician exam as documented.   Assessment and plan reviewed with the fellow physician.     I agree with the fellow history, exam, assessment and plan.    Encounter Diagnoses   Name Primary?   . 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 Chronic pain syndrome    . Lumbosacral radiculopathy    . Post laminectomy syndrome    . Intracranial hypotension    . 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    Diagnoses and all orders for this visit:    Insomnia due to medical condition  -     meloxicam (MOBIC) 15 MG tablet; Take 1 tablet (15 mg) by mouth at bedtime.    Chronic pain syndrome  -     meloxicam (MOBIC) 15 MG tablet; Take 1 tablet (15 mg) by mouth at bedtime.    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  See the fellow physician note for further details.    Peter Minium, MD   Pain Neurologist  Assistant [facility] for [facility]  Department of Anesthesiology  [place]    More than 25 min was spent with the patient today, of which more than 50% was spent in counseling regarding the diagnosis, prognosis, further diagnostic and therapeutic options and coordinating this care.  There were no barriers to learning.

## 2018-06-16 NOTE — Telephone Encounter (Signed)
Routed to Dr. Pannell and Keri O'Connor, NP to review and advise.

## 2018-06-16 NOTE — Telephone Encounter (Signed)
Patient calling in regarding previous message. He would like the requested services to be expedited. Patient stated he has surgery scheduled at [facility] on 07/04/18. He is requesting a call back from a clinical nurse to discuss matter. Please advise.     Ph: [phone]

## 2018-06-16 NOTE — Telephone Encounter (Addendum)
Routed to care team to review and advise.     No orders in place for myelogram.

## 2018-06-17 ENCOUNTER — Other Ambulatory Visit (INDEPENDENT_AMBULATORY_CARE_PROVIDER_SITE_OTHER): Payer: Self-pay | Admitting: Neurological Surgery

## 2018-06-17 ENCOUNTER — Telehealth (INDEPENDENT_AMBULATORY_CARE_PROVIDER_SITE_OTHER): Payer: Self-pay | Admitting: Neurological Surgery

## 2018-06-17 ENCOUNTER — Encounter (INDEPENDENT_AMBULATORY_CARE_PROVIDER_SITE_OTHER): Payer: Self-pay

## 2018-06-17 DIAGNOSIS — G9600 Cerebrospinal fluid leak, unspecified: Secondary | ICD-10-CM

## 2018-06-17 NOTE — Telephone Encounter (Signed)
Patient is calling about scheduling Myelogram  Looking to schedule before the 20 th to get [facility]  results to [facility]  in [place].   Erik Burke is location patient needs to have it done as per patient only location that has equipment.   Please advise with patient phone # [phone]. Ok to leave voicemail.

## 2018-06-17 NOTE — Telephone Encounter (Signed)
As noted in the mychart message from the patient, there was no orders in yet. Working on scheduling.

## 2018-06-17 NOTE — Telephone Encounter (Signed)
Confirmed procedure date with pt. Provided check in details verbally.      Provider: Dr.Pannell     Procedure: IR Lumbar Puncture Diagnostic Theraputic   Date: 07/02/18  Check-in Time/Start time:9:00/9:30   Location:[place]   Sedation: Local     Labs:No   Meds:No     Anesthesia Consult?:No   Post op?:No

## 2018-06-17 NOTE — Telephone Encounter (Signed)
Who is calling: Incoming call from patient    Reason for this call: Patient calling to schedule a "DSM Monogram" Per patient spoke to Dr. Leodis Liverpool and was told to call to schedule. This agent reached out to [place] and per Minerva will call patient to schedule a CT Myelogram.     Please advise    Provider: Dr. Leodis Liverpool    Action required by office: Please expedite request and Please contact caller    Best way to contact:  [phone]    Caller has been advised this message will be transmitted to office and can expect a response within 72 business hours

## 2018-06-18 ENCOUNTER — Other Ambulatory Visit (HOSPITAL_BASED_OUTPATIENT_CLINIC_OR_DEPARTMENT_OTHER): Payer: Self-pay | Admitting: Neurological Surgery

## 2018-06-18 ENCOUNTER — Other Ambulatory Visit: Payer: Self-pay

## 2018-06-19 ENCOUNTER — Encounter (INDEPENDENT_AMBULATORY_CARE_PROVIDER_SITE_OTHER): Payer: No Typology Code available for payment source | Admitting: Primary Care

## 2018-06-19 ENCOUNTER — Encounter (INDEPENDENT_AMBULATORY_CARE_PROVIDER_SITE_OTHER): Payer: Self-pay | Admitting: Hospital

## 2018-06-19 NOTE — Telephone Encounter (Signed)
MD updated that procedure would be performed under GA.     Will update patient with GA instructions.

## 2018-06-22 ENCOUNTER — Other Ambulatory Visit: Payer: Self-pay

## 2018-06-23 ENCOUNTER — Telehealth (INDEPENDENT_AMBULATORY_CARE_PROVIDER_SITE_OTHER): Payer: Self-pay | Admitting: Neurological Surgery

## 2018-06-23 ENCOUNTER — Encounter (INDEPENDENT_AMBULATORY_CARE_PROVIDER_SITE_OTHER): Payer: Self-pay | Admitting: Nurse Practitioner

## 2018-06-23 ENCOUNTER — Other Ambulatory Visit (INDEPENDENT_AMBULATORY_CARE_PROVIDER_SITE_OTHER): Payer: Self-pay | Admitting: Internal Medicine

## 2018-06-23 DIAGNOSIS — R61 Generalized hyperhidrosis: Principal | ICD-10-CM

## 2018-06-23 NOTE — Telephone Encounter (Signed)
Spoke to patient who is upset because he scheduled the DSM at [place] with Dr. Leodis Liverpool on 03/18. It was also ordered by surgeon at [facility] and also scheduled it there on 03/20. But seeing as the order was a duplicate it was subsequently closed.     Patient called back shortly after we ended the call to discuss the message he had sent via mychart directed to Shelbie Hutching, NP.       Will discuss plan with Shelbie Hutching, NP

## 2018-06-23 NOTE — Telephone Encounter (Signed)
Pt is calling requesting to speak with NP Keri regarding insurance PA for his surgery. He is kindly requesting a call back ASAP. Provided no additional information.     Please advise    P) [phone]  Ok to leave VM

## 2018-06-24 NOTE — Telephone Encounter (Signed)
See MyChart encounter

## 2018-06-24 NOTE — Telephone Encounter (Signed)
Message was routed to Shelbie Hutching, NP

## 2018-06-25 ENCOUNTER — Other Ambulatory Visit: Payer: No Typology Code available for payment source | Attending: Internal Medicine

## 2018-06-25 ENCOUNTER — Encounter (INDEPENDENT_AMBULATORY_CARE_PROVIDER_SITE_OTHER): Payer: Self-pay | Admitting: Internal Medicine

## 2018-06-25 ENCOUNTER — Ambulatory Visit (INDEPENDENT_AMBULATORY_CARE_PROVIDER_SITE_OTHER): Payer: No Typology Code available for payment source | Admitting: Internal Medicine

## 2018-06-25 ENCOUNTER — Encounter (INDEPENDENT_AMBULATORY_CARE_PROVIDER_SITE_OTHER): Payer: No Typology Code available for payment source | Admitting: Nurse Practitioner

## 2018-06-25 VITALS — BP 122/68 | HR 74 | Temp 97.8°F | Resp 16 | Ht 67.0 in | Wt 165.0 lb

## 2018-06-25 DIAGNOSIS — G9600 Cerebrospinal fluid leak, unspecified: Secondary | ICD-10-CM

## 2018-06-25 DIAGNOSIS — Z01818 Encounter for other preprocedural examination: Principal | ICD-10-CM

## 2018-06-25 DIAGNOSIS — Z1331 Encounter for screening for depression: Secondary | ICD-10-CM

## 2018-06-25 DIAGNOSIS — G96198 Other disorders of meninges, not elsewhere classified: Secondary | ICD-10-CM

## 2018-06-25 DIAGNOSIS — M533 Sacrococcygeal disorders, not elsewhere classified: Secondary | ICD-10-CM

## 2018-06-25 DIAGNOSIS — G9611 Dural tear: Secondary | ICD-10-CM

## 2018-06-25 DIAGNOSIS — Z1339 Encounter for screening examination for other mental health and behavioral disorders: Secondary | ICD-10-CM

## 2018-06-25 DIAGNOSIS — Z1389 Encounter for screening for other disorder: Secondary | ICD-10-CM

## 2018-06-25 DIAGNOSIS — G9619 Other disorders of meninges, not elsewhere classified: Secondary | ICD-10-CM | POA: Insufficient documentation

## 2018-06-25 LAB — URINALYSIS WITH CULTURE REFLEX, WHEN INDICATED
Bilirubin: NEGATIVE
Blood: NEGATIVE
Glucose: NEGATIVE
Ketones: NEGATIVE
Leuk Esterase: NEGATIVE
Nitrite: NEGATIVE
Protein: NEGATIVE
Specific Gravity: 1.009 (ref 1.002–1.030)
Urobilinogen: NEGATIVE
pH: 7 (ref 5.0–8.0)

## 2018-06-25 LAB — CBC WITH DIFF, BLOOD
ANC-Automated: 2.5 10*3/uL (ref 1.6–7.0)
Abs Basophils: 0 10*3/uL (ref <0.1)
Abs Eosinophils: 0.2 10*3/uL (ref 0.1–0.5)
Abs Lymphs: 2.1 10*3/uL (ref 0.8–3.1)
Abs Monos: 0.7 10*3/uL (ref 0.2–0.8)
Basophils: 1 %
Eosinophils: 4 %
Hct: 43.3 % (ref 40.0–50.0)
Hgb: 14.6 gm/dL (ref 13.7–17.5)
Lymphocytes: 38 %
MCH: 30.8 pg (ref 26.0–32.0)
MCHC: 33.7 g/dL (ref 32.0–36.0)
MCV: 91.4 um3 (ref 79.0–95.0)
MPV: 9.6 fL (ref 9.4–12.4)
Monocytes: 12 %
Plt Count: 256 10*3/uL (ref 140–370)
RBC: 4.74 10*6/uL (ref 4.60–6.10)
RDW: 12.3 % (ref 12.0–14.0)
Segs: 46 %
WBC: 5.5 10*3/uL (ref 4.0–10.0)

## 2018-06-25 LAB — COMPREHENSIVE METABOLIC PANEL, BLOOD
ALT (SGPT): 18 U/L (ref 0–41)
AST (SGOT): 19 U/L (ref 0–40)
Albumin: 4.6 g/dL (ref 3.5–5.2)
Alkaline Phos: 78 U/L (ref 40–129)
Anion Gap: 12 mmol/L (ref 7–15)
BUN: 22 mg/dL — ABNORMAL HIGH (ref 6–20)
Bicarbonate: 21 mmol/L — ABNORMAL LOW (ref 22–29)
Bilirubin, Tot: 0.39 mg/dL (ref <1.2)
Calcium: 9.4 mg/dL (ref 8.5–10.6)
Chloride: 108 mmol/L — ABNORMAL HIGH (ref 98–107)
Creatinine: 1.45 mg/dL — ABNORMAL HIGH (ref 0.67–1.17)
GFR: 55 mL/min
Glucose: 80 mg/dL (ref 70–99)
Potassium: 3.6 mmol/L (ref 3.5–5.1)
Sodium: 141 mmol/L (ref 136–145)
Total Protein: 6.9 g/dL (ref 6.0–8.0)

## 2018-06-25 LAB — PROTHROMBIN TIME, BLOOD
INR: 1
PT,Patient: 10.8 s (ref 9.7–12.5)

## 2018-06-25 LAB — APTT, BLOOD: PTT: 34 s (ref 25–34)

## 2018-06-25 NOTE — Telephone Encounter (Signed)
From: [name], PhD  To: [name], MD  Sent: 06/25/2018 3:22 PM PDT  Subject: 20-Other    Pre op clearance forms attached, thanks!  [name]

## 2018-06-25 NOTE — Telephone Encounter (Signed)
Patient was in clinic when this was sent.  MD took a look at it and per MD;  Nothing different.  No additional testing/labs for pre op needed.

## 2018-06-25 NOTE — Progress Notes (Signed)
Primary Care    History and Physical/Routine Follow Up Appointment: PRIMARY CARE and Preoperative Evaluation    Erik Limbo, PhD is a 38 year old year old male with a who comes in for routine assessment and Preoperative Evaluation.    The patient has 38YO man with a history of HLD, parasomnia, hyperhidrosis, and chronic coccyx/sacral pain w/ radiationpresents for a preoperative History of Physical.    He has a notably longstanding history of chronic pain, and has undergone numerous invasive procedures and surgeries at numerous organizations including [facility] and here at [facility].  Despite some intermittent improvement, he has continued to have severe pain at times which limits his ability to function to his best ability.    Because of progression of his pain he has had consultation with both pain management and ortho/neurosurgery here at [facility], and ultimately to [facility].  There, he has decided to have a digital subtraction myelogram to help determine the degree of his CSF leak.  After, depending on the tear size and bone spur, there will be a thoracic laminectomy.      The patient is planned to undergo:  digital subtraction myelogram    With Dr. Orvilla Fus on March 20th, 2020.  Which is a: moderate risk procedure.  Specifically he is planning a posterior intradural approach     This patient has no notable cardiac risk factors or history, IDDM no history of CVA or CKD.    His functional status includes: Limited exercise however he has no issues climbing stairs (easily 2 flights) without issues.  Walks for over 1 hour.    Re: HLD he has been maintained on Praulent given statin ADEs.    Tizanidine - intermittently  Propranolol daily.  Gabapentin - continues  Clonazapam - not using  Diamox - TID - not sure if it helps    Review of Systems:  Review of Systems   Constitutional: Negative for chills, fever and malaise/fatigue.   Respiratory: Negative for cough and shortness of breath.    Cardiovascular:  Negative for chest pain and palpitations.   Gastrointestinal: Negative for abdominal pain, nausea and vomiting.   Skin: Negative for rash.   Neurological: Negative for dizziness and headaches.       Past Medical History:  Patient Active Problem List   Diagnosis   . Hyperlipidemia   . Back pain   . Parasomnia   . Acne, unspecified acne type   . Hyperhidrosis   . Dural tear   . Postlaminectomy syndrome of lumbar region   . Coccydynia   . Eyelid lesion   . Post laminectomy syndrome   . Lumbar radiculopathy, chronic   . Sacral radiculopathy   . Intracranial hypotension   . Sphenopalatine ganglion neuralgia   . CSF leak   . Coccygeal pain   . Lumbar radiculopathy   . Pseudomeningocele       Medications were reviewed and updated on the Active Medication List   Current Outpatient Medications on File Prior to Visit   Medication Sig Dispense Refill   . acetaZOLAMIDE (DIAMOX) 250 MG tablet Take 1 tablet (250 mg) by mouth 3 times daily. 90 tablet 1   . Alirocumab (PRALUENT) 150 MG/ML SOAJ Inject 75 mg under the skin every 14 days. 1 Box of pens 11   . clonazePAM (KLONOPIN) 0.5 MG tablet Take 1 tablet (0.5 mg) by mouth at bedtime. 30 tablet 0   . desonide (DESOWEN) 0.05 % cream Apply 1 Application topically 2 times  daily. apply to affected areas 15 g 1   . gabapentin (NEURONTIN) 300 MG capsule 1-2 caps nightly as needed for pain/sleep 60 capsule 11   . meloxicam (MOBIC) 15 MG tablet Take 1 tablet (15 mg) by mouth at bedtime. 30 tablet 0   . [DISCONTINUED] oseltamivir (TAMIFLU) 75 MG capsule Take 1 capsule (75 mg) by mouth daily. 10 capsule 0   . propranolol (INDERAL LA) 60 MG CP24 Take 1 capsule (60 mg) by mouth daily. 90 capsule 3   . [DISCONTINUED] propranolol (INDERAL LA) 60 MG CP24 Take 1 capsule (60 mg) by mouth daily. 90 each 3   . tizanidine (ZANAFLEX) 2 MG tablet 1-2 tabs nightly as needed for pain/sleep 60 tablet 0   . tretinoin (RETIN-A) 0.025 % cream Use a pea sized amount on the entire face nightly.  At first, you  may use every other night to avoid dryness and irritation, and gradually increase use to nightly as tolerated 45 g 3     No current facility-administered medications on file prior to visit.          Medication Adherence:  As noted in HPI - no missed doses for assigned regular chronic meds - some are not taken daily.    Psychosocial:  Social History     Socioeconomic History   . Marital status: Married     Spouse name: Not on file   . Number of children: Not on file   . Years of education: Not on file   . Highest education level: Not on file   Occupational History   . Not on file   Social Needs   . Financial resource strain: Not on file   . Food insecurity:     Worry: Not on file     Inability: Not on file   . Transportation needs:     Medical: Not on file     Non-medical: Not on file   Tobacco Use   . Smoking status: Never Smoker   . Smokeless tobacco: Never Used   Substance and Sexual Activity   . Alcohol use: Yes     Comment: 1-3 drinks infrequently   . Drug use: Not on file   . Sexual activity: Not on file   Lifestyle   . Physical activity:     Days per week: Not on file     Minutes per session: Not on file   . Stress: Not on file   Relationships   . Social connections:     Talks on phone: Not on file     Gets together: Not on file     Attends religious service: Not on file     Active member of club or organization: Not on file     Attends meetings of clubs or organizations: Not on file     Relationship status: Not on file   . Intimate partner violence:     Fear of current or ex partner: Not on file     Emotionally abused: Not on file     Physically abused: Not on file     Forced sexual activity: Not on file   Other Topics Concern   . Not on file   Social History Narrative    PhD in Psychology, works in [employer]         Allergies   Allergen Reactions   . Atorvastatin Other     Debilitating myalgias   . Pravastatin Other  Debilitating myalgias   . Rosuvastatin Other     Debilitating myalgias   .  Zetia [Ezetimibe] Other     Debilitating myalgias       The Family History:  Family History   Problem Relation Name Age of Onset   . Cancer Mother          cervical   . No Known Problems Father     . Cancer Maternal Grandmother          ovarian   . No Known Cancer Maternal Grandfather     . Heart Disease Maternal Grandfather     . Cancer Paternal Grandmother          pancreatic   . Cancer Paternal Grandfather          mesothelioma   . Cancer Brother  17        osteosarcoma       Physical Exam:  BP 122/68 (BP Location: Left arm, BP Patient Position: Sitting, BP cuff size: Large)   Pulse 74   Temp 97.8 F (36.6 C) (Oral)   Resp 16   Ht '5\' 7"'$  (1.702 m)   Wt 74.8 kg (165 lb)   SpO2 99%   BMI 25.84 kg/m   Body mass index is 25.84 kg/m.     Physical Exam   Constitutional: He is well-developed, well-nourished, and in no distress. No distress.   HENT:   Head: Normocephalic and atraumatic.   Right Ear: Tympanic membrane and ear canal normal.   Left Ear: Tympanic membrane and ear canal normal.   Mouth/Throat: No oropharyngeal exudate.   Eyes: Conjunctivae are normal. No scleral icterus.   Neck: Normal range of motion. No tracheal deviation present.   Cardiovascular: Normal rate and normal heart sounds. Exam reveals no gallop.   No murmur heard.  Pulmonary/Chest: Effort normal and breath sounds normal. He has no wheezes. He has no rales. He exhibits no tenderness.   Abdominal: Soft. Bowel sounds are normal. There is no tenderness. There is no rebound and no guarding. Musculoskeletal: Normal range of motion.         General: No tenderness or edema.     Lymphadenopathy:     He has no cervical adenopathy.   Neurological: He is alert.   Skin: Skin is warm and dry. He is not diaphoretic.   Psychiatric: Affect normal.   Vitals reviewed.      Studies  Lab Results   Component Value Date    BUN 22 (H) 06/25/2018    CREAT 1.45 (H) 06/25/2018    CL 108 (H) 06/25/2018    NA 141 06/25/2018    K 3.6 06/25/2018    Festus 9.4 06/25/2018     TBILI 0.39 06/25/2018    ALB 4.6 06/25/2018    TP 6.9 06/25/2018    AST 19 06/25/2018    ALK 78 06/25/2018    BICARB 21 (L) 06/25/2018    ALT 18 06/25/2018    GLU 80 06/25/2018     No results found for: A1C  Lab Results   Component Value Date    AST 19 06/25/2018    ALT 18 06/25/2018    ALK 78 06/25/2018    TP 6.9 06/25/2018    ALB 4.6 06/25/2018    TBILI 0.39 06/25/2018     Lab Results   Component Value Date    CHOL 163 07/15/2017    HDL 54 07/15/2017    LDLCALC 88 07/15/2017  TRIG 105 07/15/2017     No results found for: GFRAA  Lab Results   Component Value Date    WBC 5.5 06/25/2018    RBC 4.74 06/25/2018    HGB 14.6 06/25/2018    HCT 43.3 06/25/2018    MCV 91.4 06/25/2018    RDW 12.3 06/25/2018    PLT 256 06/25/2018    LYMPHS 38 06/25/2018    MONOS 12 06/25/2018    EOS 4 06/25/2018    BASOS 1 06/25/2018     Lab Results   Component Value Date    INR 1.0 06/25/2018     Lab Results   Component Value Date    COLORUA Straw 06/25/2018    APPEARUA Clear 06/25/2018    GLUCOSEUA Negative 06/25/2018    BILIUA Negative 06/25/2018    KETONEUA Negative 06/25/2018    SGUA 1.009 06/25/2018    BLOODUA Negative 06/25/2018    PHUA 7.0 06/25/2018    PROTEINUA Negative 06/25/2018    UROBILUA Negative 06/25/2018    NITRITEUA Negative 06/25/2018    LEUKESTUA Negative 06/25/2018    WBCUA None 06/25/2018    RBCUA None 06/25/2018       Imaging:   01/24/2018 4:02 PM - Electronic Interface To Epic, Radiant Results     Narrative & Impression     EXAM DESCRIPTION:  CERVICAL, THORACIC, AND LUMBAR CT MYELOGRAM WITH RAPID IMMEDIATE AND DELAYED IMAGING.    CLINICAL HISTORY:  Spinal stenosis and leg weakness.    TECHNIQUE:  A CT-guided lumbar puncture was performed and 12.5 mL of Omnipaque 240 was administered via lumbar puncture with the patient on bolster in CT with the head elevated initially. The bolster were gradually removed lowering the head and multiple spot helical images were acquired of the cervicothoracic junction evaluating  for arrival of the contrast from the lumbar region. This procedure is detailed in a separate report dictated the same date in epic. After the contrast reached the cervicothoracic junction, a CT of the cervical and thoracic spine was obtained rapidly immediately after the contrast reached the cervicothoracic junction on spot axial images acquired prior to diagnostic scanning. After allowing for 30 min of contrast leak time a repeat helical CT was performed of the cervical, thoracic, and lumbar spine.    Axial, coronal, and sagittal reconstructions were performed.    CTDI vol: 77.48 mGy, DLP: 3361.64 mGy cm    COMPARISON:  Myelographic images from the same day.  Prior CT myelogram performed at [facility] on 06/11/2011.    FINDINGS:  Contrast adequately opacifies the cervicothoracic and lumbar thecal sac.    On the rapid immediate scan images, there is a pseudomeningocele beginning at C7-T1 immediately posterior to the T1 vertebral body and extending to the C2-3 level. There is dural irregularity at the level of the T1 vertebral body suggesting the dural tear is likely at the C7-T1 level. Additionally, although reversed in nature due to the head down position of the patient, the contrast gradient is also suggestive of the initial dural tear being at the level of the C7-T1 level.    Delayed images demonstrate that the pseudomeningocele is fairly extensive extending from the C2-3 level down to the T7-T8 level.    No active extravasation of contrast was seen on either early or delayed images from the pseudomeningocele. Specifically, no extravasation of contrast was seen from the pseudomeningocele at the C5-C6 level, which was noted on prior scan and repaired by fibrin glue/blood patch at [facility].  Cervical, thoracic, and lumbar spine alignment is normal. No focal osseous lesion is seen. The spinal cord and lumbar nerve roots are normal in appearance for myelographic imaging. Uncovertebral joint degenerative  change and small disc bulges are noted at C3-C4 through C5-C6 without central canal or osseous neural foraminal stenosis. Small disc bulges are noted in the thoracic spine at T5-T6, T6-T7, and T8-T9. There is moderate central canal stenosis at L3-L4 and L4-L5 due to congenital narrowing as well as small broad-based disc bulges. There is mild disc height loss and Schmorl's nodes at multiple levels from T6-T7 to the L3-L4 level. No osseous foraminal stenosis is seen in the cervical, thoracic, or lumbar spine.    The patient is status post L2-3 laminectomy and microdiscectomy. 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 Otherwise, the paraspinal soft tissues and included portions of the abdominal viscera as well as the lungs appear normal for unenhanced CT.               Signed by: Ramonita Lab 01/24/2018 15:59:23  IMPRESSION:  IMPRESSION:  1. 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 Specifically, the active extravasation seen on outside myelogram at [facility] at the C5-C6 level has resolved status post fibrin glue and blood injection. Additionally, there is no significant central canal stenosis created by the pseudomeningocele.    2. Moderate lumbar central canal stenosis at L3-L4 and L4-L5 due to broad-based disc bulges and congenital narrowing. Otherwise, mild degenerative changes of the cervicothoracic and lumbar spine as detailed above.         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  Functional Status: >6 METS  Pending myelogram and thoracic laminectomy - however will be posterior approach and therefore, NOT high risk but intermediate risk.   Patient has a RCRI score of 0, placing  him with a 3.9% perioperative cardiovascular risk, indicating an intermediate risk for an intermediate risk procedure.  His functional status is excellent generally considering his medical issues/limitations, and has no cardiopulmonary indications to not proceed with surgery.    Therefore, labs will be ordered per pre-operative forms, and he is "cleared" to proceed with surgery.    Update: labs reviewed as requested including INR/PTT, UA, CMP and CBC.  Patient is "cleared" for surgery.    >50% of time in 40 min encounter spent discussing preoperative risk and functional status, as well as medication management    Dannel was seen today for surgery clearance.    Diagnoses and all orders for this visit:    Preoperative examination  -     CBC w/ Diff Lavender; Future  -     Comprehensive Metabolic Panel Green; Future  -     Urinalysis with Culture Reflex, when indicated; Future  -     aPTT, Blood Blue; Future  -     Prothrombin Time, Blood Blue; Future    Dural tear    CSF leak    Coccydynia    Screened negative for depression    Screened negative for alcohol use    Screened negative for drug use        Orders this encounter:  Lab   Orders Placed This Encounter  Procedures   . CBC w/ Diff Lavender   . Comprehensive Metabolic Panel Green   . Urinalysis with Culture Reflex, when indicated   . aPTT, Blood Blue   . Prothrombin Time, Blood Blue     Imaging No orders of the defined types were placed in this encounter.    Procedures No orders of the defined types were placed in this encounter.    Other No orders of the defined types were placed in this encounter.      RETURN TO CLINIC INSTRUCTIONS  Return to clinic in 8months) for followup    PATIENT EDUCATION:   Routine primary care is important for your health.  We advise you to see your physician regularly in order to make sure that you maintain optimal wellness.  Remember to check your bottles for medication that you might be taking.  It is best to request a refill when  the last refill has been dispensed on most routine medication.

## 2018-06-25 NOTE — Telephone Encounter (Signed)
Timmothy Sours Tuazon, CPhT  (Rx Refill and [facility])      Previous Rx was sent to [facility]  Per Rx fill hx, LF at [facility]    Beta Blocker Refill Protocol    Last visit: 04/04/2018  Med review    Last pertinent visit:   Next f/u appt due:  04/05/19 PE  Next scheduled appointment: 06/25/18 pre-op cleraance surgery on 3/23       LABS required:  (None)    Monitoring required:  (Q year BP, HR)    (BP range: FEOFHQRF=75-883  GPQDIYMEB=58-30)  Blood Pressure   06/11/18 113/71   05/12/18 118/71   04/04/18 132/74       (HR range: 55-110)  Pulse Readings from Last 3 Encounters:   06/11/18 87   05/12/18 95   04/04/18 74

## 2018-06-25 NOTE — Interdisciplinary (Signed)
Blood drawn from right arm with 21 gauge needle. 3 tubes taken.   Patient identity authenticated by Helen Grace Rasch.

## 2018-06-25 NOTE — Patient Instructions (Signed)
Get blood work  No problems going forward with surgery  Ok to continue all medications perioperatively    See me a few months after (or touch base by MyChart)

## 2018-06-26 ENCOUNTER — Telehealth (INDEPENDENT_AMBULATORY_CARE_PROVIDER_SITE_OTHER): Payer: Self-pay | Admitting: Internal Medicine

## 2018-06-26 ENCOUNTER — Encounter (INDEPENDENT_AMBULATORY_CARE_PROVIDER_SITE_OTHER): Payer: Self-pay | Admitting: Internal Medicine

## 2018-06-26 DIAGNOSIS — R7989 Other specified abnormal findings of blood chemistry: Principal | ICD-10-CM

## 2018-06-26 MED ORDER — PROPRANOLOL HCL CR 60 MG OR CP24
60.00 mg | ORAL_CAPSULE | Freq: Every day | ORAL | 3 refills | Status: AC
Start: 2018-06-26 — End: ?

## 2018-06-26 NOTE — Telephone Encounter (Signed)
Already addressed.  Closing Encounter.    Please reference [place].   Thank you.

## 2018-06-26 NOTE — Telephone Encounter (Signed)
Fax Received    Date Received: 06/25/2018    Date Processed: 06/26/2018    From: Ellin Mayhew of Neurosurgery    Document Type: Per-OP Clearance  received @ 2:52p; located in physician's "LIM to Print" folder ending in 58. Please send to "DONE FOLDER" after printing.     Thank you.

## 2018-06-26 NOTE — Telephone Encounter (Signed)
Fax received and printed. Placed in MD's in box.

## 2018-06-26 NOTE — Progress Notes (Signed)
fyi - Information has been faxed, thank you.

## 2018-06-27 NOTE — Telephone Encounter (Addendum)
Called Pt to follow up on  wether he wanted to proceed with upcoming procedure or cancel it?   Per Pt he will contact billing department and get back to [place] before procedure scheduled for 07/02/18.

## 2018-06-30 ENCOUNTER — Telehealth (INDEPENDENT_AMBULATORY_CARE_PROVIDER_SITE_OTHER): Payer: Self-pay | Admitting: Internal Medicine

## 2018-06-30 NOTE — Telephone Encounter (Signed)
Faxed LOV, and Labs to number provided below.  Confirmation Receipt stapled to Forms.

## 2018-06-30 NOTE — Telephone Encounter (Signed)
Who is calling: Ancillary: Lauren  from [facility] is calling on behalf of patient  Insurance Coverage Verified: Active- in network     Reason for this call: Lauren calling in regards of  the Pre - OP Clearance for patient. Per caller office has not received anything and patient is scheduled this week. Per caller please fax documents to [phone], please advise/assist.     Action required by office: Please fax following items: Pre - OP clearance  and Please contact caller    Duplicate encounter? Yes, please refer to closed encounter dated 03/12     Best way to contact: n/a  Alternative: n/a    Inquiry has been read verbatim to this caller. Verbalizes satisfaction and confirms the above is accurate: yes      Has been advised this message will be transmitted to office and can expect a response within the next 24-72 hours.

## 2018-07-01 ENCOUNTER — Other Ambulatory Visit
Admission: RE | Admit: 2018-07-01 | Discharge: 2018-07-01 | Disposition: A | Discharge status: Home / Self Care | Payer: No Typology Code available for payment source | Attending: Internal Medicine | Admitting: Internal Medicine

## 2018-07-01 ENCOUNTER — Encounter (HOSPITAL_BASED_OUTPATIENT_CLINIC_OR_DEPARTMENT_OTHER): Payer: Self-pay | Admitting: Neurological Surgery

## 2018-07-01 DIAGNOSIS — R7989 Other specified abnormal findings of blood chemistry: Principal | ICD-10-CM | POA: Insufficient documentation

## 2018-07-01 LAB — BASIC METABOLIC PANEL, BLOOD
Anion Gap: 12 mmol/L (ref 7–15)
BUN: 8 mg/dL (ref 6–20)
Bicarbonate: 26 mmol/L (ref 22–29)
Calcium: 9.3 mg/dL (ref 8.5–10.6)
Chloride: 99 mmol/L (ref 98–107)
Creatinine: 0.94 mg/dL (ref 0.67–1.17)
GFR: >60 mL/min
Glucose: 99 mg/dL (ref 70–99)
Potassium: 3.8 mmol/L (ref 3.5–5.1)
Sodium: 137 mmol/L (ref 136–145)

## 2018-07-01 NOTE — Telephone Encounter (Signed)
Who is calling: Ancillary: Lauren from [facility] is calling on behalf of patient  Insurance Coverage Verified: Active- in network  Reason for this call: received the forms but its missing the signed H&P. Per Ander Purpura its urgent since he has surgery scheduled the 19th     Fax [phone]    Action required by office: Please expedite request and Please contact caller    Duplicate encounter? No previous documentation found on this issue.     Best way to contact: [phone]  Alternative:    Inquiry has been read verbatim to this caller. Verbalizes satisfaction and confirms the above is accurate: yes      Has been advised this message will be transmitted to office and can expect a response within the next 24-72 hours.

## 2018-07-01 NOTE — Telephone Encounter (Signed)
Placed on MDs desk to Sign H&P.

## 2018-07-02 ENCOUNTER — Encounter (INDEPENDENT_AMBULATORY_CARE_PROVIDER_SITE_OTHER): Payer: Self-pay | Admitting: Critical Care Medicine

## 2018-07-02 ENCOUNTER — Other Ambulatory Visit (HOSPITAL_BASED_OUTPATIENT_CLINIC_OR_DEPARTMENT_OTHER): Payer: Self-pay

## 2018-07-02 ENCOUNTER — Telehealth (INDEPENDENT_AMBULATORY_CARE_PROVIDER_SITE_OTHER): Payer: Self-pay | Admitting: Neurological Surgery

## 2018-07-02 NOTE — Telephone Encounter (Signed)
Procedure cancelled.

## 2018-07-02 NOTE — Telephone Encounter (Signed)
Noted and signed for return

## 2018-07-02 NOTE — Telephone Encounter (Signed)
Simon with Interventional radiology left a voice mail in the cancellation line to informed us that patient called to cancel apt 07/02/18 at 9:30am.

## 2018-07-03 ENCOUNTER — Encounter (HOSPITAL_COMMUNITY): Payer: Self-pay | Admitting: Hospital

## 2018-07-03 ENCOUNTER — Telehealth (HOSPITAL_COMMUNITY): Payer: Self-pay | Admitting: Cardiovascular Disease

## 2018-07-03 NOTE — Telephone Encounter (Signed)
Covering Nurse faxed Forms with Signature.  Closing this Encounter.

## 2018-07-04 LAB — CYSTATIN C: Cystatin C: 0.6 mg/L (ref 0.5–1.2)

## 2018-07-07 ENCOUNTER — Telehealth (INDEPENDENT_AMBULATORY_CARE_PROVIDER_SITE_OTHER): Payer: Self-pay | Admitting: Internal Medicine

## 2018-07-07 NOTE — Telephone Encounter (Signed)
Printed placed on MD's desk / in box for review.

## 2018-07-07 NOTE — Telephone Encounter (Signed)
Fax Received    Date Received: 07/05/2018    Date Processed: 07/07/2018    From: [facility]    Document Type: Visit Notes    Outside fax imported to patient's media manager and attached to this encounter, please review.    Thank you.

## 2018-07-09 NOTE — Telephone Encounter (Signed)
MD put SCAN on front.  Placed in MR to be scanned to pts chart.  Thank you!

## 2018-07-11 ENCOUNTER — Telehealth (INDEPENDENT_AMBULATORY_CARE_PROVIDER_SITE_OTHER): Payer: Self-pay | Admitting: Internal Medicine

## 2018-07-11 NOTE — Telephone Encounter (Signed)
Error

## 2018-07-14 ENCOUNTER — Encounter (HOSPITAL_BASED_OUTPATIENT_CLINIC_OR_DEPARTMENT_OTHER): Payer: Self-pay

## 2018-07-18 ENCOUNTER — Encounter (INDEPENDENT_AMBULATORY_CARE_PROVIDER_SITE_OTHER): Payer: No Typology Code available for payment source | Admitting: Neurological Surgery

## 2018-07-22 ENCOUNTER — Encounter (HOSPITAL_BASED_OUTPATIENT_CLINIC_OR_DEPARTMENT_OTHER): Payer: Self-pay | Admitting: Pain Medicine

## 2018-07-22 DIAGNOSIS — G4701 Insomnia due to medical condition: Secondary | ICD-10-CM

## 2018-07-22 DIAGNOSIS — G8929 Other chronic pain: Principal | ICD-10-CM

## 2018-07-22 MED ORDER — TIZANIDINE HCL 2 MG OR TABS
ORAL_TABLET | ORAL | 11 refills | Status: DC
Start: 2018-07-22 — End: 2018-07-23

## 2018-07-23 ENCOUNTER — Other Ambulatory Visit (HOSPITAL_BASED_OUTPATIENT_CLINIC_OR_DEPARTMENT_OTHER): Payer: Self-pay | Admitting: Pain Medicine

## 2018-07-23 ENCOUNTER — Encounter (HOSPITAL_BASED_OUTPATIENT_CLINIC_OR_DEPARTMENT_OTHER): Payer: Self-pay | Admitting: Pain Medicine

## 2018-07-23 DIAGNOSIS — G4701 Insomnia due to medical condition: Secondary | ICD-10-CM

## 2018-07-23 DIAGNOSIS — G8929 Other chronic pain: Principal | ICD-10-CM

## 2018-07-23 DIAGNOSIS — G894 Chronic pain syndrome: Secondary | ICD-10-CM

## 2018-07-23 DIAGNOSIS — G4700 Insomnia, unspecified: Principal | ICD-10-CM

## 2018-07-23 MED ORDER — TIZANIDINE HCL 2 MG OR TABS
ORAL_TABLET | ORAL | 11 refills | Status: AC
Start: 2018-07-23 — End: ?

## 2018-07-26 MED ORDER — GABAPENTIN 300 MG OR CAPS
ORAL_CAPSULE | ORAL | 11 refills | Status: DC
Start: 2018-07-26 — End: 2019-03-22

## 2018-08-05 ENCOUNTER — Telehealth (INDEPENDENT_AMBULATORY_CARE_PROVIDER_SITE_OTHER): Payer: Self-pay | Admitting: Internal Medicine

## 2018-08-05 NOTE — Telephone Encounter (Signed)
Fax Received    Date Received: 08/05/18    Date Processed: 08/05/18    From: [facility]    Document Type: HH Order #[id number], #[id number], #[id number].    Outside fax imported to patient's media manager and attached to this encounter, please review.  Thank you.

## 2018-08-05 NOTE — Telephone Encounter (Signed)
Form printed

## 2018-08-06 ENCOUNTER — Telehealth (INDEPENDENT_AMBULATORY_CARE_PROVIDER_SITE_OTHER): Payer: Self-pay | Admitting: Internal Medicine

## 2018-08-06 NOTE — Telephone Encounter (Signed)
Forms printed

## 2018-08-06 NOTE — Telephone Encounter (Signed)
Remotely working, will print when in office

## 2018-08-06 NOTE — Telephone Encounter (Signed)
Fax Received    Date Received: 08/06/18    Date Processed: 08/06/18    From: [facility]     Document Type: HH plan of care- order # [id number] received @ 11:05am; located in physician's "LIM to Print" folder ending in 85. Please send to "DONE FOLDER" after printing.      Outside fax, please review.  Thank you.

## 2018-08-06 NOTE — Telephone Encounter (Signed)
Remotely working.  Will print when in clinic

## 2018-08-11 NOTE — Telephone Encounter (Signed)
Placed on MDs desk for Review/Signature

## 2018-08-14 NOTE — Telephone Encounter (Signed)
Signed form faxed.

## 2018-08-25 ENCOUNTER — Encounter (INDEPENDENT_AMBULATORY_CARE_PROVIDER_SITE_OTHER): Payer: Self-pay | Admitting: Internal Medicine

## 2018-08-25 ENCOUNTER — Encounter (HOSPITAL_BASED_OUTPATIENT_CLINIC_OR_DEPARTMENT_OTHER): Payer: Self-pay | Admitting: Hospital

## 2018-08-25 ENCOUNTER — Encounter (HOSPITAL_BASED_OUTPATIENT_CLINIC_OR_DEPARTMENT_OTHER): Payer: Self-pay | Admitting: Pain Medicine

## 2018-08-25 ENCOUNTER — Encounter (INDEPENDENT_AMBULATORY_CARE_PROVIDER_SITE_OTHER): Payer: Self-pay | Admitting: Neurological Surgery

## 2018-08-25 DIAGNOSIS — M533 Sacrococcygeal disorders, not elsewhere classified: Secondary | ICD-10-CM

## 2018-08-25 DIAGNOSIS — M5416 Radiculopathy, lumbar region: Secondary | ICD-10-CM

## 2018-08-25 NOTE — Telephone Encounter (Signed)
From: [name], PhD  To: [name], MD  Sent: 08/25/2018 2:01 PM PDT  Subject: 20-Other    CC Drs. Acquanetta Chain (neurosurgery) and Samul Dada (physical therapy)  Hi Dr. Selinda Eon,    [facility] you are healthy and well.    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 Rondel Oh in March. Since then, I've had no major CSF leak sxs. So wonderful.    After surgery, I woke from [place] w/ limited mobility and sensation from chest down, which was unanticipated. I subsequently completed 6 wks of in-home PT/OT and regained some mobility and sensation, recently transitioning from walker to cane. I just had repeat cervical/thoracic MRI + follow-up with Dr. Trinna Balloon, and healing is progressing. I have another MRI + follow-up visit is in 2 months, and brain MRI + visit in 1 yr.    Dr. Trinna Balloon (and his NP Rona Ravens) deemed 12 more visits of outpatient PT medically necessary. Order doc is attached. However, I am physically unable to drive at this time, walking distances is limited, [place] miles [place] home, and I don't have access to a driver (my wife works full time and cares for our toddler). I am wondering:    1) Can [facility] provide in-home outpatient PT?    2) If not, I identified a provider in my area that does so. I connected with them and they can and will provide these services. However, I need a referral from [employer] for out of network coverage. Their info is:    Dr. Roxanne Mins, PT, DPT and Dr. Lance Coon, PT, DPT  [facility]  NPI: [id number]  [email or web address]    Thank you very much. All the best,  Erik Burke

## 2018-08-25 NOTE — Telephone Encounter (Signed)
I am ok signing home PT    If his insurance requires a F4f, we can do video

## 2018-08-25 NOTE — Telephone Encounter (Signed)
Routed to MD to Advise.

## 2018-08-26 ENCOUNTER — Encounter (INDEPENDENT_AMBULATORY_CARE_PROVIDER_SITE_OTHER): Payer: Self-pay | Admitting: Internal Medicine

## 2018-08-26 NOTE — Telephone Encounter (Addendum)
Routed to MD to Sign Pended Referral.  Pt doesn't need F2F.

## 2018-09-03 ENCOUNTER — Encounter (HOSPITAL_BASED_OUTPATIENT_CLINIC_OR_DEPARTMENT_OTHER): Payer: Self-pay

## 2018-09-19 NOTE — Telephone Encounter (Signed)
error 

## 2018-09-20 ENCOUNTER — Encounter (HOSPITAL_BASED_OUTPATIENT_CLINIC_OR_DEPARTMENT_OTHER): Payer: Self-pay | Admitting: Hospital

## 2018-10-07 ENCOUNTER — Encounter (HOSPITAL_BASED_OUTPATIENT_CLINIC_OR_DEPARTMENT_OTHER): Payer: Self-pay

## 2018-11-26 ENCOUNTER — Encounter (INDEPENDENT_AMBULATORY_CARE_PROVIDER_SITE_OTHER): Payer: Self-pay | Admitting: Internal Medicine

## 2018-11-26 DIAGNOSIS — M533 Sacrococcygeal disorders, not elsewhere classified: Secondary | ICD-10-CM

## 2018-11-27 NOTE — Telephone Encounter (Signed)
From: [name], PhD  To: [name], MD  Sent: 11/26/2018 11:03 AM PDT  Subject: 20-Other    Hi Dr. Selinda Eon,    [facility] all is well. 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 I brought this up with Dr. Vito Berger NP Ernst Bowler and their response is below.     I understand the first year following sc injury is crucial for healing and was wondering if I could have an appointment (maybe with a physiatrist) so they can assess sxs and so I can have a better understanding of what I need to do in order to keep regaining sensation and functioning. I was connected with a good PT who helped me during the initial stages of physical therapy following surgery (which I continue to do weekly) but he had limited knowledge of the nature and course of this type of injury.     Thanks!  Erik Burke      ...  If you are concerned about mobility, then you should see a Physiatrist, physical medicine and rehab physician who can answer these questions. Generally, we advise patients to proceed with activities that they can tolerate. Therefore, if sitting one hour per patient and taking a walking break every 2 hours is good for you, then do it. If walking every 3-4 hours is better for your body, then you can do that. However, as far as advising a specific frequency, then a physiatrist is the best physician to answer that for you.

## 2018-11-27 NOTE — Telephone Encounter (Signed)
Routed to MD to Advise.

## 2018-11-28 ENCOUNTER — Encounter (INDEPENDENT_AMBULATORY_CARE_PROVIDER_SITE_OTHER): Payer: Self-pay | Admitting: Internal Medicine

## 2018-11-28 ENCOUNTER — Encounter (INDEPENDENT_AMBULATORY_CARE_PROVIDER_SITE_OTHER): Payer: Self-pay

## 2018-11-28 NOTE — Telephone Encounter (Signed)
From: [name], Erik Burke  To: [name], MD  Sent: 11/28/2018 11:02 AM PDT  Subject: 20-Other    Perfect, thanks Caryl Pina       ----- Message -----   From:Ashley Garnette Gunner   Sent:11/28/2018 10:45 AM PDT   NO:[id number] Erik Cumins, Erik Burke   Subject:RE: 20-Other    Good Morning,     You have been referred to the Department of Orthopedics for a clinic consult. Please call to schedule an appointment at your earliest convenience. Insurance varies from person to person and you may also need an insurance authorization prior to being seen.     Orthopedics Department scheduling phone numbers:     Orthopedics Scheduling Phone number:   [phone]     Locations:     [place]:   [address], [place], [place] [address]     [place] [facility]):   [address], [place], [place] [address]     [place] ([place]):   [place] [address]     [place] ([place])   [address] [place], [place] [address]     [place] ([place])   [address] [address], [place] [address]       ----- Message -----   From:Auguste Erik Cumins, Erik Burke   Sent:11/27/2018 4:22 PM PDT   PV:VZSM Lelon Frohlich, MD   Subject:RE: 20-Other    Referral would be great. Yes, I started this process of re-identifying the dural tear with Dr. Radene Knee. Looks like he focuses more on back related injuries which is a good fit, but I'm open to both. Thanks!      ----- Message -----   From:Ryan Lelon Frohlich, MD   Sent:11/27/2018 11:08 AM PDT   OL:[id number] Erik Cumins, Erik Burke   Subject:RE: Dewain Penning, you've seen Dr. Radene Knee, right?  He is PM&R.  He also works with Dr. Flavia Shipper, who I also share a number of patients.  I suggest either one of those to start.  Need a new referral to ortho? (their in that Dept)  [name]      ----- Message -----   From:Erik Erik Cumins, Erik Burke   Sent:11/26/2018 11:03 AM PDT   GB:EEFE Lelon Frohlich, MD    Subject:20-Other    Hi Dr. Selinda Eon,    [facility] all is well. 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 I brought this up with Dr. Vito Berger NP Ernst Bowler and their response is below.     I understand the first year following sc injury is crucial for healing and was wondering if I could have an appointment (maybe with a physiatrist) so they can assess sxs and so I can have a better understanding of what I need to do in order to keep regaining sensation and functioning. I was connected with a good PT who helped me during the initial stages of physical therapy following surgery (which I continue to do weekly) but he had limited knowledge of the nature and course of this type of injury.     Thanks!  Erik Burke      ...  If you are concerned about mobility, then you  should see a Physiatrist, physical medicine and rehab physician who can answer these questions. Generally, we advise patients to proceed with activities that they can tolerate. Therefore, if sitting one hour per patient and taking a walking break every 2 hours is good for you, then do it. If walking every 3-4 hours is better for your body, then you can do that. However, as far as advising a specific frequency, then a physiatrist is the best physician to answer that for you.

## 2018-12-02 ENCOUNTER — Telehealth (HOSPITAL_BASED_OUTPATIENT_CLINIC_OR_DEPARTMENT_OTHER): Payer: Self-pay | Admitting: Orthopedics

## 2018-12-02 NOTE — Telephone Encounter (Signed)
Patient called back to schedule appointment with Dr. Radene Knee per patient he had surgery 06/2018 in [place] and believes this dx is due to the surgery he had. Call was dropped was not able to provide information below.  When patient calls back please obtain more information .      Per Mickel Baas T:     so if he had sx on his back, he needs to continue care w/that surgeon then. if he is having other issues not related to his surgery then we can see him, but idk if chang or vitale would be appropriate at that point.

## 2018-12-02 NOTE — Telephone Encounter (Signed)
Left vm for pt to call back. Pt has seen both phys med and Dr. Tally Due. Please schedule pt when he calls back.

## 2018-12-08 ENCOUNTER — Encounter (HOSPITAL_BASED_OUTPATIENT_CLINIC_OR_DEPARTMENT_OTHER): Payer: Self-pay | Admitting: Hospital

## 2018-12-08 ENCOUNTER — Telehealth (HOSPITAL_BASED_OUTPATIENT_CLINIC_OR_DEPARTMENT_OTHER): Payer: Self-pay | Admitting: Nurse Practitioner

## 2018-12-08 NOTE — Telephone Encounter (Signed)
Will send to provider. I have sent MyChart message to patient with Dr. Clifton Custard phone number and D.r Danley Danker.

## 2018-12-09 ENCOUNTER — Encounter (HOSPITAL_BASED_OUTPATIENT_CLINIC_OR_DEPARTMENT_OTHER): Payer: Self-pay | Admitting: Nurse Practitioner

## 2018-12-12 ENCOUNTER — Encounter (INDEPENDENT_AMBULATORY_CARE_PROVIDER_SITE_OTHER): Payer: Self-pay | Admitting: Orthopaedic Surgery

## 2018-12-12 ENCOUNTER — Encounter (INDEPENDENT_AMBULATORY_CARE_PROVIDER_SITE_OTHER): Payer: No Typology Code available for payment source | Admitting: Orthopaedic Surgery

## 2018-12-12 ENCOUNTER — Encounter (INDEPENDENT_AMBULATORY_CARE_PROVIDER_SITE_OTHER): Payer: Self-pay

## 2018-12-12 ENCOUNTER — Ambulatory Visit (INDEPENDENT_AMBULATORY_CARE_PROVIDER_SITE_OTHER): Payer: No Typology Code available for payment source | Admitting: Orthopaedic Surgery

## 2018-12-12 VITALS — BP 140/78 | HR 81 | Temp 97.8°F

## 2018-12-12 DIAGNOSIS — M961 Postlaminectomy syndrome, not elsewhere classified: Secondary | ICD-10-CM

## 2018-12-12 DIAGNOSIS — G96198 Other disorders of meninges, not elsewhere classified: Secondary | ICD-10-CM

## 2018-12-12 DIAGNOSIS — G9619 Other disorders of meninges, not elsewhere classified: Secondary | ICD-10-CM

## 2018-12-12 NOTE — Progress Notes (Signed)
Attending Note:   Dr. Selinda Eon, Lanetta Inch requests Orthopaedic Surgical consultation for the patient Erik Limbo, PhD for problems related to had concerns including Back Pain (Upper back)..    My report follows:    Subjective:   I reviewed the history and medical record, and interviewed and examined the patient.    History of present illness (HPI): 38 year old male here for Orthopaedic Surgical evaluation to treat his pain and functional impairment.    This is a patient last seen by me in 04-2016: "Impression: Right leg sciatica symptoms. HX 2014 with lami/discectomy at L3/4 done at [facility].  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    He had CT guided [facility] 2017.    Plan: The recommended plan is for    Caudal epidural steroid injection: we talked about the risk of spinal headache/dural tear, nerve injury/infection/bleeding.    MRI Pelvis bone/sacral plexus study    Upload prior imaging and records    Coordinate care with Pain Dr Trinna Post"      Since that time he has been established with [facility], [facility], [facility] and  IR. More recently a 11-28-18 note to his PCP reads:    From:Balen A Groot, PhD    Sent:11/26/2018 11:03 AM PDT     XO:[id number] Lelon Frohlich, MD  Subject:20-Other    Hi Dr. Selinda Eon,    [facility] all is well. 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 I brought this up with Dr. Vito Berger NP Ernst Bowler and their response is below.     I understand the first year following sc injury is crucial for healing and was wondering if I could have an appointment (maybe with a physiatrist) so they can assess sxs and so I can have a better understanding of what I need to do in order to keep regaining sensation and functioning. I was connected with a good PT who helped me during the  initial stages of physical therapy following surgery (which I continue to do weekly) but he had limited knowledge of the nature and course of this type of injury.     Thanks!  [name]      The patient relates a T spine surgical procedure at another institution to revise the spine surgery he got at [facility]. Since then he reports T spine SCI symptoms, relearning his gait etc. 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 He comes today seeking additional expertise in neurological rehabilitation.    I reviewed the past medical history/problem list, allergies, medications, family history and social history from the [facility]   Patient Active Problem List   Diagnosis   . Hyperlipidemia   . Back pain   . Parasomnia   . Acne, unspecified acne type   . Hyperhidrosis   . Dural tear   . Postlaminectomy syndrome of lumbar region   . Coccydynia   . Eyelid lesion   . Post laminectomy syndrome   . Lumbar radiculopathy, chronic   . Sacral radiculopathy   . Intracranial hypotension   . Sphenopalatine ganglion neuralgia   . CSF leak   . Coccygeal pain   . Lumbar radiculopathy   . Pseudomeningocele     Allergies   Allergen Reactions   . Atorvastatin Other  Debilitating myalgias   . Pravastatin Other     Debilitating myalgias   . Rosuvastatin Other     Debilitating myalgias   . Zetia [Ezetimibe] Other     Debilitating myalgias     Current Outpatient Medications   Medication Sig Dispense Refill   . acetaZOLAMIDE (DIAMOX) 250 MG tablet Take 1 tablet (250 mg) by mouth 3 times daily. 90 tablet 1   . Alirocumab (PRALUENT) 150 MG/ML SOAJ Inject 75 mg under the skin every 14 days. 1 Box of pens 11   . clonazePAM (KLONOPIN) 0.5 MG tablet Take 1 tablet (0.5 mg) by mouth at bedtime. 30 tablet 0   . desonide (DESOWEN) 0.05 % cream Apply 1 Application topically 2 times daily. apply to affected areas 15 g 1   . gabapentin (NEURONTIN) 300 MG capsule 1-2 caps nightly as needed for pain/sleep 60 capsule 11   .  meloxicam (MOBIC) 15 MG tablet Take 1 tablet (15 mg) by mouth at bedtime. 30 tablet 0   . propranolol (INDERAL LA) 60 MG CP24 Take 1 capsule (60 mg) by mouth daily. 90 capsule 3   . tizanidine (ZANAFLEX) 2 MG tablet 1-2 tabs nightly as needed for pain/sleep 60 tablet 11   . tretinoin (RETIN-A) 0.025 % cream Use a pea sized amount on the entire face nightly.  At first, you may use every other night to avoid dryness and irritation, and gradually increase use to nightly as tolerated 45 g 3     No current facility-administered medications for this visit.      Family History   Problem Relation Name Age of Onset   . Cancer Mother          cervical   . No Known Problems Father     . Cancer Maternal Grandmother          ovarian   . No Known Cancer Maternal Grandfather     . Heart Disease Maternal Grandfather     . Cancer Paternal Grandmother          pancreatic   . Cancer Paternal Grandfather          mesothelioma   . Cancer Brother  17        osteosarcoma   No hereditary or high-risk disease identified otherwise.    Social History     Tobacco Use   . Smoking status: Never Smoker   . Smokeless tobacco: Never Used   Substance Use Topics   . Alcohol use: Yes     Comment: 1-3 drinks infrequently   . Drug use: Not on file       Review of Systems: as indicated in the history of present illness and per the intake form. All others reviewed and were negative.    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 Care delivered by multiple providers both at [facility] and across [place] and the country.    This patient does not have a condition amenable to Orthopaedic surgery at this time. In the meanwhile, here are management suggestions we  discussed:  Medication management - continue current plan    We spoke about neuro-rehab resources across [facility].    Consultation initiated at [facility] to Dr Christy Sartorius. Collaborative care resources at [facility] discussed.    Follow up: PRN     More than half of the 15 minute clinic appointment time was spent face-to-face with the patient to provide education, counseling and coordination of care. We discussed issues as described above. All of the patient's questions were answered to their satisfaction.    Macon Large, M.D., Ph.D.  Clinical Professor, [facility] Department of Orthopaedic Surgery  Chief, Physical Medicine and [place] medical consultant    [email or web address]    CC: [name]

## 2018-12-12 NOTE — Patient Instructions (Signed)
It was a pleasure seeing you today. Today, we discussed:     1. Dr Christy Sartorius consultation for spinal cord neuro-rehab needs    2. [facility] collaborative care resources:    Dr Maxie Barb  Dr Charleston Ropes     3. We have access to [facility] health records/documents (not images themselves)    Take care!  Macon Large, MD PhD

## 2018-12-17 ENCOUNTER — Telehealth (HOSPITAL_BASED_OUTPATIENT_CLINIC_OR_DEPARTMENT_OTHER): Payer: Self-pay | Admitting: Orthopaedic Surgery

## 2018-12-17 ENCOUNTER — Encounter (INDEPENDENT_AMBULATORY_CARE_PROVIDER_SITE_OTHER): Payer: Self-pay | Admitting: Orthopaedic Surgery

## 2018-12-17 NOTE — Telephone Encounter (Signed)
Hi Dr. Ocie Bob referred a pt to the pain clinic for rehab the order that was sent to [place] is not correct can you please place  the order under a PM&R for Dr. Francesca Jewett. The order is for the pain clinic and he is an established patient with Dr.Schuster thank you for your time.

## 2018-12-18 ENCOUNTER — Encounter (INDEPENDENT_AMBULATORY_CARE_PROVIDER_SITE_OTHER): Payer: Self-pay | Admitting: Orthopaedic Surgery

## 2018-12-18 NOTE — Telephone Encounter (Signed)
From: [name], PhD  To: Denzil Hughes, MD  Sent: 12/17/2018 5:21 PM PDT  Subject: 20-Other    Hi Dr. Radene Knee,    The Pain Management front desk indicated they need a PM&R referral from your end in order to see Dr. Francesca Jewett (since I was originally connected with Dr. Thereasa Parkin or pain management).    Would your office be able to do that?    Thank you,  [name]

## 2018-12-19 NOTE — Telephone Encounter (Signed)
From: [name], PhD  To: Erik Hughes, MD  Sent: 12/18/2018 1:49 PM PDT  Subject: Erik Burke, thanks for the note. The front desk person at Pain Management (I forgot her name) stated that it is in their "protocol" to route a patient to their previously-assigned pain management provider, and that I wouldn't be able to see Erik Burke without direct consultation with Erik Burke, but because Erik Burke is on leave I would need to see one of his covering providers. I reminded her that Erik Burke referred me to Erik Burke specifically, but she stated that does not matter because it is against their protocol and that she would need to speak with her manager about this, and in the meantime advised me to send a note to Erik Burke requesting PM&R referral. I asked her if it would be possible for Erik Burke (or one of Erik Burke covering providers) to speak directly with Erik Burke and she stated no, that it has to be Erik Burke speaking with Erik Burke.     I'm not sure why this would be the case when a provider is on leave, and I'm just passing on the message from her-- I personally don't understand the nuances of this protocol so I'm sorry I can't be much help besides providing this info. Let me know if I can do anything to facilitate this.     Thank you       ----- Message -----   From:Erik Burke   Sent:12/18/2018 12:50 PM PDT   NE:[id number] Erik Cumins, PhD   Subject:RE: 20-Other    Erik Burke,    Dr Radene Burke has reviewed your message and is advising that he has contacted Dr Francesca Burke (because he is technically in the department of Pain Management). He is unsure of what the latest orders are (because we don't technically have a "PM&R" consultation or clinic at [facility]).       ----- Message -----   From:Erik Erik Cumins, PhD   Sent:12/17/2018 5:21 PM PDT   AZ:[id number] Erik Commander, MD   Subject:20-Other    Hi Erik Burke,    The Pain Management front desk indicated they  need a PM&R referral from your end in order to see Erik Burke (since I was originally connected with Erik Burke or pain management).    Would your office be able to do that?    Thank you,  Erik Burke

## 2018-12-24 NOTE — Telephone Encounter (Signed)
Trina/Denise:  Can you please schedule an appointment with this patient to see Dr. Francesca Jewett?

## 2018-12-25 ENCOUNTER — Telehealth (HOSPITAL_BASED_OUTPATIENT_CLINIC_OR_DEPARTMENT_OTHER): Payer: Self-pay | Admitting: Pain Medicine

## 2018-12-25 NOTE — Telephone Encounter (Signed)
Call placed to schedule an NEW PM&R appointment with Dr. Francesca Jewett (approved per Michelle/MD staff message from 12/23/08). LVM 12/25/18 @ 8:30 am.

## 2018-12-25 NOTE — Telephone Encounter (Signed)
Pt has been referred to Dr. Francesca Jewett for "patient with SCI symptoms s/p dural tear repair surgery. Desire consult/establishment of care with Dr Christy Sartorius to eval and treat with neuro-rehab". Pt is not sure if his being refer for PM&R or for transfer of care from Dr. Thereasa Parkin. Please review and advise.

## 2018-12-25 NOTE — Telephone Encounter (Signed)
Issue has been resolved and clarify pt is now scheduled for PM&R with Dr. Francesca Jewett.

## 2018-12-26 ENCOUNTER — Encounter (INDEPENDENT_AMBULATORY_CARE_PROVIDER_SITE_OTHER): Payer: Self-pay | Admitting: Orthopaedic Surgery

## 2018-12-31 ENCOUNTER — Encounter (INDEPENDENT_AMBULATORY_CARE_PROVIDER_SITE_OTHER): Payer: Self-pay | Admitting: Hospital

## 2019-01-05 ENCOUNTER — Ambulatory Visit: Payer: No Typology Code available for payment source | Attending: Pain Medicine | Admitting: Pain Medicine

## 2019-01-05 ENCOUNTER — Other Ambulatory Visit: Payer: Self-pay

## 2019-01-05 ENCOUNTER — Encounter (INDEPENDENT_AMBULATORY_CARE_PROVIDER_SITE_OTHER): Payer: Self-pay | Admitting: Internal Medicine

## 2019-01-05 ENCOUNTER — Encounter (HOSPITAL_BASED_OUTPATIENT_CLINIC_OR_DEPARTMENT_OTHER): Payer: Self-pay | Admitting: Pain Medicine

## 2019-01-05 VITALS — BP 116/81 | HR 83 | Temp 98.2°F

## 2019-01-05 DIAGNOSIS — M549 Dorsalgia, unspecified: Secondary | ICD-10-CM | POA: Insufficient documentation

## 2019-01-05 DIAGNOSIS — Z23 Encounter for immunization: Secondary | ICD-10-CM

## 2019-01-05 DIAGNOSIS — S24101A Unspecified injury at T1 level of thoracic spinal cord, initial encounter: Secondary | ICD-10-CM | POA: Insufficient documentation

## 2019-01-05 NOTE — Progress Notes (Signed)
PAIN/PM&R NEW CONSULT NOTE    Referring Physician Denzil Hughes  Primary Care Physician [name]    History of Illness:  38 yo M with complex spinal history who presents for PM&R evaluation. Regarding his history, he had a spontaneous thoracic ventral dural tear in 2012 which was then patched (fibrin/glue) at [facility]. His sx include orthostatic headaches, popping in ears, tingling in face. He notes that his patch held for a few years, then he started getting sacral pain and popping ears (ears are a huge marker of his dural tear acting up.) In March of 2020, he underwent a CSF repair with t1/2 lami that was c/b spinal cord injury, with loss of sensation and motor from the chest down. He notes that his op record demonstrates loss of SSEPs. He notes patches of numbness as well as loss of LE proprioception - he states multiple times he has tripped or fallen because he doesn't know where his limbs are in space. He also endorses upper back back which wraps around from his incision site. He states that his recovery from this most recent surgery has been more difficult than before - he is requiring more medications and having more difficulty with recovery. He has had plenty of PT and continues to work on PT at home - he feels a sharp pain in the middle of the back when he is doing extension and overhead type workouts, or lifting his 36 year old - this has him concerned because he doesn't want to overdo it. His other symptoms of dural tear are improved since surgery, however.     Current Pain Medications:  Using Cannabis (Dr. Linus Mako) which helps to sleep  Gaba 600, Tizanidine 4 mg    Pain Treatment History:  Other Pain Providers: yes Thereasa Parkin  Physical Therapy: yes - since sc injury, many PT sessions; Over the last 12 months, they have done over 10 sessions. PT helped partially. The patient is doing a home exercise program: designed by PT     Interventional pain procedures:  Caudal ESI in past, 2019    Current  Description of Symptoms:  They describe their pain as tingling, aching, nagging and numbing. Patient states their pain is associated with numbness, tingling and pins and needles. This pain has made it hard for the patient to sleep, work, exercise, eat, be with family and enjoy life.    The patient endorses, saddle anesthesia, however, this is patchy. He has full bladder/bowel control.     The patient stated their pain today is Pain Score: 3/10.   Over the past week the patient's pain has been at its worst 8/10, at best 3/10 and averages 4/10.   During the past week, it has interfered with enjoyment of life 7/10 and general activity 7/10.    Significant PMH:   Past Medical History:   Diagnosis Date   . Acne    . Back pain     secondary to herniated disc, from back injury in 2000   . Hyperhidrosis     propanolol tx   . Hyperlipidemia     LDL > 200, TG ~400 at Dx, statin intolerant.  Per pt, r/o for familial HDL   . Parasomnia associated with sexual behavior     Tx with Caprice Renshaw, has been being downtitrated     Past Surgical History:   Procedure Laterality Date   . l3/4 laminectomy  2014    @ [facility], 2014   . epidural steroid  injection      x5 ESIs, most ecent 2017     Social History     Socioeconomic History   . Marital status: Married     Spouse name: Not on file   . Number of children: Not on file   . Years of education: Not on file   . Highest education level: Not on file   Occupational History   . Not on file   Social Needs   . Financial resource strain: Not on file   . Food insecurity     Worry: Not on file     Inability: Not on file   . Transportation needs     Medical: Not on file     Non-medical: Not on file   Tobacco Use   . Smoking status: Never Smoker   . Smokeless tobacco: Never Used   Substance and Sexual Activity   . Alcohol use: Yes     Comment: 1-3 drinks infrequently   . Drug use: Not on file   . Sexual activity: Not on file   Lifestyle   . Physical activity     Days per week: Not on file     Minutes  per session: Not on file   . Stress: Not on file   Relationships   . Social Product manager on phone: Not on file     Gets together: Not on file     Attends religious service: Not on file     Active member of club or organization: Not on file     Attends meetings of clubs or organizations: Not on file     Relationship status: Not on file   . Intimate partner violence     Fear of current or ex partner: Not on file     Emotionally abused: Not on file     Physically abused: Not on file     Forced sexual activity: Not on file   Other Topics Concern   . Not on file   Social History Narrative    PhD in Psychology, works in [employer], [employer]     Additional Social History:   Currently working/school: yes - PhD, clinical psychologist  Open legal case related to pain: no  Alcohol or substance abuse/use: no  History of DUI: no. History of alcohol/substance abuse treatment: no  Current exercise: yes - PT and recumbent bike  History of childhood sexual abuse: no  History of Depression/Anxiety/Mental Illness: no    Risk score based on score of opioid risk tool.    Family History   Problem Relation Name Age of Onset   . Cancer Mother          cervical   . No Known Problems Father     . Cancer Maternal Grandmother          ovarian   . No Known Cancer Maternal Grandfather     . Heart Disease Maternal Grandfather     . Cancer Paternal Grandmother          pancreatic   . Cancer Paternal Grandfather          mesothelioma   . Cancer Brother  53        osteosarcoma     Additional Family History:  Family history of Alcoholism: no  Family history of Substance Abuse: no    Review of Systems  Constitutional: Negative  Eyes: Negative  ENT: Negative  Cardiac: Negative  Pulmonary: Negative  Gastrointestional: Negative  Musculoskeletal: Negative  Skin: Negative  Neurologic: Negative  Psychiatric: Negative  Remainder of complete ROS is negative except as above and scanned under Media.    Current Outpatient Medications   Medication Sig  Dispense Refill   . acetaZOLAMIDE (DIAMOX) 250 MG tablet Take 1 tablet (250 mg) by mouth 3 times daily. 90 tablet 1   . Alirocumab (PRALUENT) 150 MG/ML SOAJ Inject 75 mg under the skin every 14 days. 1 Box of pens 11   . clonazePAM (KLONOPIN) 0.5 MG tablet Take 1 tablet (0.5 mg) by mouth at bedtime. 30 tablet 0   . desonide (DESOWEN) 0.05 % cream Apply 1 Application topically 2 times daily. apply to affected areas 15 g 1   . gabapentin (NEURONTIN) 300 MG capsule 1-2 caps nightly as needed for pain/sleep 60 capsule 11   . meloxicam (MOBIC) 15 MG tablet Take 1 tablet (15 mg) by mouth at bedtime. 30 tablet 0   . propranolol (INDERAL LA) 60 MG CP24 Take 1 capsule (60 mg) by mouth daily. 90 capsule 3   . tizanidine (ZANAFLEX) 2 MG tablet 1-2 tabs nightly as needed for pain/sleep 60 tablet 11   . tretinoin (RETIN-A) 0.025 % cream Use a pea sized amount on the entire face nightly.  At first, you may use every other night to avoid dryness and irritation, and gradually increase use to nightly as tolerated 45 g 3     No current facility-administered medications for this visit.      Allergies   Allergen Reactions   . Atorvastatin Other     Debilitating myalgias   . Pravastatin Other     Debilitating myalgias   . Rosuvastatin Other     Debilitating myalgias   . Zetia [Ezetimibe] Other     Debilitating myalgias     Physical Exam:   Physical Exam  Vitals: BP 116/81 (BP Location: Left arm, BP Patient Position: Sitting, BP cuff size: Large)   Pulse 83   Temp 98.2 F (36.8 C) (Temporal)   General: healthy, alert, no distress  Mental Status:  alert, oriented x 3. Speech is clear/ normal  Affect: euthymic  Skin: back incisions well-healed, no erythema or fluctuance  HEENT:  Pupils equal, not pinpoint.  Pulmonary:  Breathing easily without tachypnea or bradypnea.  Cardiac:  No LE edema.  Abdomen: soft, not distended  Ambulation: Pt is able to raise from a seated position without difficulty. Gait is not antalgic and the patient  ambulates without assistance.   Neuro/Musculoskeletal:   Tender, taught trigger points of the bilateral thoracic and lumbar paraspinal musculature, involving levator scapulae.    Upper Extremities  Shoulders ROM: Right full without pain; Left full without pain  .  Motor Strength                                     Left Right   Shoulder Abduction:   5/5 5/5  Biceps:            5/5 5/5  Triceps:         5/5 5/5  Wrist Extension:         5/5 5/5  Wrist Flexion:             5/5 5/5  Interosseous:             5/5 5/5  Reflexes:  Biceps:  Bilateral 2+  Triceps:            Bilateral 2+    Lower-Extremities  Motor Strength                                     Left Right   Iliopsoas:  5/5 5/5  Quadriceps:  5/5 5/5  Hamstrings:    5/5 5/5  Ankle Dorsiflexion:   5/5 5/5  Ankle Plantarflexion:  5/5 5/5  Reflexes  Patella:    Right: Trace left 2+  Achilles:  Bilateral 2+  Sensory Exam  Patchy, subjective sensory loss    Diagnosis  Encounter Diagnoses   Name Primary?   . Spinal cord injury at T1-T6 level (CMS-HCC) Yes   . Trigger point with back pain      Assessment  38 yo M with history of thoracic ventral dural tear s/p patch in 2012, with recurrence in 2019, now s/p T1/2 lami with repair of csf leak c/b intraoperative spinal cord injury. He presents for PM&R and Pain evaluation in the setting of his SCI with surgery in march - regarding his recovery, he is doing well and able to complete ADLs, in addition to working, exercising and taking care of his child. His examination is consistent with an appropriately healing mild SCI, with only patchy sensory loss on examination, though this is still disconcerting to him. He also has trigger points near in relation to his surgical site - which could be targeted with TPI. Per patient's wishes, we will wait a little while before scheduling. He is currently managing his pain and spasms well at home with his regimen - and is thoughtful and appropriate regarding the use of  gabapentin, cannabis and tizanidine to assist him with full sleep.     PLAN  Interventions: We are recommending a trigger point injection in our procedures suite. The pt has been educated regarding the risks (including bleeding, infection, increased pain, nerve damage, or allergic reaction), benefits, and alternatives. They state they understand and are eager to proceed when they need it.     Medication recommendations:  - No changes    Other:   - Continue physical therapy    We also recommend the patient continue a low impact exercise program such as aqua therapy or recumbent bike as tolerated to improve cardiovascular function, core strength, and flexibility.     Follow-up: after above, PRN    Thank you for the consultation, please call with any questions.

## 2019-01-05 NOTE — Patient Instructions (Addendum)
Follow up in clinic as needed - it was a pleasure to meet you today. We can schedule the trigger point injections when/if you need them.     ===================    Please allow up to 14 business days for the authorization to be processed and you will be contacted to schedule your procedure once it has been approved.    Please call [phone] To Check on Status of Authorization    Please call [phone] option #0 To Schedule Your Procedure once it's appoved    Follow Up Appointment  After procedure     If you have any questions please don't hesitate to call our clinic at [phone].    ======================    To promote a safe environment for our patients, [facility] asks for COVID testing for all patients scheduled for a clinic appointments. Due to your up-coming procedure, your provider has placed an order for you to receive COVID testing.     Please follow the below instructions to schedule your testing appointment:     Please call [phone] to schedule an appointment at one of our drive-up testing sites. An appointment is REQUIRED in order to get the test performed.      We currently have 5 locations you can choose from:     . [facility] [place], [place], [place] [address]   . [place], [place], [place] [address]   . [place], [place], parking lot [phone].  . [place], [place], [place] [address]   . [place], [address], [place], [place] [address]     Testing is available 7 days/ week 8a-4:30p, except for 12p-1p for lunch     You testing appointment needs to be within 72 hrs. (3 days) or less BEFORE your procedure appointment. Current result turnaround time is about 24 hrs, but results can take up to 72 hrs to process.     If you have any questions regarding testing for your pain procedure, please call the Pain Management call center at [phone].    Please also remember there is a risk of coming to the hospital  during COVID-19 pandemic.  The procedure will not be done if there are symptoms present suggesting this illness.  There will be COVID-19 screening before entering the medical center.  This may prevented you from entering the building and require rescheduling.      If your COVID test is positive we will contact you and recommend further care.   Your procedure will then be rescheduled.    When you come in for your procedure you will be given a mask and will be asked to wear it at all times while in the hospital.   Family/significant others will not be allowed to come in with you for the procedure.      Last, these recommendations are based on current beliefs and guidelines.  If conditions change, your procedure could be canceled/rescheduled with little to no notice.      =======================      What is a Trigger Point?  These are tight, dysfunctional, and painful bands of muscle that are often located in the neck, upper back, and lower back.     These trigger points are usually very tender to applied pressure, and can often radiates pain outwards when pressed. Over time, this muscular tension can reduce blood flow to the painful regions and  worsen the pain cycle.     This condition is often referred to as myofascial pain syndrome by healthcare providers.     What is a Doctor, hospital?  A trigger point injection involves the use of a small needle to administer local anesthetic medication directly into painful areas within the muscles. This helps to relax the muscles and break the cycle of dysfunction. Your physician may also use a technique known as dry needling to help break up the tight tissue and stimulate increased blood flow to the area.      In certain cases, your physician may choose to inject Botox (botulinum toxin) into the trigger points. This medication reduces muscle contraction at a molecular level, and can also be very effective in treating myofascial pain.     How Are Trigger Point  Injections Performed?  You will be asked to sit or lie in a position in which the affected are be accessible to your physician.     Cleaning solution is applied to site of the pain. Your physician will then use a small needle to inject some local anesthetic medication into each of the painful areas of the affected muscles.     For trigger point injections of certain structures, such as the piriformis muscle, image guidance with either ultrasound or fluoroscopy (X-ray) will be utilized to ensure accurate placement of the needle.     Risks and Complications:  Trigger point injections are considered very safe in general.     However, as with any minor medical procedure, there are potential risks. This includes soreness, bleeding, bruising, or infection. If the procedure is done in the upper back or chest, then pneumothorax (collapsed lung) is also a potential rare complication.     We will take every measure to minimize these potential risks and maximize the therapeutic benefit.

## 2019-01-05 NOTE — Telephone Encounter (Signed)
From: [name], PhD  To: [name], MD  Sent: 01/05/2019 9:57 AM PDT  Subject: 20-Other    Hi Dr. Selinda Eon, do you mind putting in an order for a flu shot? (I was just told by [facility] staff that needs to come from PCP) Thanks!

## 2019-01-27 ENCOUNTER — Other Ambulatory Visit (INDEPENDENT_AMBULATORY_CARE_PROVIDER_SITE_OTHER): Payer: No Typology Code available for payment source

## 2019-02-16 ENCOUNTER — Encounter (INDEPENDENT_AMBULATORY_CARE_PROVIDER_SITE_OTHER): Payer: Self-pay | Admitting: Dermatology

## 2019-02-16 DIAGNOSIS — Z1283 Encounter for screening for malignant neoplasm of skin: Secondary | ICD-10-CM

## 2019-02-17 ENCOUNTER — Encounter (INDEPENDENT_AMBULATORY_CARE_PROVIDER_SITE_OTHER): Payer: Self-pay | Admitting: Hospital

## 2019-02-25 ENCOUNTER — Encounter (HOSPITAL_BASED_OUTPATIENT_CLINIC_OR_DEPARTMENT_OTHER): Payer: Self-pay | Admitting: Pain Medicine

## 2019-03-02 ENCOUNTER — Encounter (HOSPITAL_BASED_OUTPATIENT_CLINIC_OR_DEPARTMENT_OTHER): Payer: Self-pay | Admitting: Pain Medicine

## 2019-03-02 NOTE — Telephone Encounter (Signed)
Left voicemail for patient to call us back to schedule in Dr. Francesca Jewett next available in person.

## 2019-03-06 ENCOUNTER — Telehealth (INDEPENDENT_AMBULATORY_CARE_PROVIDER_SITE_OTHER): Payer: Self-pay | Admitting: Internal Medicine

## 2019-03-06 ENCOUNTER — Ambulatory Visit (INDEPENDENT_AMBULATORY_CARE_PROVIDER_SITE_OTHER): Payer: No Typology Code available for payment source | Admitting: Dermatology

## 2019-03-06 NOTE — Telephone Encounter (Signed)
Received call from patient stating he made his initial acupuncture appointment but wanted to verify that his referral went through. Let patient know we will submit his referral to [facility] and return his call to let him know if authorized. Patient verbalized understanding.

## 2019-03-06 NOTE — Telephone Encounter (Signed)
Per [facility] patient's acupuncture benefits are-Acupuncture/ Chiropractic services are carved out to   [place] at  [phone]    [facility] needs to call insurance to obtain benefits and notify patient.

## 2019-03-10 ENCOUNTER — Encounter (HOSPITAL_BASED_OUTPATIENT_CLINIC_OR_DEPARTMENT_OTHER): Payer: Self-pay

## 2019-03-17 ENCOUNTER — Other Ambulatory Visit: Payer: Self-pay

## 2019-03-19 NOTE — Telephone Encounter (Signed)
Called [facility] but was unable to obtain acupuncture benefits because patients address through [employer] and [place] did not match.  Patient has been advised and will be reaching out to [facility] to resolve issue and obtain benefits.

## 2019-03-20 ENCOUNTER — Ambulatory Visit
Payer: No Typology Code available for payment source | Attending: Physical Medicine and Rehab | Admitting: Physical Medicine and Rehab

## 2019-03-20 ENCOUNTER — Other Ambulatory Visit: Payer: Self-pay

## 2019-03-20 ENCOUNTER — Encounter (HOSPITAL_BASED_OUTPATIENT_CLINIC_OR_DEPARTMENT_OTHER): Payer: Self-pay | Admitting: Pain Medicine

## 2019-03-20 DIAGNOSIS — M9901 Segmental and somatic dysfunction of cervical region: Secondary | ICD-10-CM | POA: Insufficient documentation

## 2019-03-20 DIAGNOSIS — M9902 Segmental and somatic dysfunction of thoracic region: Secondary | ICD-10-CM | POA: Insufficient documentation

## 2019-03-20 DIAGNOSIS — M7918 Myalgia, other site: Secondary | ICD-10-CM

## 2019-03-20 DIAGNOSIS — M546 Pain in thoracic spine: Secondary | ICD-10-CM

## 2019-03-20 DIAGNOSIS — G8929 Other chronic pain: Secondary | ICD-10-CM | POA: Insufficient documentation

## 2019-03-20 DIAGNOSIS — M9908 Segmental and somatic dysfunction of rib cage: Secondary | ICD-10-CM | POA: Insufficient documentation

## 2019-03-20 DIAGNOSIS — M961 Postlaminectomy syndrome, not elsewhere classified: Secondary | ICD-10-CM | POA: Insufficient documentation

## 2019-03-20 DIAGNOSIS — M9907 Segmental and somatic dysfunction of upper extremity: Secondary | ICD-10-CM | POA: Insufficient documentation

## 2019-03-20 DIAGNOSIS — M9900 Segmental and somatic dysfunction of head region: Secondary | ICD-10-CM | POA: Insufficient documentation

## 2019-03-20 NOTE — Progress Notes (Signed)
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 They were referred for possible osteopathic manual medicine. 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  They have tried PT, and acupuncture.  He also reports decreased sensation and proprioception from the chest down    Location:  Thoracic (points to the bilateral upper and mid thoracic)  Radiation:  None  Severity:   (severe)        History:    Past Medical History:   Diagnosis Date   . Acne    . Back pain     secondary to herniated disc, from back injury in 2000   . Hyperhidrosis     propanolol tx   . Hyperlipidemia     LDL > 200, TG ~400 at Dx, statin intolerant.  Per pt, r/o for familial HDL   . Parasomnia associated with sexual behavior     Tx with Caprice Renshaw, has been being downtitrated     Past Surgical History:   Procedure Laterality Date   . l3/4 laminectomy  2014    @ [facility], 2014   . epidural steroid injection      x5 ESIs, most ecent 2017     Social History     Socioeconomic History   . Marital status: Married     Spouse name: Not on file   . Number of children: Not on file   . Years of education: Not on file   . Highest education level: Not on file   Occupational History   . Not on file   Social Needs   . Financial resource strain: Not on file   . Food insecurity     Worry: Not on file     Inability: Not on file   . Transportation needs     Medical: Not on file     Non-medical: Not on file   Tobacco Use   . Smoking status: Never Smoker   . Smokeless tobacco: Never Used   Substance and Sexual Activity   . Alcohol use: Yes     Comment: 1-3 drinks infrequently   . Drug use: Not on file   . Sexual activity: Not on file   Lifestyle   . Physical activity     Days per week: Not on file     Minutes per session: Not on file   . Stress: Not on file   Relationships   . Social Product manager on  phone: Not on file     Gets together: Not on file     Attends religious service: Not on file     Active member of club or organization: Not on file     Attends meetings of clubs or organizations: Not on file     Relationship status: Not on file   . Intimate partner violence     Fear of current or ex partner: Not on file     Emotionally abused: Not on file     Physically abused: Not on file     Forced sexual activity: Not on file   Other Topics Concern   . Not on file   Social History Narrative    PhD in Psychology, works in [employer], [place] [employer]     Current Outpatient  Medications on File Prior to Visit   Medication Sig Dispense Refill   . acetaZOLAMIDE (DIAMOX) 250 MG tablet Take 1 tablet (250 mg) by mouth 3 times daily. 90 tablet 1   . Alirocumab (PRALUENT) 150 MG/ML SOAJ Inject 75 mg under the skin every 14 days. 1 Box of pens 11   . clonazePAM (KLONOPIN) 0.5 MG tablet Take 1 tablet (0.5 mg) by mouth at bedtime. 30 tablet 0   . desonide (DESOWEN) 0.05 % cream Apply 1 Application topically 2 times daily. apply to affected areas 15 g 1   . gabapentin (NEURONTIN) 300 MG capsule 1-2 caps nightly as needed for pain/sleep 60 capsule 11   . meloxicam (MOBIC) 15 MG tablet Take 1 tablet (15 mg) by mouth at bedtime. 30 tablet 0   . propranolol (INDERAL LA) 60 MG CP24 Take 1 capsule (60 mg) by mouth daily. 90 capsule 3   . tizanidine (ZANAFLEX) 2 MG tablet 1-2 tabs nightly as needed for pain/sleep 60 tablet 11   . tretinoin (RETIN-A) 0.025 % cream Use a pea sized amount on the entire face nightly.  At first, you may use every other night to avoid dryness and irritation, and gradually increase use to nightly as tolerated 45 g 3     No current facility-administered medications on file prior to visit.      Allergies   Allergen Reactions   . Atorvastatin Other     Debilitating myalgias   . Pravastatin Other     Debilitating myalgias   . Rosuvastatin Other     Debilitating myalgias   . Zetia [Ezetimibe] Other     Debilitating  myalgias     Family History   Problem Relation Name Age of Onset   . Cancer Mother          cervical   . No Known Problems Father     . Cancer Maternal Grandmother          ovarian   . No Known Cancer Maternal Grandfather     . Heart Disease Maternal Grandfather     . Cancer Paternal Grandmother          pancreatic   . Cancer Paternal Grandfather          mesothelioma   . Cancer Brother  17        osteosarcoma       ROS:  Constitutional:  Negative   [id number]   GU: Negative   MSK: see HPI    Physical exam  Constitutional: Complexion is normal.  Appears to be healthy.  Alert and oriented x 3.    Head: atraumatic, normocephalic    Cardiovascular: No swelling in the extremities    Psychiatric: Agitation appears to be absent. Anxiety appears to be absent. Depression appears to be absent. Judgment is intact and appropriate. Orientation to person, place and time is intact. Recent memory is intact. Remote memory is intact.    Respiratory: Breath sounds are normal. Breathing is not labored.    Skin: No scars or rashes in the extremities. +well healed scar in the upper mid thoracic and lower lumbar    Lymph: no lymphedema     Musculoskeletal System    Gait and station:  Ambulated with out assistive device.  Gait is normal  Range of motion: full range cervical flexion/extension SB/Rotation, no crepitation, no contracture   Stability:  No dislocation, no subluxation,  no step off deformity of spinous process  Inspection/palpation -  +  misalignment and asymmetry (see osteopathic exam), tenderness bilateral paravertebral muscles in the upper and mid thoracic   Inspection: normal cervical lordosis, normal thoracic kyphosis, normal lumbar lordosis  Muscle strength and tone - No flaccid limbs, no cog wheel rigidity, no spasticity, no atrophy no dystonic abnormal movements      Osteopathic exam  Head:    OA flexed   Cervical:   MFR R>L   Ribs:  MFR R<L (upper on right)  Thoracic:  Paravertebral muscles hypertrophic  R>L  Lumbar: N/a  Pelvic/Hip: N/a  Sacrum/SI: N/a  UE:  Tenderpoint Rhomboids  5; levator scapulae 4  LE:  N/a  Abdomen: N/a     Imaging:  MRI 10/14/2018 s/p laminectomy from T2-T5 no abnormal cord signal, no abnormal fluid collection    Assesment:    Erik Limbo, 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    Diagnosis/Impression     1. Somatic Dysfunction of the areas listed above  2. Thoracic pain  3. Myofascial Dysfunction  4. S/p laminectomy syndrome      Commentary and Medical Decision Making:    Based on careful review of the history, physical examination and relavent imaging, I believe the patients pain complaints may be attributed in part to underlying somatic dysfunction as described above. Physical objective findings indicate changes in somatic complaints and medical decision was made during visit that OMT would be necessary due to such changes.    At this point the patient has not exhausted all conservative treatment options and may consider the following treatments.     Recommendations:    1. After discussion with the patient and consideration of all theraputic options, the patient decided to pursue osteopathic manipulative treatment.  Discussed Risks, Benefits, and Alternatives to procedure. Questions answered. Patient voiced understanding and wished to proceed. The risks include increase in pain.  The patient was consented for the procedure.   It was applied to the following areas: head, neck, thoracic spine, upper extremity, ribs.   There was a collaborative medical decision to use Osteopathic Manual Medicine; which was applied to 5 different body areas all interconnected with the complaint of pain.  The patient was treated with myofascial release techniques, counterstrain techniques.  They were treated in a supine position in a spine neutral posture.  Pt tolerated this well with no apparent side effects, there was no increase in pain.  2. I personally reviewed the above images which show  post surgical changes without cord signal change  3. The patient can follow up with this clinic as needed.

## 2019-03-22 ENCOUNTER — Encounter (HOSPITAL_BASED_OUTPATIENT_CLINIC_OR_DEPARTMENT_OTHER): Payer: Self-pay | Admitting: Pain Medicine

## 2019-03-22 DIAGNOSIS — G894 Chronic pain syndrome: Secondary | ICD-10-CM

## 2019-03-22 DIAGNOSIS — G4700 Insomnia, unspecified: Secondary | ICD-10-CM

## 2019-03-22 MED ORDER — GABAPENTIN 300 MG OR CAPS
ORAL_CAPSULE | ORAL | 11 refills | Status: AC
Start: 2019-03-22 — End: ?

## 2019-03-22 NOTE — Progress Notes (Signed)
Hi Dr. Thereasa Parkin, hope all is well with you. I'm seeing some other [facility] providers to address upper back pain associated with an event that occurred during my dural tear surgery (some hypotheses are spinal cord contusion, impact of anesthetics on the brain, etc.). Good news is that I haven't had the CSF Leak sxs since surgery.     Dr. Iva Lento at [facility] recommended I try gabapentin 300mg  3x per day for now (but not long term). I'm doing 600mg  1x per day, so that would inc by 300mg . Do you mind modifying the prescription to reflect this?     Vaughan Basta and I have been texting, here's her text:     Start on Gabapentin 300 3x per day, this is the spinal cord waking up      Thanks!  [name]      Encounter Diagnoses   Name Primary?   . Insomnia, unspecified type    . Chronic pain syndrome        Diagnoses and all orders for this visit:    Insomnia, unspecified type  -     gabapentin (NEURONTIN) 300 MG capsule; 1-2 caps three times a day    Chronic pain syndrome  -     gabapentin (NEURONTIN) 300 MG capsule; 1-2 caps three times a day

## 2019-03-26 ENCOUNTER — Other Ambulatory Visit: Payer: Self-pay

## 2019-03-26 ENCOUNTER — Telehealth (INDEPENDENT_AMBULATORY_CARE_PROVIDER_SITE_OTHER): Payer: Self-pay | Admitting: Dermatology

## 2019-03-26 NOTE — Telephone Encounter (Signed)
FYI- Pt. Called to confirm appointment with Dr. Deliah Boston for 03/27/19. Pt confirmed that he would like to continue care with Dr. 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 Request to switch MD send by C. Lejeck.

## 2019-03-26 NOTE — Telephone Encounter (Signed)
Patient is requesting to switch provider.    Previous MD: Dr. Olam Idler    Reason for switch: Availability issue on Fridays.    Requesting approval from Dr. Deliah Boston    I apologize for late request. Patient is scheduled tomorrow. Please let us know if patient is ok to keep his appt tomorrow or if I need to reschedule.      Thank you!

## 2019-03-27 ENCOUNTER — Ambulatory Visit (INDEPENDENT_AMBULATORY_CARE_PROVIDER_SITE_OTHER): Payer: No Typology Code available for payment source | Admitting: Dermatology

## 2019-03-27 DIAGNOSIS — D485 Neoplasm of uncertain behavior of skin: Secondary | ICD-10-CM

## 2019-03-27 DIAGNOSIS — L989 Disorder of the skin and subcutaneous tissue, unspecified: Secondary | ICD-10-CM

## 2019-03-27 DIAGNOSIS — D229 Melanocytic nevi, unspecified: Secondary | ICD-10-CM

## 2019-03-27 DIAGNOSIS — D1801 Hemangioma of skin and subcutaneous tissue: Secondary | ICD-10-CM

## 2019-03-27 NOTE — Progress Notes (Signed)
Return patient exam    HISTORY:  Erik Burke is a 38 year old male with no history of skin cancer, does have a history of dysplastic nevi who presents for evaluation of skin changes given history of UV exposure, sun damage, last seen 2019. Had AK biopsied left upper eyelid margin. He notes new moles that erupted on right side of back in last year. Asymptomatic and he is unsure if they are changing because he had back surgery. Denies any other lesions of concern.        Social History:   - grew up in [place], [employer], undergrad at [employer]  - Investment banker, operational, Biomedical engineer in [place]    Past Medical History:  - The patient's dermatologic medical history is negative for melanoma, negative for NMSC  - The patient's dermatologic family history is negative for melanoma, and negative for other skin disease.    - The medications were reviewed.  - Allergies: Atorvastatin; Pravastatin; Rosuvastatin; and Zetia [ezetimibe]    REVIEW OF SYSTEMS:  CONSTITUTIONAL: negative for fever or chills  DERMATOLOGIC: Feels well, no other skin complaints.    PHYSICAL EXAM:  Skin Type:                    2  General Appearance: within normal limits  Neuro: Alert and oriented x 3  Psych: Mood and affect within normal limits  Eyes: Inspection of lids and conjunctiva within normal limits  Cardiovascular: Inspection of peripheral vascular system showed no swelling, edema.   Extremities: inspection of digits and nails wnl    Skin: Scalp, face, neck, chest, abdomen, back, arms, legs, hands, feet, buttocks, groin, nails examined    Pink clustered homogenous papules and macules on right back. With rubbing one macule slightly urticates and some have ? Yellow center.   There are erythematous vascular papules on trunk and extremities.  Scattered hyperpigmented macules on trunk and extremities with even pigmentation  Remainder of exam without abnormalities        ASSESSMENT AND TREATMENT PLAN:  [name] was seen today for recheck.  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 After risks associated with this procedure including pain, bleeding, infection, scarring, recurrence, and need for further therapy were reviewed with patient, verbal consent was obtained for treatment. Time-out was then taken with two identifiers to verify the site and side of procedure as well as the patient identity. The area was then cleansed with alcohol prep and anesthetized with lidocaine with epinephrine. A total of 0.7 ml was used. Biopsy was performed without difficulty with Dermablade. Hemostasis was obtained with dry-sol. Minimal blood loss. Patient tolerated the procedure well. Vaseline and bandage were applied. Aftercare discussed. Physician confirmed specimen in bottle and labeled appropriately.    Multiple benign appearing nevi  Clinically benign appearing nevi - none concerning today  - ABCDE's of melanoma discussed - ugly duckling mole surveillance, self skin checks discussed    - Photoprotection discussed - The patient encouraged to use a broad sunscreen AND at least SPF 30, with reapplication every 2 hours, to apply at least 15 minutes prior to sun exposure.     Cherry angiomas  Reassured regarding benign appearance on today's exam- no need for treatment  Monitor for change, symptoms and return for eval if that occurs          - f/u 1 year  TBC or sooner as needed

## 2019-03-27 NOTE — Patient Instructions (Signed)
Post-Biopsy Care Instructions    You have had a biopsy taken of your skin.  Follow these instructions to care for your biopsy site.     Twice a day:     1. Clean the wound with soap and warm water.     2. Pat dry.     3. Apply Aquaphor or Vaseline.      4. Cover with a Band-aid.     Repeat these instructions for 7 days or until you return to our office.     *Remember:  Call the physician in 10 business days for the results of your biopsy.     Return in 1 year, sooner with any concerns

## 2019-03-29 ENCOUNTER — Encounter (INDEPENDENT_AMBULATORY_CARE_PROVIDER_SITE_OTHER): Payer: Self-pay | Admitting: Hospital

## 2019-03-31 ENCOUNTER — Ambulatory Visit (INDEPENDENT_AMBULATORY_CARE_PROVIDER_SITE_OTHER): Payer: No Typology Code available for payment source | Admitting: Acupuncturist

## 2019-03-31 ENCOUNTER — Telehealth (HOSPITAL_COMMUNITY): Payer: Self-pay | Admitting: Cardiovascular Disease

## 2019-03-31 DIAGNOSIS — Z789 Other specified health status: Secondary | ICD-10-CM

## 2019-03-31 DIAGNOSIS — E7800 Pure hypercholesterolemia, unspecified: Secondary | ICD-10-CM

## 2019-03-31 MED ORDER — PRALUENT 150 MG/ML SC SOAJ
75.0000 mg | SUBCUTANEOUS | 11 refills | Status: AC
Start: 2019-03-31 — End: ?

## 2019-03-31 NOTE — Telephone Encounter (Signed)
Arianna, could you please communicate to the patient as below. Thanks

## 2019-03-31 NOTE — Telephone Encounter (Signed)
-----   Message from Vanessa Durham, [facility] sent at 03/31/2019 12:53 PM PST -----  Hello Dr. Cherylann Ratel,     Unfortunately, we will no longer be able to service patient for his self injectable Praluent injections after December 2020. His next monthly refill will be sent out tomorrow, which will provide him doses through mid January 2021.     We will need to transfer out his rx to the contracted pharmacy with his insurance. I believe it is with [facility].   Please fax a new prescription to them at [phone] for his January 2021 dispense.     If you have any questions, please feel free to contact us at [phone].     Thank you!   Kerri Perches, Pharm.[facility] [phone]

## 2019-04-01 NOTE — Telephone Encounter (Signed)
No additional information needed.     Matter resolved

## 2019-04-01 NOTE — Telephone Encounter (Signed)
Patient returned LVN's call. Relayed message regarding new contracted pharmacy for [facility] refills. Please call patient back if any additional information needs to be relayed.

## 2019-04-01 NOTE — Telephone Encounter (Signed)
Contacted pt/ no answered, left msg to call back.     Need to make aware [facility] will no longer be able to provide Praluent medication for pt, since they will no longer be contracted with pt's insurance. Pt will still be able to received December's refill with [facility]    Starting jan 2021, pt's contracted pharmacy with his insurance will be with [facility].   Prescription has been sent to new pharmacy.

## 2019-04-05 ENCOUNTER — Other Ambulatory Visit: Payer: Self-pay

## 2019-04-06 ENCOUNTER — Encounter (INDEPENDENT_AMBULATORY_CARE_PROVIDER_SITE_OTHER): Payer: Self-pay | Admitting: Dermatology

## 2019-04-06 ENCOUNTER — Ambulatory Visit (INDEPENDENT_AMBULATORY_CARE_PROVIDER_SITE_OTHER): Payer: No Typology Code available for payment source | Admitting: Acupuncturist

## 2019-04-06 DIAGNOSIS — G8929 Other chronic pain: Secondary | ICD-10-CM

## 2019-04-06 DIAGNOSIS — M546 Pain in thoracic spine: Secondary | ICD-10-CM

## 2019-04-06 DIAGNOSIS — S24101A Unspecified injury at T1 level of thoracic spinal cord, initial encounter: Secondary | ICD-10-CM

## 2019-04-06 NOTE — Interdisciplinary (Signed)
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

## 2019-04-06 NOTE — Telephone Encounter (Signed)
PA request received from new pt's RX for Praluent injection. PA approved until 10/02/2019. Approval letter scanned under media on 04/06/19.    Contacted pt to make aware, provided pharmacy's phone number for any additional questions.   Pt expressed understanding and thank me for the call.     Matter resolved

## 2019-04-06 NOTE — Patient Instructions (Addendum)
1. Practice deep belly breathing at night before bed (relaxing low back and breathing from below umbilicus). You can prop your knees up with pillows to help relax your low back muscles.   2. Try lifting son in different way (like on couch and him climbing up to you, you standing from gluts/quads instead of lifting him up from upper back). If this makes the burning pain worse, stop.   3. Drink water after acupuncture and schedule 2 more visits for a total of 6 (ideally weekly).

## 2019-04-06 NOTE — Progress Notes (Signed)
[facility] Acupuncture Note    April 06, 2019    Patient: Erik Limbo, PhD  Age: 38 year old   Sex: male    Treatment Number: 1    Requesting Physician: Rutherford Nail*    Reason for Visit:   Chief Complaint   Patient presents with   . Back Pain     Patient is a 38 year old male experiencing upper back pain and burning in the T-2 to T-6 region as a result of surgery in March, 2020 for a dural tear. Patient had his T-1 and T-2 removed. Pain and burning occurs depending on how much lifting of his son that he's doing. Usually it happens around 5 times per day causing burning pain to radiate laterally from that region. This burning gets alleviated with laying flat for 10 minutes or longer. 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     Patient also notes abnormal sensation radiating out from his chest in the same T-2 to T-6 region, explains it feeling like glass. Notes a similar abnormal sensation in his upper abdomen radiating laterally from umbilicus to around 3 inches superior. Notes that the abdominal one has been moving lower and isn't as fixed as it once was.    Review of Systems   Appetite/Taste: Normal  Bowel Movements: Normal 1 per day formed, denies hematuria   Pain: See CC  Sleep: disregulated, takes a combination of cannabis, gabapentin to sleep, discontinued cannabis 2 nights    Feet: Notes increase in sensation coming back in his feet which feels like sand between his toes    Last treatment evaluation  Did last treatment help? N/A visit 1    Physical Exam  No abdominal pain  Numbness rectangular shape just distal to distal end of upper thoracic spinal scar to medial border of scapula    Plan  Spent 30 minutes of face to face time obtaining health history, answering questions, counseling, and performing exams. Spent an additional 15 minutes providing acupuncture  services.    Acupuncture 1x/week for 6 weeks  EMO: Reduce burning pain episodes to less than 2 per day, increase sleep to 8 hours per night uninterrupted, reduce glass sensation in chest by 50% and abdominal sensations by 50%    Prognosis: Guarded    [facility] acupuncture, patient had no questions

## 2019-04-08 ENCOUNTER — Encounter (INDEPENDENT_AMBULATORY_CARE_PROVIDER_SITE_OTHER): Payer: Self-pay | Admitting: Dermatology

## 2019-04-08 ENCOUNTER — Other Ambulatory Visit: Payer: Self-pay

## 2019-04-08 NOTE — Telephone Encounter (Addendum)
Please advise 

## 2019-04-09 ENCOUNTER — Encounter (HOSPITAL_BASED_OUTPATIENT_CLINIC_OR_DEPARTMENT_OTHER): Payer: Self-pay | Admitting: Hospital

## 2019-04-12 ENCOUNTER — Encounter (HOSPITAL_BASED_OUTPATIENT_CLINIC_OR_DEPARTMENT_OTHER): Payer: Self-pay | Admitting: Pain Medicine

## 2019-04-13 ENCOUNTER — Other Ambulatory Visit: Payer: Self-pay

## 2019-04-13 ENCOUNTER — Ambulatory Visit (INDEPENDENT_AMBULATORY_CARE_PROVIDER_SITE_OTHER): Payer: No Typology Code available for payment source | Admitting: Acupuncturist

## 2019-04-13 DIAGNOSIS — S24101A Unspecified injury at T1 level of thoracic spinal cord, initial encounter: Secondary | ICD-10-CM

## 2019-04-13 NOTE — Patient Instructions (Signed)
1. Call [phone] to schedule visits and check acupuncture coverage for your son  2. Drink water tonight   3. We can discuss this during the next visit but consider drinking mint tea (helpful for sensation of heat) and tumeric supplementation for inflammation and pain

## 2019-04-13 NOTE — Interdisciplinary (Signed)
Jiao upper 1/5 motor  [facility]

## 2019-04-13 NOTE — Progress Notes (Signed)
[facility] Acupuncture Note    April 13, 2019    Patient: Erik Limbo, PhD  Age: 38 year old   Sex: male    Treatment Number: 2    Requesting Physician: Rutherford Nail*    Reason for Visit:   Chief Complaint   Patient presents with   . Back Pain     Patient notes minimal changes, denies any adverse effects from acupuncture. Picking up son from seated position makes back pain 20 to 30%, able to recover after 30 seconds of stretching vs. picking up from standing position which causes 80% pain and takes 10 minutes of stretching to relieve. He notes that cumulatively though, picking up from seated position causes more pain at the end of the day. Not picking up his son allows him to go up to 8 hours without pain. No changes in glass feeling in chest or abdomen.     Review of Systems   Pain: See CC  Mental health: Patient notes subclinical depression and anxiety, more tense and tired at the end of the day    Last treatment evaluation  Did last treatment help? can't tell    Physical Exam  None    Plan  Performed 24 minutes of face to face time for acupuncture services included a 2nd set of needles (7 star) in thoracic region.

## 2019-04-14 ENCOUNTER — Encounter (HOSPITAL_BASED_OUTPATIENT_CLINIC_OR_DEPARTMENT_OTHER): Payer: Self-pay | Admitting: Hospital

## 2019-04-15 ENCOUNTER — Ambulatory Visit: Payer: No Typology Code available for payment source | Attending: Pain Medicine | Admitting: Pain Medicine

## 2019-04-15 DIAGNOSIS — S24101A Unspecified injury at T1 level of thoracic spinal cord, initial encounter: Secondary | ICD-10-CM

## 2019-04-15 DIAGNOSIS — M792 Neuralgia and neuritis, unspecified: Secondary | ICD-10-CM | POA: Insufficient documentation

## 2019-04-15 MED ORDER — DULOXETINE HCL 30 MG OR CPEP
ORAL_CAPSULE | ORAL | 11 refills | Status: DC
Start: 2019-04-15 — End: 2019-05-20

## 2019-04-15 NOTE — Patient Instructions (Signed)
Reasonable to trial duloxetine (start 20 or 30 and every 2 weeks can increase as needed and tolerated up to 60mg  daily). Venlafaxine (less evidence on dosing for pain) can trial via Dr. Selinda Eon or a psychiatrist. We also discussed possibly a TCA to help with both pain and sleep.

## 2019-04-15 NOTE — Progress Notes (Signed)
Pain Clinic Telemedicine MyChart Video Visit    ---------------------(data below generated by Erik Minium, MD)--------------------    Patient Verification & Telemedicine Consent:    Due to COVID-19 pandemic and a federally declared state of public health emergency, this service is being conducted via video.    I am proceeding with this evaluation at the direct request of the patient.  I have verified this is the correct patient and have obtained verbal consent and written consent from the patient/ surrogate to perform this voluntary telemedicine evaluation (including obtaining history, performing examination and reviewing data provided by the patient).   The patient/ surrogate has the right to refuse this evaluation.  I have explained risks (including potential loss of confidentiality), benefits, alternatives, and the potential need for subsequent face to face care. Patient/ surrogate understands that there is a risk of medical inaccuracies given that our recommendations will be made based on reported data (and we must therefore assume this information is accurate).  Knowing that there is a risk that this information is not reported accurately, and that the telemedicine video, audio, or data feed may be incomplete, the patient agrees to proceed with evaluation and holds [place] harmless knowing these risks. In this evaluation, we will be providing recommendations only.  The ultimate decision to follow, or not follow, these recommendations will be left to the bedside treating/ requesting practitioner.  The patient/ surrogate has been notified that other healthcare professionals (including students, residents and Metallurgist) may be involved in this audio-video evaluation.   All laws concerning confidentiality and patient access to medical records and copies of medical records apply to telemedicine.  The patient/ surrogate has received the [place] Notice of Privacy Practices.  I have reviewed this above  verification and consent paragraph with the patient/ surrogate.  If the patient is not capacitated to understand the above, and no surrogate is available, since this is not an emergency evaluation, the visit will be rescheduled until such time that the patient can consent, or the surrogate is available to consent.    Demographics:   Medical Record #: [id number]   Date: April 15, 2019   Patient Name: Erik Limbo, PhD   DOB: 1980/09/16  Age: 38 year old  Sex: male  Location: Home address on file      Evaluator(s):   Erik Limbo, PhD was evaluated by me today.    Clinic Location:  [facility]  [address]  [place] [address]    PAIN CLINIC FOLLOW UP NOTE    Primary Care Physician Erik Burke    Chief Complaint: Back Pain      SUBJECTIVE:  This is a 38 year old male who  has a past medical history of Acne, Back pain, Hyperhidrosis, Hyperlipidemia, and Parasomnia associated with sexual behavior. and  has a past surgical history that includes epidural steroid injection and l3/4 laminectomy (2014). presenting for Back Pain    Pt messages me:  "Hi Dr. Thereasa Burke,    [facility] all is well. Below is a snippet from one of our prior correspondence. I'm still experiencing pain following back surgery (they're not sure what the exact nature of the complication was that occurred during surgery but happy to fill you in). It's interfering with ADL's. Gabapentin doesn't seem to be doing the trick. Over time, I've tried PT and OT, as well as increasing/decreasing physical activity, rest, stretching, etc. in addition to CBT related  strategies such as deep breathing, mindfulness. I'm currently pursuing osteopathic manipulation (difficult to get consistent appointments) and acupuncture, and have a message into PT to get their thoughts. The pain is lasting much longer than anyone anticipated and I wanted to ask if it would be okay with you if I tried venlafaxine. I'm open to  any dosage you'd recommend.    I'm currently taking Praluent consistently as prescribed. I'm on relatively low doses of gabapentin (for pain) and tizanidine (for sleep secondary to pain), happy to stop both. Cannabis was working well at night (for pain) but recently went off. Thank you, and also happy to schedule an appointment (I'm ok with telehealth).     Best,  Erik Burke    "As I mentioned, gabapentinoids (gabapentin and pregabalin), SNRIs (duloxetine and venlafaxine), and TCAs (amitriptyline and nortriptyline) could all be helpful for both your head symptoms and your sacral/coccygeal/leg symptoms. "  "  The patient reports 06/2018 dural leak surgery c/b spinal cord contusion (vs anesthetic complication) with no sensation or proprioception below a thoracic level. Strength was not affected. Since then has had substantial pain at the surgical site, unable to lift his toddler because of pain in thoracic region. Pulling and burning. Has been working with acupuncture and OMT.     Unable to do PT due to this severe pain.    L shoulder rides higher than R since the surgery.    Asks about SNRI "since also have some secondary irritability."    After the surgery couldn't fear hunger.    Feels "shards of glass" with touching at nipple line and diminished sensation line getting lower, previously at that level, now ?xiphoid. Now says sensation "about 75%" back. Around the surgical site, "I can't feel anything there but it hurts."    After the surgery it took a while to be able to lift his left leg up.    Says during surgery there was a moment where "body went off line" (when asked about SSEPs he said "correct.")    Had to think every time he lifted his left leg and still needs to.    Having wife do deep massage in thoracic region doesn't hurt at all.    Asks about SNRIs- Hasn't tried duloxetine or venlafaxine. Talks to a therapist every so often, doesn't have a psychiatrist.    Had been on gabapentin 600mg  at nighttime and 4-6mg   of tizanidine and a dropper of cannabis and was sleeping well with that.    Now tapering off gabapentin, and off of tizanidine. Coming off of these, sleep hs been more interrupted. Trying to shift his sleep schedule to beat traffic with his upcoming next job at [employer]    Dr. Pearline Cables had titrated gabapentin up to 300mg  TID. After surgery had used higher dose and "wasn't able to focus."     Past Medical History:   Diagnosis Date   . Acne    . Back pain     secondary to herniated disc, from back injury in 2000   . Hyperhidrosis     propanolol tx   . Hyperlipidemia     LDL > 200, TG ~400 at Dx, statin intolerant.  Per pt, r/o for familial HDL   . Parasomnia associated with sexual behavior     Tx with Caprice Renshaw, has been being downtitrated     Past Surgical History:   Procedure Laterality Date   . l3/4 laminectomy  2014    @ [place] [facility], 2014   .  epidural steroid injection      x5 ESIs, most ecent 2017     Current Outpatient Medications   Medication Sig Dispense Refill   . acetaZOLAMIDE (DIAMOX) 250 MG tablet Take 1 tablet (250 mg) by mouth 3 times daily. 90 tablet 1   . Alirocumab (PRALUENT) 150 MG/ML SOAJ Inject 75 mg under the skin every 14 days. 1 Box of pens 11   . clonazePAM (KLONOPIN) 0.5 MG tablet Take 1 tablet (0.5 mg) by mouth at bedtime. 30 tablet 0   . desonide (DESOWEN) 0.05 % cream Apply 1 Application topically 2 times daily. apply to affected areas 15 g 1   . DULoxetine (CYMBALTA) 30 MG CR capsule Weeks 1 and 2: 1 cap daily: Weeks 3 and on: 2 caps daily 60 capsule 11   . gabapentin (NEURONTIN) 300 MG capsule 1-2 caps three times a day 180 capsule 11   . meloxicam (MOBIC) 15 MG tablet Take 1 tablet (15 mg) by mouth at bedtime. 30 tablet 0   . propranolol (INDERAL LA) 60 MG CP24 Take 1 capsule (60 mg) by mouth daily. 90 capsule 3   . tizanidine (ZANAFLEX) 2 MG tablet 1-2 tabs nightly as needed for pain/sleep 60 tablet 11   . tretinoin (RETIN-A) 0.025 % cream Use a pea sized amount on the entire face nightly.  At first,  you may use every other night to avoid dryness and irritation, and gradually increase use to nightly as tolerated 45 g 3     No current facility-administered medications for this visit.      Allergies   Allergen Reactions   . Atorvastatin Other     Debilitating myalgias   . Pravastatin Other     Debilitating myalgias   . Rosuvastatin Other     Debilitating myalgias   . Zetia [Ezetimibe] Other     Debilitating myalgias     Social History     Socioeconomic History   . Marital status: Married     Spouse name: Not on file   . Number of children: Not on file   . Years of education: Not on file   . Highest education level: Not on file   Occupational History   . Not on file   Social Needs   . Financial resource strain: Not on file   . Food insecurity     Worry: Not on file     Inability: Not on file   . Transportation needs     Medical: Not on file     Non-medical: Not on file   Tobacco Use   . Smoking status: Never Smoker   . Smokeless tobacco: Never Used   Substance and Sexual Activity   . Alcohol use: Yes     Comment: 1-3 drinks infrequently   . Drug use: Not on file   . Sexual activity: Not on file   Lifestyle   . Physical activity     Days per week: Not on file     Minutes per session: Not on file   . Stress: Not on file   Relationships   . Social Product manager on phone: Not on file     Gets together: Not on file     Attends religious service: Not on file     Active member of club or organization: Not on file     Attends meetings of clubs or organizations: Not on file     Relationship status: Not  on file   . Intimate partner violence     Fear of current or ex partner: Not on file     Emotionally abused: Not on file     Physically abused: Not on file     Forced sexual activity: Not on file   Other Topics Concern   . Not on file   Social History Narrative    PhD in Psychology, works in [employer], [place] [employer]     Family History   Problem Relation Name Age of Onset   . Cancer Mother          cervical   . No Known  Problems Father     . Cancer Maternal Grandmother          ovarian   . No Known Cancer Maternal Grandfather     . Heart Disease Maternal Grandfather     . Cancer Paternal Grandmother          pancreatic   . Cancer Paternal Grandfather          mesothelioma   . Cancer Brother  17        osteosarcoma       Review of Systems:  General: no fever  Respiratory: no cough  Remainder as her HPI    Physical Exam:   Vitals: Not obtained given Video Visit  Constitutional: Well-developed, well-nourished, and no distress  Psych: alert, oriented. Speech is clear/ normal. Affect is depressed.  Eyes: Sclera white, conjunctiva clear, lids are without lag. Pupils equal, not pinpoint.  ENT: Oropharynx clear and moist without erythema. Gums pink, good dentition. .  CV:  No lower extremity edema.  Respiratory:  Breathing easily without tachypnea or bradypnea. Not using accessory muscles.  GI/Abdomen: Non-distended.  Skin: Skin color, texture, turgor normal. No rashes or lesions.  Musculoskeletal:  Patient identified tender locations identified by patient and shared with examiner: allodynia from about T4  Neuro:  MS: Awake, alert, fluent speech, memory intact  CN: II-XII grossly intact  Motor: Grossly intact throughout BUE  Coord: No gross truncal or appendicular ataxia  Sens: Says 75% to light touch from about xiphoid process on down  Gait: Seated    Assessment and Plan:  Encounter Diagnoses   Name Primary?   . Spinal cord injury at T1-T6 level (CMS-HCC) Yes   . Neuropathic pain        This is a 38 year old male who  has a past medical history of Acne, Back pain, Hyperhidrosis, Hyperlipidemia, and Parasomnia associated with sexual behavior. and  has a past surgical history that includes epidural steroid injection and l3/4 laminectomy (2014). who presents for Back Pain    Pt with by history sounds to have had spinal cord injury predominantly affecting dorsal columns with marked proprioceptive and sensory loss and less so motor (but  reports some left leg motor deficit as well), now with neuropathic pain in thoracic region, he contacts me to discuss med recs today, in particular SNRIs. He asks about venlafaxine, I said reasonable but evidence for this indication is more with duloxetine, discussed R/B/A including worsening of mood and SI, abdominal discomfort, activation or sedation, and other options including slowly titrating up gabapentin to therapeutic level or trialing pregabalin or TCAs. Joint decision today to start duloxetine, will start 30mg  and can increase after 2 weeks to 60mg , recommend 6 week trial, and plan for follow up with me in 4 weeks.    Celso was seen today for back  pain.    Diagnoses and all orders for this visit:    Spinal cord injury at T1-T6 level (CMS-HCC)  -     DULoxetine (CYMBALTA) 30 MG CR capsule; Weeks 1 and 2: 1 cap daily: Weeks 3 and on: 2 caps daily    Neuropathic pain  -     DULoxetine (CYMBALTA) 30 MG CR capsule; Weeks 1 and 2: 1 cap daily: Weeks 3 and on: 2 caps daily      Patient Instructions   Reasonable to trial duloxetine (start 20 or 30 and every 2 weeks can increase as needed and tolerated up to 60mg  daily). Venlafaxine (less evidence on dosing for pain) can trial via Dr. Selinda Eon or a psychiatrist. We also discussed possibly a TCA to help with both pain and sleep.    Follow-up: 1 month    Total duration of encounter spent in pre-visit (reviewing last visit and reviewing prior Epic notes), intra-visit (updating relevant history, creating a treatment plan and medical discussion with patient), and post-visit (note completion and placing of orders) excluding separately reportable services/procedures: 45 minutes.

## 2019-04-15 NOTE — Telephone Encounter (Signed)
Pt MyChart message-   Hi Dr. Thereasa Parkin, thanks for the appointment. As a follow up and just an Juluis Rainier, here was a message from [place] NP toward end of May. I ended up doing up to 3600 for about a week. However, sx presentation was somewhat different at that time.     "You can take gabapentin [phone] mg TID. If you're already on 300 mg TID, then you can go up by 300 mg such as 600/600/600 or 900/900/900 on a weekly basis. The maximum dosage is 3600 mg/day. If you're on it for more than a week, then we recommend you taper off but we can talk about it when we get there. If you take it for less than a week and its not helping at a higher dose, then you can just stop it."

## 2019-04-20 ENCOUNTER — Encounter (INDEPENDENT_AMBULATORY_CARE_PROVIDER_SITE_OTHER): Payer: Self-pay | Admitting: Internal Medicine

## 2019-04-20 ENCOUNTER — Ambulatory Visit (INDEPENDENT_AMBULATORY_CARE_PROVIDER_SITE_OTHER): Payer: No Typology Code available for payment source | Admitting: Acupuncturist

## 2019-04-20 ENCOUNTER — Telehealth (INDEPENDENT_AMBULATORY_CARE_PROVIDER_SITE_OTHER): Payer: Self-pay | Admitting: Neurological Surgery

## 2019-04-20 ENCOUNTER — Other Ambulatory Visit: Payer: Self-pay

## 2019-04-20 ENCOUNTER — Telehealth (HOSPITAL_BASED_OUTPATIENT_CLINIC_OR_DEPARTMENT_OTHER): Payer: Self-pay | Admitting: Pain Medicine

## 2019-04-20 ENCOUNTER — Encounter (HOSPITAL_BASED_OUTPATIENT_CLINIC_OR_DEPARTMENT_OTHER): Payer: Self-pay | Admitting: Pain Medicine

## 2019-04-20 DIAGNOSIS — S24101A Unspecified injury at T1 level of thoracic spinal cord, initial encounter: Secondary | ICD-10-CM

## 2019-04-20 DIAGNOSIS — M549 Dorsalgia, unspecified: Secondary | ICD-10-CM

## 2019-04-20 DIAGNOSIS — Z9889 Other specified postprocedural states: Secondary | ICD-10-CM

## 2019-04-20 NOTE — Progress Notes (Signed)
[facility] Acupuncture Note    April 20, 2019    Patient: Erik Limbo, PhD  Age: 39 year old   Sex: male    Treatment Number: 3    Requesting Physician: Rutherford Nail*    Reason for Visit:   Chief Complaint   Patient presents with   . Back Pain     Patient reports no changes in back pain, notes that it's worse not only with lifting his child, but also if he has to hold his child down when changing his diaper. Patient notes a good sensation after the last acupuncture treatment using 7-star, as this area feels a bit more sensation.     Patient off all supplements and medications for past 6 days due to the recent start of an SNRI. Notes mild symptoms and side effects from the new medication.     Review of Systems   Pain: See CC, notes that he also has a tight band sensation and numbness around both knees and a tight sensation like an exercise band wrapped around both knees as well as a similar sensation in his perianal area. Notes chest burning and band is a bit better and that it's more localized in epigastric region.   Sleep: Disrupted, waking 8+ times a night, unable to get comfortable     Last treatment evaluation  Did last treatment help? can't tell    Physical Exam  None performed  Mild lateral purple bruising in lower thoracic region, patient denies injury or knowing about these markings    Plan  24 minutes of face to face acupuncture services which included a second set of needles inserted in left GB-21, BL-13 bilaterally, and SI-12 bilaterally.

## 2019-04-20 NOTE — Telephone Encounter (Signed)
From: [name], PhD  To: [name], MD  Sent: 04/20/2019 8:46 AM PST  Subject: 20-Other    Hi Dr. Selinda Eon, happy new year. Do you mind putting in a referral for PT eval (I'm planning on seeing Samul Dada) for post-surgery back pain. Happy to touch base if you'd like. Thank you! -[name]

## 2019-04-20 NOTE — Telephone Encounter (Signed)
Called pt and spoke with him. Informed pt that his PT order was signed by Dr. Selinda Eon and he could call PT.   Pt voiced understanding.

## 2019-04-20 NOTE — Telephone Encounter (Signed)
Pt is requesting a new order for b/l upper back TPI, please advise.     Last Visit: 04/15/19

## 2019-04-20 NOTE — Interdisciplinary (Signed)
7 star around upper thoracic incision

## 2019-04-20 NOTE — Telephone Encounter (Signed)
Patient is scheduled for in-clinic appointment.    Appointment scheduled for 05/11/19    Do you have or have you had in the last 24 hours:            Fever:  N           New cough (not chronic) : N           Shortness of breath: N  *Refer patient to PCP if YES for any flu like symptoms           2. Have you knowingly been exposed to anyone having any, some or all of the symptoms listed above?  N     3. Have you traveled  outside of the [place] in the last 14 days? Marland Kitchen  N     If Yes to any of these, schedule and or reschedule existing appointment 14 + days out     4.    Are you experiencing any new urgent Neurological symptoms? N  If Yes to # 4, provide symptoms below using .sccsymptoms smart phrase and route to site specific triage.    If no to # 4, document encounter and close.  Do not route.    Did you inform patient of no visitor policy Y    Will the patient require a visitor? N    If Yes: document visitor's full name in appointment notes.

## 2019-04-20 NOTE — Patient Instructions (Signed)
Here's the formula that we'll discuss tomorrow to help with sleep: https://www.amazon.com/Zizyphus-Seven-Forests-Tablets-Each/dp/B00GBFQ22I/ref=sr_1_1?dchild=1&keywords=zizyphus+18+seven+forests&qid=978-143-4441&s=hpc&sr=1-1

## 2019-04-21 ENCOUNTER — Ambulatory Visit (INDEPENDENT_AMBULATORY_CARE_PROVIDER_SITE_OTHER): Payer: No Typology Code available for payment source | Admitting: Acupuncturist

## 2019-04-21 DIAGNOSIS — S24101A Unspecified injury at T1 level of thoracic spinal cord, initial encounter: Secondary | ICD-10-CM

## 2019-04-21 NOTE — Interdisciplinary (Signed)
2nd set    SI-12   BL-13  L glut insertion X3 ashi

## 2019-04-21 NOTE — Patient Instructions (Addendum)
1. Purchase herbal formula and follow dosage recommendations on the bottle: https://www.amazon.com/Zizyphus-Seven-Forests-Tablets-Each/dp/B00GBFQ22I/ref=sr_1_1?dchild=1&keywords=[email or web address]+seven+forests&qid=(419)247-7548&s=hpc&sr=1-1  2. Pay attention to sleep over next 48 hours

## 2019-04-21 NOTE — Progress Notes (Signed)
.  cim  [facility] Acupuncture Note    April 21, 2019    Patient: [name], PhD  Age: 39 year old   Sex: male    Treatment Number: 4    Requesting Physician: Rutherford Nail*    Reason for Visit:   Chief Complaint   Patient presents with   . Back Pain     Patient notes more sensation in upper thoracic region since last night and more sensation in chest (causing him to need to press more deeply to elicit the sharp sensation). Sensation has moved from chest and is more focused in epigastric region. Notes less pain today due to lifting his son less. Went on a 45 minute walk without pain.    Discussed herbal supplementation to help with sleep.     Review of Systems   Pain:See CC, no change in band-like sensation in knees or sensation in feet, notes low back injury sometimes causes tension and he has a sensation of sitting on rocks  Sleep: Notes that sleep improved a bit last night, but still irregular    Last treatment evaluation  Did last treatment help? some    Physical Exam  None performed      Plan  24 minutes of face to face acupuncture services which included a second set of needles inserted in lumbar region as well as upper thoracics (left glut insertion ashi X4, BL-13, SI-12, and GB-21, BL-30). Applied heat lamp to lumbar spine for 15 minutes to increase local blood circulation. Patient noted feeling good after the heat lamp therapy.

## 2019-04-22 ENCOUNTER — Ambulatory Visit (INDEPENDENT_AMBULATORY_CARE_PROVIDER_SITE_OTHER): Payer: No Typology Code available for payment source | Admitting: Acupuncture

## 2019-04-23 ENCOUNTER — Encounter (HOSPITAL_BASED_OUTPATIENT_CLINIC_OR_DEPARTMENT_OTHER): Payer: Self-pay | Admitting: Pain Medicine

## 2019-04-23 ENCOUNTER — Telehealth (INDEPENDENT_AMBULATORY_CARE_PROVIDER_SITE_OTHER): Payer: Self-pay | Admitting: Internal Medicine

## 2019-04-23 ENCOUNTER — Telehealth (HOSPITAL_BASED_OUTPATIENT_CLINIC_OR_DEPARTMENT_OTHER): Payer: Self-pay

## 2019-04-23 NOTE — Telephone Encounter (Signed)
Patient called to check status on therapy referral. Patient was advised of standard processing time of 5-7 days. I informed that they would be contacted once authorization is received and assured them that they may contact us to check status at Call Center phone number: [phone]    Referral placed: 04/20/2019  Internal     Pt states he is post op and needs to be soon, pt is asking if possible to process request asap, thank you.

## 2019-04-23 NOTE — Telephone Encounter (Signed)
Patient called to follow up on previous message, he was informed it was re-ordered and Josem Kaufmann was submitted to insurance on 04/22/19.

## 2019-04-23 NOTE — Telephone Encounter (Signed)
Received call from patient stating he had his appointment with Ivar Drape and she stated she is willing to work with her son who is under his same insurance but is a primary care patient at [facility]. Let patient know I would send a formal message to management to get a formal okay.     Patient verbalized understanding.

## 2019-04-25 ENCOUNTER — Encounter: Payer: Self-pay | Admitting: Hospital

## 2019-04-27 ENCOUNTER — Encounter (INDEPENDENT_AMBULATORY_CARE_PROVIDER_SITE_OTHER): Payer: Self-pay | Admitting: Hospital

## 2019-04-27 NOTE — Telephone Encounter (Signed)
I was not able to verify health plan and ACU benefits.

## 2019-04-28 ENCOUNTER — Encounter (INDEPENDENT_AMBULATORY_CARE_PROVIDER_SITE_OTHER): Payer: Self-pay | Admitting: Hospital

## 2019-04-28 ENCOUNTER — Ambulatory Visit (INDEPENDENT_AMBULATORY_CARE_PROVIDER_SITE_OTHER): Payer: Self-pay | Admitting: Acupuncturist

## 2019-04-28 ENCOUNTER — Encounter (HOSPITAL_BASED_OUTPATIENT_CLINIC_OR_DEPARTMENT_OTHER): Payer: Self-pay | Admitting: Pain Medicine

## 2019-04-29 ENCOUNTER — Ambulatory Visit (HOSPITAL_BASED_OUTPATIENT_CLINIC_OR_DEPARTMENT_OTHER): Payer: No Typology Code available for payment source | Admitting: Pain Medicine

## 2019-04-29 NOTE — Telephone Encounter (Signed)
From: [name], PhD  To: Sharol Harness, [name]  Sent: 04/28/2019 5:18 PM PST  Subject: 20-Other    Wendie Simmer,  I just received notification today that [place] supposedly terminated the contract with [facility] (or vice versa), and I was auto-assigned to a [facility], so it sounds like I'm temporarily no longer covered under [employer]. I'm very actively trying to get this sorted out so I can continue my appointments. Thanks for your understanding!  -[name]

## 2019-04-30 ENCOUNTER — Encounter (INDEPENDENT_AMBULATORY_CARE_PROVIDER_SITE_OTHER): Payer: Self-pay | Admitting: Public Health & General Preventive Medicine

## 2019-04-30 ENCOUNTER — Telehealth (INDEPENDENT_AMBULATORY_CARE_PROVIDER_SITE_OTHER): Payer: Self-pay | Admitting: Neurological Surgery

## 2019-04-30 ENCOUNTER — Telehealth (INDEPENDENT_AMBULATORY_CARE_PROVIDER_SITE_OTHER): Admitting: Public Health & General Preventive Medicine

## 2019-04-30 ENCOUNTER — Telehealth (HOSPITAL_BASED_OUTPATIENT_CLINIC_OR_DEPARTMENT_OTHER): Payer: Self-pay | Admitting: Pain Medicine

## 2019-04-30 NOTE — Telephone Encounter (Signed)
Acupuncture benefits obtained and patient informed.

## 2019-04-30 NOTE — Progress Notes (Signed)
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 Referral/authorization        Visit completed through synchronous interactive audio-visual via pocket doc due to COVD 19 pandemic. Patient consented to Telehealth visit this way.   Time In:     1:30 pm  Time Out:  2:15 pm    Subjective:  1. 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  2. Surgery involved multiple level laminectomy and suturing the derma. Surgery done in 06/2018 by Dr. Barbie Banner, [facility], neurosurgeon. Only neurosurgeon who can perform that type of surgery.   3. 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 Has regained much function but limited left leg mobility, severe burning in upper back, and from chest down limited sensation.Symptoms are currently being addressed with aforementioned providers through medication, physical and occupational therapy, CBT and relaxation, and ESI  4. On cymbalta for pain with great relief. Has been able to stop gabapentin, mobic, and zanaflex as a result (but still may need occasionally as prn medications)   5. [facility]) dropped [place] 04/16/2019. Patient was not informed in advance. This puts in jeopardy specialized care patient receiving from providers; care that has enabled him ongoing steady improvements in his partial paralysis from chest down and related symptoms that resulted from complications from surgery as described above.    [employer] provides 30 days  continuity of care patient meets criteria for given complexity of neurological issues and would like to be given. Wants at minimum approval for January 2021 appointments with and for the following:    Most important to him is [place] [facility] Dr. Margarito Liner, for [facility] with CT guidance, scheduled on 05/11/2019. CT guidance required to avoid getting another tear when providing ESI. Dr. Nathen May is only interventional radiologist patient is aware of that does this procedure with CT guidance.    Neurosurgeon, Dr. Barbie Banner, [facility] (in collaboration with NP Raelyn Mora) for brain scan. He performed highly specialized surgery suturing dural tear in 06/2018, less than one year ago, and patient is due and has appointment for brain scan follow-up.    [employer], Acupuncturist with integrative medicine at [place], patient has acupuncture treatments every 2 weeks. Unusual to find an acupuncturist skilled in addressing patient's symptoms.   Dr. Ezequiel Essex, pain management, at [place]  6. Hyperhidrosis on propanolol.   7. Non rem sleep, has been taking klonopin, as needed, but hardly ever takes.       ROS  General - no fevers, chills, weakness, fatigue, or unintended weight loss  Ear/Nose/Throat -no sore throats, sinus congestion, ear pain, sudden change in vision or headaches  Lungs - no shortness of breath, cough, wheeze, or difficulty breathing.   Heart - no chest pain, abnormal heart rhythm, or leg swelling  GI - no nausea/vomiting, GERD, abdominal pain, diarrhea, constipation, or bloody stools   Urinary-  no difficulty with flow or hematuria  MusculoSkeletal - no new joint pain or stiffness  Skin - no rash or concerning skin  lesions   Endocrine - no change in heat or cold tolerance  Neurology - no  Lightheadedness or vertigo  Psychiatric - no anxiety, depression, or insomnia    Vitals  There were no vitals taken for this visit.  Physical Exam  Gen: A&O, NAD, pleasant, not ill  appearing  HEENT:  Eyes bilaterally - conjunctiva non-injected. No runny nose, sneezing, coughing, or hoarse voice  LUNGS:  No use of accessory muscles when breathing.   NEURO: Mentation normal. Speech normal. No tremor.   PSYCH:  Normal affect, eye contact, judgement and insight  SKIN: No skin rash visualized.     *Telemedicine appointment. Physical exam limited.    Allergies:  Allergies   Allergen Reactions   . Atorvastatin Other     Debilitating myalgias   . Pravastatin Other     Debilitating myalgias   . Rosuvastatin Other     Debilitating myalgias   . Zetia [Ezetimibe] Other     Debilitating myalgias       Medications  Current Outpatient Medications   Medication Sig   . Alirocumab (PRALUENT) 150 MG/ML SOAJ Inject 75 mg under the skin every 14 days.   . clonazePAM (KLONOPIN) 0.5 MG tablet Take 1 tablet (0.5 mg) by mouth at bedtime.   Marland Kitchen desonide (DESOWEN) 0.05 % cream Apply 1 Application topically 2 times daily. apply to affected areas   . DULoxetine (CYMBALTA) 30 MG CR capsule Weeks 1 and 2: 1 cap daily: Weeks 3 and on: 2 caps daily   . gabapentin (NEURONTIN) 300 MG capsule 1-2 caps three times a day   . meloxicam (MOBIC) 15 MG tablet Take 1 tablet (15 mg) by mouth at bedtime.   . propranolol (INDERAL LA) 60 MG CP24 Take 1 capsule (60 mg) by mouth daily.   . tizanidine (ZANAFLEX) 2 MG tablet 1-2 tabs nightly as needed for pain/sleep   . tretinoin (RETIN-A) 0.025 % cream Use a pea sized amount on the entire face nightly.  At first, you may use every other night to avoid dryness and irritation, and gradually increase use to nightly as tolerated     No current facility-administered medications for this visit.        Assessment and Plan  Diagnoses and all orders for this visit:    Dural tear  Comments:  Required suturing 06/2018. Has post-op brain scan schedule this month with his neurosurgeon; requesting continuity of care to  keep this appointment.   Orders:  -     Neurosurgery    Hyperhidrosis  Comments:  Stable.  Continue propanolol as prescribed.     Abnormal REM sleep  Comments:  Stable. Does not have REM sleep - follow up next visit    Contusion of cervical nerve root, sequela  -     Other Clinic  -     Acupuncture Clinic  -     Physical Therapy - Outside (Non-[place])    Impaired mobility  -     Other Clinic  -     Acupuncture Clinic  -     Physical Therapy - Outside (Non-[place])    Neuralgia  -     Acupuncture Clinic  -     Physical Therapy - Outside (Non-[place])    Loss of sensation  -     Acupuncture Clinic  -     Physical Therapy - Outside (Non-[place])        Return as needed.    Lacie Draft, MD  Barriers to Learning assessed: none. Patient verbalizes understanding of teaching and instructions and is agreeable to above plan.

## 2019-04-30 NOTE — Telephone Encounter (Signed)
CallerBonnita Nasuti  Phone # [phone]  Relationship to patient: [employer]  Notes:     Bonnita Nasuti is calling requesting a copy of pt's LOV w/Dr. Leodis Liverpool and states notes are needed for insurance approval purposes.     Bonnita Nasuti w/[employer]  [phone]  [phone]      Caller has been advised of 72 hr turnaround time.

## 2019-04-30 NOTE — Telephone Encounter (Signed)
Bonnita Nasuti from [facility] is requesting notes from 04/15/19 faxed to her please.    Phone: [phone]  Fax: [phone]

## 2019-04-30 NOTE — Telephone Encounter (Signed)
Returned Helen's call.    Clinic notes from [facility] 01/06/18   And Procedure report from [place] 01/17/18 from  [facility] notes from [facility] 05/12/18 from [place]

## 2019-05-01 ENCOUNTER — Encounter: Payer: Self-pay | Admitting: Hospital

## 2019-05-01 ENCOUNTER — Encounter (INDEPENDENT_AMBULATORY_CARE_PROVIDER_SITE_OTHER): Payer: Self-pay | Admitting: Neurological Surgery

## 2019-05-01 ENCOUNTER — Encounter (INDEPENDENT_AMBULATORY_CARE_PROVIDER_SITE_OTHER): Payer: Self-pay | Admitting: Public Health & General Preventive Medicine

## 2019-05-01 ENCOUNTER — Telehealth (HOSPITAL_BASED_OUTPATIENT_CLINIC_OR_DEPARTMENT_OTHER): Payer: Self-pay | Admitting: Pain Medicine

## 2019-05-01 NOTE — Telephone Encounter (Signed)
Pt's MyChart message routed to Shelbie Hutching NP and Dr.Pannell.

## 2019-05-01 NOTE — Telephone Encounter (Signed)
helen from [employer] calling to request pts after care summary visit from 09.21.2020  Please fax to [phone].

## 2019-05-01 NOTE — Telephone Encounter (Signed)
Bonnita Nasuti from [facility] is following up on request for notes from 04/15/19 to be faxed to her please.    Phone: [phone]  Fax: [phone]

## 2019-05-02 ENCOUNTER — Encounter (INDEPENDENT_AMBULATORY_CARE_PROVIDER_SITE_OTHER): Payer: Self-pay | Admitting: Public Health & General Preventive Medicine

## 2019-05-03 NOTE — Telephone Encounter (Signed)
[employer] transition of care and continuity of care application complete and faxed [id number].

## 2019-05-04 ENCOUNTER — Encounter (INDEPENDENT_AMBULATORY_CARE_PROVIDER_SITE_OTHER): Payer: Self-pay | Admitting: Public Health & General Preventive Medicine

## 2019-05-04 DIAGNOSIS — G9689 Other specified disorders of central nervous system: Secondary | ICD-10-CM

## 2019-05-04 DIAGNOSIS — S14109D Unspecified injury at unspecified level of cervical spinal cord, subsequent encounter: Secondary | ICD-10-CM

## 2019-05-04 DIAGNOSIS — G9611 Dural tear: Secondary | ICD-10-CM

## 2019-05-04 NOTE — Telephone Encounter (Signed)
From: Sherlie Ban, PhD  To: Lynnea Ferrier, MD  Sent: 05/04/2019 9:55 AM PST  Subject: referrals    Ok, I'll send them a message right now. The brain MRI sounds right but the CT myelogram is a pre-surgery diagnostic procedure to detect location and presence of csf leaks, so I wouldn't have that done unless I have sxs of additional leaks, which I don't at this time. I'll check in with them. Thank you       ----- Message -----   Erik Durie, MD   Sent:05/04/2019 8:16 AM PST   JY:NWGNFAO Erik Simmonds, PhD   Subject:RE: referrals    I spoke with referrals and referral request to neurosurgery still pending. Person I spoke with said no changes needed in referral.    I could put order for imaging in myself so you could have that done, so please check with neurosurgeon what imaging he would want. What I see in past in MRI of brain without and with contrast and thoracic CT myelogram with intrathecal contrast. However you have had other similar but different imaging done so would be helpful if you could check with your neurologist and I can get those insurance request for approval in.    Best,  Lacie Draft, MD      ----- Message -----   From:Demauri Erik Simmonds, PhD   Sent:05/01/2019 2:40 PM PST   ZH:YQMV Carmelia Bake, MD    Subject:RE: referrals    Hi Dr. Venetia Maxon,    Thank you for the message and VM. And for spending so much time on the phone and submitting those referrals. If the referrals are approved, that would be ideal. However, it sounds like that outcome could be less probable, especially within a timeframe that would allow be to continue my existing appointments.    I called [employer] again and asked to speak to a supervisor, and this time they stated that I could in fact file for a 30-day continuation of coverage for all [facility] providers, but they said that this could take up to 7 days to process.    In the meantime, what do you think about the following?:  -I Dr. Leanord Asal (or similar) at [facility] to get the  balling rolling on the ESI's within [facility]  -If the continuity of care is denied, then I'll transition to acupuncture, PT, and pain management with [facility]  -I will definitely need the [facility] referral for: follow-up visit with Dr. Arlyce Harman, brain MRI, thoracic MRI    I hope that is clear. Thanks gain for working with me on this. I think we have a good game plan, and once this the dust settles there will be more stability.    Have a great weekend,  Erik Burke      ----- Message -----   Erik Durie, MD   Sent:05/01/2019 11:19 AM PST   HQ:IONGEXB Erik Simmonds, PhD   Subject:referrals    Erik Burke,    Thank you for the information you provided me. Helpful that you are can concisely summarize your situation.    I spent over an hour on phone today trying to get guidance from [facility] but ultimately hit a dead end. I do not know who you have appeals filed with, because peer to peer department stated no denials.    Did you file for 30 day continuity of care that is provided? Does not necessarily give you anything but may provide more leverage if you have.  [employer] (your HMO) is closed every Friday. I submitted the 3 referrals you requested plus one for your neurosurgeon and put them all stat (which they can continue to deny you stat status) as required to prevent gains in therapy, etc, whatever was most appropriate for each one. I requested your current specialists (except physical therapy because did not know which provider that was) if possible and put reason why important.     Almost always [employer] requires you to see in-network physicians first. If those specialists state you need a higher level of care than they can provide, they submit referrals outside of network. [place] is a commonly used for out of network referral source, which may work to your benefit after first seeing in an in Risk manager.     You should be able to review my chart note from yesterday now.     Let me know any questions or requests at this  time.    Best,  Lacie Draft, MD

## 2019-05-05 ENCOUNTER — Ambulatory Visit (INDEPENDENT_AMBULATORY_CARE_PROVIDER_SITE_OTHER): Payer: HMO | Admitting: Acupuncturist

## 2019-05-05 ENCOUNTER — Ambulatory Visit (HOSPITAL_BASED_OUTPATIENT_CLINIC_OR_DEPARTMENT_OTHER): Payer: No Typology Code available for payment source | Admitting: Physical Medicine and Rehab

## 2019-05-06 ENCOUNTER — Encounter (HOSPITAL_BASED_OUTPATIENT_CLINIC_OR_DEPARTMENT_OTHER): Payer: Self-pay | Admitting: Physical Medicine and Rehab

## 2019-05-06 ENCOUNTER — Encounter (INDEPENDENT_AMBULATORY_CARE_PROVIDER_SITE_OTHER): Payer: Self-pay | Admitting: Public Health & General Preventive Medicine

## 2019-05-06 DIAGNOSIS — R200 Anesthesia of skin: Secondary | ICD-10-CM

## 2019-05-06 DIAGNOSIS — G9602 Spinal cerebrospinal fluid leak, spontaneous: Secondary | ICD-10-CM

## 2019-05-06 DIAGNOSIS — S14109D Unspecified injury at unspecified level of cervical spinal cord, subsequent encounter: Secondary | ICD-10-CM

## 2019-05-06 DIAGNOSIS — S24101A Unspecified injury at T1 level of thoracic spinal cord, initial encounter: Secondary | ICD-10-CM

## 2019-05-06 DIAGNOSIS — M5416 Radiculopathy, lumbar region: Secondary | ICD-10-CM

## 2019-05-06 DIAGNOSIS — M792 Neuralgia and neuritis, unspecified: Secondary | ICD-10-CM

## 2019-05-06 DIAGNOSIS — G9611 Dural tear: Secondary | ICD-10-CM

## 2019-05-06 NOTE — Telephone Encounter (Signed)
From: Sherlie Ban, PhD  To: Erik Ferrier, MD  Sent: 05/06/2019 9:19 PM PST  Subject: referrals    Hi Dr. Venetia Maxon,    See attached orders for MRIs, which were provided to me by [facility].    Haven't heard about approval for Dr. Nathen May appointment on Monday; I can call [employer] and follow up tomorrow, if you think that would be helpful.    Thanks,  Erik Burke      ----- Message -----   Wilhemena Durie, MD   Sent:05/04/2019 8:16 AM PST   JY:NWGNFAO Erik Simmonds, PhD   Subject:RE: referrals    I spoke with referrals and referral request to neurosurgery still pending. Person I spoke with said no changes needed in referral.    I could put order for imaging in myself so you could have that done, so please check with neurosurgeon what imaging he would want. What I see in past in MRI of brain without and with contrast and thoracic CT myelogram with intrathecal contrast. However you have had other similar but different imaging done so would be helpful if you could check with your neurologist and I can get those insurance request for approval in.    Best,  Lacie Draft, MD      ----- Message -----   From:Erik Erik Simmonds, PhD   Sent:05/01/2019 2:40 PM PST   ZH:YQMV Carmelia Bake, MD   Subject:RE: referrals    Hi Dr. Venetia Maxon,    Thank you for the message and VM. And for spending so much time on the phone and submitting those referrals. If the referrals are approved, that would be ideal. However, it sounds like that outcome could be less probable, especially within a timeframe that would allow be to continue my existing appointments.    I called [employer] again and asked to speak to a supervisor, and this time they stated that I could in fact file for a 30-day continuation of coverage for all  Health providers, but they said that this could take up to 7 days to process.    In the meantime, what do you think about the following?:  -I Dr. Leanord Asal (or similar) at [facility] to get the balling rolling on the ESI's within [facility]  -If the  continuity of care is denied, then I'll transition to acupuncture, PT, and pain management with [facility]  -I will definitely need the [facility] referral for: follow-up visit with Dr. Arlyce Harman, brain MRI, thoracic MRI    I hope that is clear. Thanks gain for working with me on this. I think we have a good game plan, and once this the dust settles there will be more stability.    Have a great weekend,  Erik      ----- Message -----   Wilhemena Durie, MD   Sent:05/01/2019 11:19 AM PST   HQ:IONGEXB Erik Simmonds, PhD   Subject:referrals    Cleone Slim Molli Hazard,    Thank you for the information you provided me. Helpful that you are can concisely summarize your situation.    I spent over an hour on phone today trying to get guidance from [facility] but ultimately hit a dead end. I do not know who you have appeals filed with, because peer to peer department stated no denials.    Did you file for 30 day continuity of care that is provided? Does not necessarily give you anything but may provide more leverage if you have.    [employer] (your HMO) is  closed every Friday. I submitted the 3 referrals you requested plus one for your neurosurgeon and put them all stat (which they can continue to deny you stat status) as required to prevent gains in therapy, etc, whatever was most appropriate for each one. I requested your current specialists (except physical therapy because did not know which provider that was) if possible and put reason why important.     Almost always [employer] requires you to see in-network physicians first. If those specialists state you need a higher level of care than they can provide, they submit referrals outside of network. [place] is a commonly used for out of network referral source, which may work to your benefit after first seeing in an in Risk manager.     You should be able to review my chart note from yesterday now.     Let me know any questions or requests at this time.    Best,  Lacie Draft, MD

## 2019-05-06 NOTE — Telephone Encounter (Signed)
Faxed progress notes from 04/15/19 to [place] at [facility] to [phone].

## 2019-05-06 NOTE — Telephone Encounter (Signed)
I just submitted MRI thoracic but ordering on Epic did not allow me  To put with 3d imaging. I put it in the top line for reason for imaging but want to make sure referral department does not miss. Please phone or add message so this does not get missed. Patient on MyCart sent attachment of exact imaging requiring follow-up.    Thank you!

## 2019-05-07 NOTE — Telephone Encounter (Signed)
Referrals are aware with request.

## 2019-05-08 ENCOUNTER — Encounter (INDEPENDENT_AMBULATORY_CARE_PROVIDER_SITE_OTHER): Payer: Self-pay | Admitting: Public Health & General Preventive Medicine

## 2019-05-08 ENCOUNTER — Telehealth (INDEPENDENT_AMBULATORY_CARE_PROVIDER_SITE_OTHER): Payer: Self-pay | Admitting: Neurological Surgery

## 2019-05-08 NOTE — Telephone Encounter (Signed)
appt still on the books for 05/11/19 with Dr.Pannell

## 2019-05-08 NOTE — Telephone Encounter (Signed)
Who is calling: Incoming call from patient    Reason for this call: Pt is  Calling to speak with AA. Agent unable to reach [place] via phone. Continuity of care letter might get approved. Insurance advised pt not to cancel appt. He should have an answer by today or Monday  Morning at the latest. Pt will be keeping appt for now.     Last office visit: Visit date not found  Next office visit:  05/11/2019    Action required by office: Please contact caller    Best way to contact:  [phone]    Caller has been advised this message will be transmitted to office and can expect a response within 72 business hours

## 2019-05-08 NOTE — Progress Notes (Deleted)
Attending MD:   Dr. Mart Piggs Pannell    Follow-up Visit:     Erik Burke is a  39 year old year-old male who is following up for CSF leak.    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  Patient endorses history of dural tear/CSF leak, treated with glue, blood patching and ESI in ~ 2010.  Patient was doing well until approximately one year ago.    Recent Treatment History  12/02/2017: Caudal epidural steroid injection w/[facility]  10/01/2017: Bilateralsphenopalatine ganglion nerve block w/ Thereasa Parkin  10/04/2016: Ganglion impar saccrococcxygeal ligament nerve block w/ [place]    Today Erik Burke notes orbital pressure like sensation over the last year, which is worse with sitting and improved with lying flat (on the side) or standing/walking.  The orbital pressure is correlated with increased sacral pain.  Patient notes symptoms also worse with ETOH intake.  Denies further numbness, tingling and weakness to all extremities.    Erik Burke has consulted with providers at [facility] and Diamox therapy was initiated - 250mg  qD.  Patient would like to increase this dose and will follow up with [facility] provider.       Surgical History:   Erik Burke  has a past surgical history that includes epidural steroid injection and l3/4 laminectomy (2014).     Pat Medical History:   Past Medical History:   Diagnosis Date   . Acne    . Back pain     secondary to herniated disc, from back injury in 2000   . Hyperhidrosis     propanolol tx   . Hyperlipidemia     LDL > 200, TG ~400 at Dx, statin intolerant.  Per pt, r/o for familial HDL   . Parasomnia associated with sexual  behavior     Tx with Caprice Renshaw, has been being downtitrated       Current Medications:     .  Alirocumab (PRALUENT) 150 MG/ML SOAJ, Inject 75 mg under the skin every 14 days.    .  clonazePAM (KLONOPIN) 0.5 MG tablet, Take 1 tablet (0.5 mg) by mouth at bedtime.    Marland Kitchen  desonide (DESOWEN) 0.05 % cream, Apply 1 Application topically 2 times daily. apply to affected areas    .  DULoxetine (CYMBALTA) 30 MG CR capsule, Weeks 1 and 2: 1 cap daily: Weeks 3 and on: 2 caps daily    .  gabapentin (NEURONTIN) 300 MG capsule, 1-2 caps three times a day    .  meloxicam (MOBIC) 15 MG tablet, Take 1 tablet (15 mg) by mouth at bedtime.    .  propranolol (INDERAL LA) 60 MG CP24, Take 1 capsule (60 mg) by mouth daily.    .  tizanidine (ZANAFLEX) 2 MG tablet, 1-2 tabs nightly as needed for pain/sleep    .  tretinoin (RETIN-A) 0.025 % cream, Use a pea sized amount on the entire face nightly.  At first, you may use every other night to avoid dryness and irritation, and gradually increase use to nightly as tolerated        Allergies:   Erik Burke  is allergic to atorvastatin; pravastatin; rosuvastatin; and zetia [ezetimibe].    Examination:   General: The patient is in no acute distress.   HEENT: Head is normocephalic, atraumatic, conjunctivae without icterus, oropharynx is patent without erythema or edema.   Neck: Supple, non tender c-spine, trachea is midline.   Extremities: No clubbing, cyanosis, or edema  Neurological examination:   Mental status: The patient is alert & oriented x3.   Intact recent and remote memory. Appropriate mood and affect.   Normal attention span and concentration.   Speech and language intact, no hoarseness or stridor, conversant without signs of aphasia or neglect. Good fund of knowledge.     Cranial Nerves: Pupils are equal, round and reactive to light and accommodation bilaterally, extraocular movements intact, no nystagmus noted, visual field intact to confrontation.  Face is symmetric, no ptosis noted  Hearing  intact to finger rub, tongue and uvula is midline  Tone normal, no tremor. No muscle atrophy noted  MOTOR STRENGTH                  Upper extremeties: RUE LUE Lower extremities: RLE LLE   Deltoid 5/5 5/5 Iliopsoas 5/5 5/5   Biceps 5/5 5/5 Quadriceps 5/5 5/5   Triceps 5/5 5/5 Biceps Femoris 5/5 5/5   Wrist extension 5/5 5/5            Hand grip 5/5 5/5 Tibialis anterior 5/5 5/5   PIP extension 5/5 5/5 Gastrocnemius 5/5 5/5   Intrinsics 5/5 5/5 EHL 5/5 5/5       DEEP TENDON REFLEXES                  Upper extremities: RUE LUE Lower extremities: RLE LLE   Biceps 2+ 2+ Patellar 2+ 2+   Triceps 2+ 2+ Ankle 2+ 2+   Brachioradialis 2+ 2+            Sensory intact throughout   Intact finger-to-nose   Romberg's and Pronator drift negative   Gait is normal    Radiographic Review:     Imaging reviewed with    Plan:  Erik Burke returns today as a follow-up for his      Reinforced signs and symptoms of neurologic/cognitive emergency and when to present to emergency department.    All questions answered during this visit.  Encouraged to call the office with worsening neurological symptoms, questions and/or concerns.     The supervising physician responsible for the care of this patient is Dr. Mart Piggs Pannell    Shelbie Hutching, MSN, FNP-C  Department of Neurosurgery  [place]  Email: [email or web address]

## 2019-05-08 NOTE — Telephone Encounter (Signed)
Progress notes faxed to [facility]. Received fax confirmation receipt. All matters resolved. Closing encounter.

## 2019-05-11 ENCOUNTER — Telehealth (INDEPENDENT_AMBULATORY_CARE_PROVIDER_SITE_OTHER): Payer: No Typology Code available for payment source | Admitting: Neurological Surgery

## 2019-05-11 ENCOUNTER — Other Ambulatory Visit (HOSPITAL_BASED_OUTPATIENT_CLINIC_OR_DEPARTMENT_OTHER): Payer: Self-pay | Admitting: Pain Medicine

## 2019-05-11 ENCOUNTER — Encounter (INDEPENDENT_AMBULATORY_CARE_PROVIDER_SITE_OTHER): Payer: Self-pay | Admitting: Neurological Surgery

## 2019-05-11 ENCOUNTER — Telehealth (INDEPENDENT_AMBULATORY_CARE_PROVIDER_SITE_OTHER): Payer: Self-pay | Admitting: Neurological Surgery

## 2019-05-11 DIAGNOSIS — S24101A Unspecified injury at T1 level of thoracic spinal cord, initial encounter: Secondary | ICD-10-CM

## 2019-05-11 DIAGNOSIS — M792 Neuralgia and neuritis, unspecified: Secondary | ICD-10-CM

## 2019-05-11 NOTE — Telephone Encounter (Signed)
CallerMinus Liberty  Phone # [phone]  Relationship to patient: [facility]  Notes:     Minus Liberty is kindly requesting to speak with clinic. States they are re-submitting to have pt's injection approved to [place] w/Dr. Leodis Liverpool however, she would like to obtain CPT codes needed to have procedure approved. Unable to reach AA at the time of call. Please advise.     Caller has been advised of 72 hr turnaround time.

## 2019-05-12 NOTE — Telephone Encounter (Signed)
[facility] called following up on previous message. Requesting CPT codes for insurance approval. Please advise

## 2019-05-13 ENCOUNTER — Encounter (INDEPENDENT_AMBULATORY_CARE_PROVIDER_SITE_OTHER): Payer: Self-pay | Admitting: Neurological Surgery

## 2019-05-13 ENCOUNTER — Encounter (INDEPENDENT_AMBULATORY_CARE_PROVIDER_SITE_OTHER): Payer: Self-pay | Admitting: Hospital

## 2019-05-13 ENCOUNTER — Ambulatory Visit (INDEPENDENT_AMBULATORY_CARE_PROVIDER_SITE_OTHER): Payer: No Typology Code available for payment source | Admitting: Acupuncturist

## 2019-05-13 NOTE — Telephone Encounter (Signed)
Message routed to Dr.Pannell as an [place]

## 2019-05-13 NOTE — Telephone Encounter (Signed)
Returned call, spoke to Juncal and provided her with CPT codes for  CT guided transforaminal epidural steroid injection.    (codes 62321, [id number], [place], [id number]) & [id number]

## 2019-05-14 ENCOUNTER — Encounter (INDEPENDENT_AMBULATORY_CARE_PROVIDER_SITE_OTHER): Payer: Self-pay | Admitting: Physical Medicine & Rehabilitation

## 2019-05-14 ENCOUNTER — Telehealth (INDEPENDENT_AMBULATORY_CARE_PROVIDER_SITE_OTHER): Payer: Self-pay | Admitting: Public Health & General Preventive Medicine

## 2019-05-14 ENCOUNTER — Encounter (INDEPENDENT_AMBULATORY_CARE_PROVIDER_SITE_OTHER): Payer: Self-pay

## 2019-05-14 ENCOUNTER — Telehealth (INDEPENDENT_AMBULATORY_CARE_PROVIDER_SITE_OTHER): Admitting: Physical Medicine & Rehabilitation

## 2019-05-14 NOTE — Progress Notes (Signed)
Patient Verification & Telemedicine Consent:    I am proceeding with this evaluation at the direct request of the patient.  I have verified this is the correct patient and have obtained verbal consent and written consent from the patient/ surrogate to perform this voluntary telemedicine evaluation (including obtaining history, performing examination and reviewing data provided by the patient).   The patient/surrogate has the right to refuse this evaluation.  I have explained risks (including potential loss of confidentiality), benefits, alternatives, and the potential need for subsequent face to face care. Patient/surrogate understands that there is a risk of medical inaccuracies given that our recommendations will be made based on reported data (and we must therefore assume this information is accurate).  Knowing that there is a risk that this information is not reported accurately, and that the telemedicine video, audio, or data feed may be incomplete, the patient agrees to proceed with evaluation and holds [place] harmless knowing these risks. All laws concerning confidentiality and patient access to medical records and copies of medical records apply to telemedicine.  The patient/surrogate has received [facility] Notice of Privacy Practices.  I have reviewed this above verification and consent paragraph with the patient/surrogate.  If the patient is not capacitated to understand the above, and no surrogate is available, since this is not an emergency evaluation, the visit will be rescheduled until such time that the patient can consent, or the surrogate is available to consent.    Demographics:  Medical Record #: [id number]   Date: May 14, 2019   Patient Name: Erik Ban, PhD   DOB: April 25, 1980  Age: 39 year old  Sex: male  Location: Home address on file      Evaluator(s):   Erik Ban, PhD was evaluated by me today.    Clinic Location:  [facility] [place]  [facility] [place]  [id number]  [place] [address]    INTERVENTIONAL PAIN MEDICINE NEW PATIENT EVALUATION    PRIMARY CARE PROVIDER:   Leeroy Bock or REFERRING PHYSICIAN:   Leda Quail, MD  [address]  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 Surgery done in 06/2018 by Dr. Barbie Banner, [facility], neurosurgeon. All symptoms from CSF leak resolved, but had partial paralysis from chest down, undergoing treatment since from [place] providers. Has regained much function but limited left leg mobility, severe burning in upper back, and from chest down limited sensation. On cymbalta for pain with great relief. Has been able to stop gabapentin, mobic, and zanaflex as a result.    Unfortunately insurance dropped [place] coverage. He was recommended a CT guided TFESI procedure from Dr Ralph Dowdy to avoid dural puncture at the level of the surgery - T1T2.     Duration: years  Location: mid upper back  Description: constant with intermittent flares; burning pain  Radiating symptoms: localized  Severity: 4/10  Aggravating factors: standing, walking, lifting, exercise  Alleviated by: sitting, lying  Associated symptoms: denies LE weakness, saddle anesthesia, or bowel/bladder incontinence    Current Outpatient Medications   Medication Sig Dispense Refill   . Alirocumab (PRALUENT) 150 MG/ML SOAJ Inject 75 mg under the skin every 14 days. 1 Box of pens 11   .  clonazePAM (KLONOPIN) 0.5 MG tablet Take 1 tablet (0.5 mg) by mouth at bedtime. 30 tablet 0   . DULoxetine (CYMBALTA) 30 MG CR capsule Weeks 1 and 2: 1 cap daily: Weeks 3 and on: 2 caps daily 60 capsule 11   . gabapentin (NEURONTIN) 300 MG capsule 1-2 caps three times a day 180 capsule 11   . propranolol (INDERAL [place]) 60 MG CP24 Take 1 capsule (60 mg) by mouth daily. 90 capsule 3   . tizanidine  (ZANAFLEX) 2 MG tablet 1-2 tabs nightly as needed for pain/sleep 60 tablet 11   . tretinoin (RETIN-A) 0.025 % cream Use a pea sized amount on the entire face nightly.  At first, you may use every other night to avoid dryness and irritation, and gradually increase use to nightly as tolerated 45 g 3     No current facility-administered medications for this visit.        Allergies   Allergen Reactions   . Atorvastatin Other     Debilitating myalgias   . Pravastatin Other     Debilitating myalgias   . Rosuvastatin Other     Debilitating myalgias   . Zetia [Ezetimibe] Other     Debilitating myalgias       =========    Past Medical History:   Diagnosis Date   . Acne    . Back pain     secondary to herniated disc, from back injury in 2000   . Hyperhidrosis     propanolol tx   . Hyperlipidemia     LDL > 200, TG ~400 at Dx, statin intolerant.  Per pt, r/o for familial HDL   . Parasomnia associated with sexual behavior     Tx with Jaynie Bream, has been being downtitrated     Past Surgical History:   Procedure Laterality Date   . l3/4 laminectomy  2014    @ [facility], 2014   . epidural steroid injection      x5 ESIs, most ecent 2017     Social History     Socioeconomic History   . Marital status: Married     Spouse name: Not on file   . Number of children: Not on file   . Years of education: Not on file   . Highest education level: Not on file   Occupational History   . Not on file   Social Needs   . Financial resource strain: Not on file   . Food insecurity     Worry: Not on file     Inability: Not on file   . Transportation needs     Medical: Not on file     Non-medical: Not on file   Tobacco Use   . Smoking status: Never Smoker   . Smokeless tobacco: Never Used   Substance and Sexual Activity   . Alcohol use: Yes     Comment: 1-3 drinks infrequently   . Drug use: Not on file   . Sexual activity: Not on file   Lifestyle   . Physical activity     Days per week: Not on file     Minutes per session: Not on file   . Stress: Not on file     Relationships   . Social Wellsite geologist on phone: Not on file     Gets together: Not on file     Attends religious service: Not on file     Active member of club or  organization: Not on file     Attends meetings of clubs or organizations: Not on file     Relationship status: Not on file   . Intimate partner violence     Fear of current or ex partner: Not on file     Emotionally abused: Not on file     Physically abused: Not on file     Forced sexual activity: Not on file   Other Topics Concern   . 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 Cancer Mother          cervical   . No Known Problems Father     . Cancer Maternal Grandmother          ovarian   . No Known Cancer Maternal Grandfather     . Heart Disease Maternal Grandfather     . Cancer Paternal Grandmother          pancreatic   . Cancer Paternal Grandfather          mesothelioma   . Cancer Brother  17        osteosarcoma         REVIEW OF SYSTEMS:   =======================================================================  Comprehensive 12pt ROS is negative except as noted in HPI and scanned questionnaire    Physical Exam:   Vitals: There were no vitals taken for this visit.  Unable to perform    Imaging:  MRI THORACIC SPINE WO/W CONT AND 3D MYELOGRAM  10/14/2018 10:30 AM    CLINICAL INDICATION: History of back surgery thoracic laminectomy  for CSF leak repair 39 year old male    COMPARISON: 08/19/2018    TECHNICAL FACTORS: Sagittal T1, Sagittal T2, Axial T2,  Coronal T2  haste3D, Sagittal T1 post FS, 3D MIPs    IMAGING MEDICATIONS: gadobutroL (GADAVIST) 7.5 mmol/7.5 mL (1  mmol/mL) injection 7.5 mL    FINDINGS:      ALIGNMENT AND CURVATURE: No abnormality demonstrated.    OSSEOUS STRUCTURES/BONE MARROW: Laminectomy changes posteriorly at  the C7, T1, T2 level.    PARAVERTEBRAL SOFT TISSUES: Mild posterior soft tissue swelling  slightly towards  the bursal aspect of the dural sac. Large  enhancement and signal change in the operative site posteriorly.  At the lower cervical upper thoracic spine. Prominent contrast  enhancement to the area is noted.     SPINAL CORD: Signal changes in the cord at the T1-2 level appears  smaller than on prior study of 08/19/2018. This could represent  myelomalacia posterior to the small syrinx which is decreased in  size.    INTERVERTEBRAL DISKS: Ventral small fluid collection at the T2-3 level similar to that seen on prior study the collection ventrally may represent small areas of loculated fluid within the spinal canal could represent small arachnoid cysts or webs or possibly CSF leak.    CSF LEAK: As above, the small loculated fluid collections at the T2-3 levels persists. There may be a small dorsiflexion at the same location.    MR MYELOGRAM: Small collection dorsal to the operative site could represent small hematoma or seroma.    IMPRESSION:  1.  postsurgical changes at T1-2 level with small fluid collection ventral and dorsal and subcutaneous tissues consistent with hematoma or seroma. Possibility of pseudomeningocele or CSF leak is present.  2.  Abnormal  signal intensity at the T1-2 level in the central cord appears smaller than on prior study of 08/19/2018         ASSESMENT:  =======================================================================  Erik Ban, PhD is a 39 year old male, w/ h/o mid back pain consistent with the following:      ICD-10-CM ICD-9-CM    1. Neuropathic pain  M79.2 729.2    2. Spinal cord injury at T1-T6 level (CMS-HCC)  S24.101A 952.10    3. CSF leak  G96.00 349.89          PLAN:  =======================================================================  1. Pharmacologic Management:  - Anti-Depressants: continue cymbalta 60mg  daily    2. Physical Therapy, other alternative treatments  - recommend PT eval treat for SCI following dural tear repair, focusing on ROM,  stretching/strengthening, mobility, increase endurance, home exercise program, DME eval if necessary, use of modalities such as ultrasound, massage, ice/heat as appropriate  - will also provide referral to chiropractor for manipulation therapy    3. Interventional Pain Management:  - Imaging: MRI T spine reviewed  - s/p T1T2 dural tear repair; possible CSF leak at T2T3 on   - Per Dr Ralph Dowdy; recommends CT guided TFESI at T1T2; will inquire [facility] interventional radiology to see if this can be performed   - if not, then can plan for a interlaminar T3T4 ESI     Thank you for allowing me to participate in the care of this patient.     Follow up: 2 weeks following procedure          Daisy Floro MD  Interventional Spine and Pain Medicine  Physical Medicine and Rehabilitation  [facility]  May 14, 2019       This was a visit of 60 minutes. Greater than 50% of the time was spent counseling the patient face-to-face regarding disease management.

## 2019-05-14 NOTE — Interdisciplinary (Signed)
I, Dhani Imel, have reviewed the patient's allergies and medications. // jh

## 2019-05-14 NOTE — Telephone Encounter (Signed)
Melina from [facility] called requesting a prior MRI report to clear pt for newly ordered imaging (04/2019) due to embolization coil implant.    Please fax to : [phone]  Best call back number is  [phone]

## 2019-05-14 NOTE — Progress Notes (Signed)
Discussed case with neurointerventionalist Dr Jerlyn Ly, will provide referral for a CT guided T1T2 TFESI.

## 2019-05-15 ENCOUNTER — Encounter (INDEPENDENT_AMBULATORY_CARE_PROVIDER_SITE_OTHER): Payer: Self-pay | Admitting: Hospital

## 2019-05-15 NOTE — Telephone Encounter (Signed)
From: [name], PhD  To: Sharol Harness, [name]  Sent: 05/15/2019 8:53 AM PST  Subject: 20-Other    Alfonso Ramus, finally got in touch with someone who was able to provide me a list of my new covered acupuncture providers. Unfortunately you are not listed with them (no [place] provider is). Below are providers closest to my home. I understand you're probably pretty busy.    If you have a minute, I was wondering if you'd recommend any specific type of credentials or background to look for in an acupuncturist? And/or do you recognize/would recommend any of the below providers?    Thanks again for all that you did. I'm bummed we were not able to finish services and also bring my son in to see you.    [name]. [place] [address]. [place]  Hall Busing. [place]. [place].  [name] and [name]. [address] [place]  [name]. [place].  [name]. [place] [address]. [phone].    Take care,  [name]

## 2019-05-17 NOTE — Telephone Encounter (Signed)
He has MRI reports under imaging. Please phone her and ask which one(s) she would like and please fax to her.    Thank you!

## 2019-05-18 NOTE — Telephone Encounter (Signed)
Called [facility]. They said pt told them that he has had MRI of his spine and brain done before so they are requesting most recent results of those exams.  Sent MRI results from 10/14/2018 and 08/19/2018 to [phone]

## 2019-05-19 ENCOUNTER — Ambulatory Visit (INDEPENDENT_AMBULATORY_CARE_PROVIDER_SITE_OTHER): Payer: Self-pay | Admitting: Acupuncturist

## 2019-05-19 ENCOUNTER — Ambulatory Visit (HOSPITAL_BASED_OUTPATIENT_CLINIC_OR_DEPARTMENT_OTHER): Payer: No Typology Code available for payment source | Admitting: Physical Medicine and Rehab

## 2019-05-19 NOTE — Telephone Encounter (Signed)
From: Sherlie Ban, PhD  To: Whitney Muse, MD  Sent: 05/14/2019 5:54 PM PST  Subject: 20-Other    Hi Dr. Oswaldo Milian, one more follow up-- I received a letter indicating I was approved for 12 appointments of PT, so sounds like I do not need that referral anymore. Thanks!

## 2019-05-19 NOTE — Telephone Encounter (Signed)
From: Sherlie Ban, PhD  To: Whitney Muse, MD  Sent: 05/14/2019 11:50 AM PST  Subject: 20-Other    Hi Dr. Oswaldo Milian, I realized I failed to mention during the appointment that I need a referral for office visit (1-year Burke-surgery appointment) with neurosurgeon Dr. Barbie Banner at [facility] in [place]. I had an appointment scheduled but now need a new referral following the contract termination between [place] and [employer]. The purpose is to track Burke-surgery recovery, including status of superficial siderosis, brain bleed, and thoracic edema. I already have brain and thoracic MRI orders approved in preparation for this follow-up visit.    Brief context: Dr. Lacie Draft originally submitted the referral order for Dr. Arlyce Harman; this was denied but they approved a visit to Dr. Electa Sniff; Dr. Electa Sniff then referred me to you.    Hope that helps. Let me know if I can provide further clarification or if I can do anything on my end. Thank you,  Erik [place]

## 2019-05-20 ENCOUNTER — Encounter (INDEPENDENT_AMBULATORY_CARE_PROVIDER_SITE_OTHER): Payer: Self-pay | Admitting: Physical Medicine & Rehabilitation

## 2019-05-20 MED ORDER — DULOXETINE HCL 30 MG OR CPEP
ORAL_CAPSULE | ORAL | 4 refills | Status: AC
Start: 2019-05-20 — End: ?

## 2019-05-20 NOTE — Addendum Note (Signed)
Addended byMardelle Matte on: 05/20/2019 08:43 AM     Modules accepted: Orders

## 2019-05-22 ENCOUNTER — Encounter (INDEPENDENT_AMBULATORY_CARE_PROVIDER_SITE_OTHER): Payer: Self-pay | Admitting: Hospital

## 2019-05-22 ENCOUNTER — Encounter (INDEPENDENT_AMBULATORY_CARE_PROVIDER_SITE_OTHER): Payer: Self-pay | Admitting: Physical Medicine & Rehabilitation

## 2019-05-22 NOTE — Telephone Encounter (Signed)
From: [name], PhD  To: Sharol Harness, [name]  Sent: 05/22/2019 12:46 PM PST  Subject: 20-Other    Erik Burke, no worries if you're not in a position to take a look at those providers but thought I'd check check in one more time before I go ahead and schedule. Have a great weekend!      ----- Message -----   From:Erik Burke, [name]   Sent:05/18/2019 4:59 PM PST   NE:[id number] [name], PhD   Subject:RE: 20-Other    Hi Erik Burke,     Thank you for letting me know. Wishing you the best of luck on your start to your new position. I'd be happy to review these acupuncturists that you listed and let you know who stands out. I'll try to get back to you either this evening or tomorrow.    Best,   [name], [name]      ----- Message -----   From:Erik [name], PhD   Sent:05/15/2019 8:53 AM PST   YD:[id number] Burke, [name]   Subject:20-Other    Hi [name], finally got in touch with someone who was able to provide me a list of my new covered acupuncture providers. Unfortunately you are not listed with them (no [place] provider is). Below are providers closest to my home. I understand you're probably pretty busy.    If you have a minute, I was wondering if you'd recommend any specific type of credentials or background to look for in an acupuncturist? And/or do you recognize/would recommend any of the below providers?    Thanks again for all that you did. I'm bummed we were not able to finish services and also bring my son in to see you.    [name]. [place] real. [place]  Hall Busing. [place]. [place].  [name] and [name]. [address] [place]  [name]. [place].  [name]. [place] [address]. [phone].    Take care,  Erik [place]

## 2019-05-24 ENCOUNTER — Encounter (INDEPENDENT_AMBULATORY_CARE_PROVIDER_SITE_OTHER): Payer: Self-pay | Admitting: Physical Medicine & Rehabilitation

## 2019-05-26 ENCOUNTER — Ambulatory Visit (INDEPENDENT_AMBULATORY_CARE_PROVIDER_SITE_OTHER): Payer: Self-pay | Admitting: Acupuncturist

## 2019-05-30 ENCOUNTER — Encounter (INDEPENDENT_AMBULATORY_CARE_PROVIDER_SITE_OTHER): Payer: Self-pay | Admitting: Public Health & General Preventive Medicine

## 2019-05-30 ENCOUNTER — Encounter (INDEPENDENT_AMBULATORY_CARE_PROVIDER_SITE_OTHER): Payer: Self-pay | Admitting: Internal Medicine

## 2019-05-30 DIAGNOSIS — M549 Dorsalgia, unspecified: Secondary | ICD-10-CM

## 2019-06-01 ENCOUNTER — Encounter (INDEPENDENT_AMBULATORY_CARE_PROVIDER_SITE_OTHER): Payer: Self-pay | Admitting: Physical Medicine & Rehabilitation

## 2019-06-02 ENCOUNTER — Ambulatory Visit (INDEPENDENT_AMBULATORY_CARE_PROVIDER_SITE_OTHER): Payer: Self-pay | Admitting: Acupuncturist

## 2019-06-02 ENCOUNTER — Encounter (INDEPENDENT_AMBULATORY_CARE_PROVIDER_SITE_OTHER): Payer: Self-pay | Admitting: Public Health & General Preventive Medicine

## 2019-06-03 ENCOUNTER — Telehealth (INDEPENDENT_AMBULATORY_CARE_PROVIDER_SITE_OTHER): Payer: Self-pay | Admitting: Public Health & General Preventive Medicine

## 2019-06-03 DIAGNOSIS — Z7409 Other reduced mobility: Secondary | ICD-10-CM

## 2019-06-03 DIAGNOSIS — S14109D Unspecified injury at unspecified level of cervical spinal cord, subsequent encounter: Secondary | ICD-10-CM

## 2019-06-03 DIAGNOSIS — S24101A Unspecified injury at T1 level of thoracic spinal cord, initial encounter: Secondary | ICD-10-CM

## 2019-06-04 ENCOUNTER — Encounter (INDEPENDENT_AMBULATORY_CARE_PROVIDER_SITE_OTHER): Payer: Self-pay | Admitting: Physical Medicine & Rehabilitation

## 2019-06-09 ENCOUNTER — Ambulatory Visit (INDEPENDENT_AMBULATORY_CARE_PROVIDER_SITE_OTHER): Payer: Self-pay | Admitting: Acupuncturist

## 2019-06-13 ENCOUNTER — Encounter (INDEPENDENT_AMBULATORY_CARE_PROVIDER_SITE_OTHER): Payer: Self-pay | Admitting: Public Health & General Preventive Medicine

## 2019-06-15 ENCOUNTER — Encounter (INDEPENDENT_AMBULATORY_CARE_PROVIDER_SITE_OTHER): Payer: Self-pay | Admitting: Physical Medicine & Rehabilitation

## 2019-06-15 ENCOUNTER — Encounter (INDEPENDENT_AMBULATORY_CARE_PROVIDER_SITE_OTHER): Payer: Self-pay | Admitting: Public Health & General Preventive Medicine

## 2019-06-16 ENCOUNTER — Encounter (HOSPITAL_COMMUNITY): Payer: Self-pay | Admitting: Cardiovascular Disease

## 2019-06-16 ENCOUNTER — Telehealth (INDEPENDENT_AMBULATORY_CARE_PROVIDER_SITE_OTHER): Payer: Self-pay | Admitting: Physical Medicine & Rehabilitation

## 2019-06-16 ENCOUNTER — Encounter (INDEPENDENT_AMBULATORY_CARE_PROVIDER_SITE_OTHER): Payer: Self-pay | Admitting: Public Health & General Preventive Medicine

## 2019-06-16 NOTE — Telephone Encounter (Signed)
Forwarding to MD for review/ Recommendations. June 16, 2019  Please advise

## 2019-06-16 NOTE — Telephone Encounter (Signed)
mychart    Thank you for your kind message, and I'm sad to see you leave. But there are couple great cardiologists I could recommend at [facility] - Dr. Vivien Presto, and Dr. Ali Lowe. I wish transitions were easier between insurances, besides just suggesting to have a full 3 month supply on hand before transition, there is not much else that will help with approval.     I am copying my clinic note below, so you could use that with new insurance, and pass on to the next doctor.    Good luck in your career!    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    He tried Lipitor, pravastatin, Zetia, and Crestor (unsure of Crestor dose).  Muscle weakness and pain with all of those medications, including Zetia alone was to the degree that he could not put clothes on, had to miss work.   He was tested negative for FH mutations.   Mentions that heart rate tends to run high, often in 90's. Had skipped beats couple years ago, none recently.  Below is the detailed summary of his trials of different statins, and prior lipid panel results:      Interval history:  Repatha was approved, and initially tolerated it well, however later started to experience pain in the area of sacrum and coccyx, extending down the hips. We spoke at the time, and although it did not appear to be a typical reaction to Praluent, decided to hold it for several months. There was no change in symptoms.  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   Has not been exercising due to concern of exacerbating the symptoms, but busy with his 108-monthold son.    Diagnostics (I reviewed  all the test results below today):  EKG 09/26/16 (interpreted by me):  NSR, HR84    Coronary [place] report:    ADDENDUM REPORT: 12/31/2013 12:36  CLINICAL DATA: Risk stratification  EXAM:  Coronary Calcium Score  TECHNIQUE:  The patient was scanned on a Siemens Sensation 16 slice scanner.  Axial non-contrast 3 mm slices were carried out through the heart.  The data set was analyzed on a dedicated work station and scored  using the AProsper  FINDINGS:  Non-cardiac: [facility].  Ascending Aorta: Normal size.  Pericardium: Normal  Coronary arteries: Originating in normal position.  IMPRESSION:  Coronary calcium score of 6. This was 879percentile for age and sex  matched control.    ROS:  Constitutional: negative.    Eyes: negative.    Ears, Nose, Mouth, Throat: negative.    CV: as in HPI.  Resp: negative.    GI: negative.    GU: negative.    Musculoskeletal:negative.  Integumentary: negative.    Neuro: negative.    Psych: negative  Endo: negative.    Heme/Lymphatic: negative.    Allergy/Immun: negative.     Past Medical / Surgical History:  Past Medical History:  Diagnosis Date  . Acne   . Back pain    secondary to  herniated disc, from back injury in 2000  . Hyperhidrosis    propanolol tx  . Hyperlipidemia    LDL > 200, TG ~400 at Dx, statin intolerant.  Per pt, r/o for familial HDL  . Parasomnia associated with sexual behavior    Tx with Caprice Renshaw, has been being downtitrated    Social History:  Social History    Socioeconomic History  . Marital status: Married    Spouse name: Not on file  . Number of children: Not on file  . Years of education: Not on file  . Highest education level: Not on file  Occupational History  . Not on file  Social Needs  . Financial resource strain: Not on file  . Food insecurity:    Worry: Not on file    Inability: Not on file  . Transportation needs:    Medical: Not on file    Non-medical: Not on file  Tobacco Use  . Smoking status: Never  Smoker  . Smokeless tobacco: Never Used  Substance and Sexual Activity  . Alcohol use: Yes    Comment: 1-3 drinks infrequently  . Drug use: Not on file  . Sexual activity: Not on file  Lifestyle  . Physical activity:    Days per week: Not on file    Minutes per session: Not on file  . Stress: Not on file  Relationships  . Social connections:    Talks on phone: Not on file    Gets together: Not on file    Attends religious service: Not on file    Active member of club or organization: Not on file    Attends meetings of clubs or organizations: Not on file    Relationship status: Not on file  . Intimate partner violence:    Fear of current or ex partner: Not on file    Emotionally abused: Not on file    Physically abused: Not on file    Forced sexual activity: Not on file  Other Topics Concern  . Not on file  Social History Narrative   PhD in Psychology, works in [employer], [place] [employer]    Family History:  Family History  Problem Relation Name Age of Onset  . Cancer Mother         cervical  . No Known Problems Father    . Cancer Maternal Grandmother         ovarian  . No Known Cancer Maternal Grandfather    . Heart Disease Maternal Grandfather    . Cancer Paternal Grandmother         pancreatic  . Cancer Paternal Grandfather         mesothelioma  . Cancer Brother  47       osteosarcoma  Maternal grandfather - HLD, ?heart disease diagnosed in his 14's, family does not know details  Mother - HLD  Father - HLD on statin    Medications (medication list reviewed by me with the patient):  Current Outpatient Medications  Medication Sig  . acetaZOLAMIDE (DIAMOX) 250 MG tablet Take 1 tablet (250 mg) by mouth 3 times daily.  Marland Kitchen alirocumab (PRALUENT) 75 MG/ML SOPN injection pen INJECT 1 ML (75 MG) UNDER THE SKIN EVERY 14 DAYS.  Marland Kitchen clonazePAM (KLONOPIN) 0.5 MG tablet Take 0.5 tablets (0.25 mg) by mouth at bedtime.  Marland Kitchen desonide (DESOWEN) 0.05 % cream Apply 1 Application topically 2 times daily. apply to affected areas  . propranolol  (INDERAL LA) 60 MG CP24  Take 1 capsule (60 mg) by mouth daily.  Marland Kitchen tretinoin (RETIN-A) 0.025 % cream Use a pea sized amount on the entire face nightly.  At first, you may use every other night to avoid dryness and irritation, and gradually increase use to nightly as tolerated    No current facility-administered medications for this visit.   propranolol for hyperhydrosis    Allergies:  Patient is allergic to atorvastatin; pravastatin; rosuvastatin; and zetia [ezetimibe].    Physical Exam:  BP 113/65 (BP Location: Left arm, BP Patient Position: Sitting, BP cuff size: Regular)   Pulse 89   Temp 98 F (36.7 C) (Oral)   Resp 16   Ht 5' 7.99" (1.727 m)   Wt 74.8 kg (165 lb)   SpO2 100%   BMI 25.09 kg/m  /    General: awake, alert, no acute distress  HEENT: NCAT, PERRL, EOMI  Neck: JVP ~7cm, no carotid bruits  Lungs: clear to auscultation bilaterally, no wheezing/crackles  Heart: RRR, nl S1S2, no murmurs, rubs or gallops  Abdomen: +BS, soft, non-tender, non-distended  Extremities: warm, well-perfused, no edema,  2+ symmetric distal pulses    Lab Data (reviewed by me today):  Lab Results  Component Value Date   BUN 14 07/15/2017   CREAT 1.04 07/15/2017   CL 108 (H) 02/14/2018   NA 139 02/14/2018   K 3.6 02/14/2018   [place] 9.7 07/15/2017   TBILI 0.54 07/15/2017   ALB 4.6 07/15/2017   TP 7.4 07/15/2017   AST 22 07/15/2017   ALK 64 07/15/2017   BICARB 19 (L) 02/14/2018   ALT 20 07/15/2017   GLU 91 07/15/2017    Lab Results  Component Value Date   WBC 4.4 02/11/2016   RBC 5.04 02/11/2016   HGB 15.5 02/11/2016   HCT 44.9 02/11/2016   MCV 89.1 02/11/2016   MCHC 34.5 02/11/2016   RDW 11.5 (L) 02/11/2016   PLT 245 02/11/2016   MPV 9.3 (L) 02/11/2016    Lab Results  Component Value Date   CHOL 163 07/15/2017   HDL 54 07/15/2017   LDLCALC 88 07/15/2017   TRIG 105 07/15/2017    Lab Results  Component Value Date   TSH 1.16 02/11/2016      Assessment/Plan:  *hyperlipidemia:   *statin intolerance:  *Coronary calcification (31  percentile for age and sex):  -attempted statin re-challenge, and the patient was only able to tolerate 2.5 of rosuvastatin QOD, unable to tolerate 5 mg QOD due to muscle aches. Inadequate control of LDL with 2.5 mg of rosuvastatin QOD in a young patient with very high [place] score.  Praluent was authorized, and tolerated it well, unlikely pain symptoms are related to PCSK9 inhibitor, therefore, OK to resume.     Of note, in the past the patient already tried Lipitor, pravastatin, Zetia,and all caused debilitating myalgias.   Continue dietary modifications.    Orders Placed This Encounter  Procedures  . Lipid Panel Green Plasma Separator Tube  . Comprehensive Metabolic Panel Green Plasma Separator Tube    There are no Patient Instructions on file for this visit.    Return in about 2 years (around 03/26/2020).      Charline Bills, MD    Assistant Clinical Professor of Medicine  Division of [place]  Pager [phone]  [email or web address]        ===View-only below this line===      ----- Message -----     From: Sheral Apley  T[place]lett, PhD     Sent: 06/16/2019 10:51 AM PST       To: Sherron Monday, MD  Subject: 20-Other    Hi Dr. Cherylann Ratel,    [facility] all is well. I will be transitioning health insurance to [facility] due to a change in employers. I was wondering if you 1) had any recommendations for cardiology providers, and 2) had any recommendations with respect to how to successfully continue coverage for Praluent. I will have my medical records sent to the new cardiology provider when I have established care with them, but wanted to check in with you before I begin the process of identifying the new provider.    You were instrumental in getting me on Praluent, which was something that cardiologists had unsuccessfully attempted for 4 years prior to establishing care with you, so I want to make sure that treatment continues.     I also wanted to extend my appreciation to you  (and your office staff) for working hard to get Praluent approved and overall supporting me in my care. I wish I could continue care with you (I'll look into whether that's possible, but it's an HMO, so you know how that goes.Marland Kitchen). Thank you.     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     Also, I'm happy to chat via phone if that's easier. [phone]    All the best,  [name]

## 2019-06-16 NOTE — Telephone Encounter (Signed)
From: [name], PhD  To: Sherron Monday, MD  Sent: 06/16/2019 10:51 AM PST  Subject: 20-Other    Hi Dr. Cherylann Ratel,    [facility] all is well. I will be transitioning health insurance to [facility] due to a change in employers. I was wondering if you 1) had any recommendations for cardiology providers, and 2) had any recommendations with respect to how to successfully continue coverage for Praluent. I will have my medical records sent to the new cardiology provider when I have established care with them, but wanted to check in with you before I begin the process of identifying the new provider.    You were instrumental in getting me on Praluent, which was something that cardiologists had unsuccessfully attempted for 4 years prior to establishing care with you, so I want to make sure that treatment continues.     I also wanted to extend my appreciation to you (and your office staff) for working hard to get Praluent approved and overall supporting me in my care. I wish I could continue care with you (I'll look into whether that's possible, but it's an HMO, so you know how that goes.Marland Kitchen). Thank you.     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     Also, I'm happy to chat via phone if that's easier. [phone]    All the best,  Erik Burke

## 2019-06-16 NOTE — Telephone Encounter (Signed)
From: Erik Limbo, PhD  To: Erik Monday, MD  Sent: 06/16/2019 6:17 PM PST  Subject: 20-Other    Sounds great, thanks so much!  Erik Burke      ----- Message -----   Erik Apley, MD   Sent:06/16/2019 4:52 PM PST   LN:[id number] Erik Cumins, PhD   Subject:RE: 20-Other    Thank you for your kind message, and I'm sad to see you leave. But there are couple great cardiologists I could recommend at [facility] - Dr. Vivien Burke, and Dr. Ali Burke. I wish transitions were easier between insurances, besides just suggesting to have a full 3 month supply on hand before transition, there is not much else that will help with approval.     I am copying my clinic note below, so you could use that with new insurance, and pass on to the next doctor.    Good luck in your career!    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    He tried Lipitor, pravastatin, Zetia, and Crestor (unsure of Crestor dose).  Muscle weakness and pain with all of those medications, including Zetia alone was to the degree that he could not put clothes on, had to miss work.   He was tested negative for FH mutations.   Mentions that heart rate tends to run high, often in 90's. Had skipped beats couple years ago, none recently.  Below is the detailed summary of his trials of different statins, and prior lipid panel results:      Interval history:  Repatha was approved, and initially tolerated it well, however later started to experience pain in the area of sacrum and coccyx, extending down the hips. We spoke at the time, and although it did not appear to be a typical reaction to Praluent, decided to hold it for  several months. There was no change in symptoms.  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   Has not been exercising due to concern of exacerbating the symptoms, but busy with his 82-monthold son.    Diagnostics (I reviewed all the test results below today):  EKG 09/26/16 (interpreted by me): NSR, HR84    Coronary [place] report:    ADDENDUM REPORT: 12/31/2013 12:36  CLINICAL DATA: Risk stratification  EXAM:  Coronary Calcium Score  TECHNIQUE:  The patient was scanned on a Siemens Sensation 16 slice scanner.  Axial non-contrast 3 mm slices were carried out through the heart.  The data set was analyzed on a dedicated work station and scored  using the AParis  FINDINGS:  Non-cardiac: [facility].  Ascending Aorta: Normal size.  Pericardium: Normal  Coronary arteries: Originating in normal position.  IMPRESSION:  Coronary calcium score of 6. This was 85percentile for age and sex  matched control.    ROS:  Constitutional: negative.   Eyes: negative.   Ears, Nose, Mouth, Throat: negative.   CV: as in HPI.  Resp: negative.   GI: negative.   GU: negative.   Musculoskeletal:negative.  Integumentary: negative.  Neuro: negative.   Psych: negative  Endo: negative.   Heme/Lymphatic: negative.   Allergy/Immun: negative.     Past Medical / Surgical History:  Past Medical History:  Diagnosis Date  . Acne   . Back pain    secondary to herniated disc, from back injury in 2000  . Hyperhidrosis    propanolol tx  . Hyperlipidemia    LDL > 200, TG ~400 at Dx, statin intolerant. Per pt, r/o for familial HDL  . Parasomnia associated with sexual behavior    Tx with Erik Burke, has been being downtitrated    Social History:  Social History    Socioeconomic History  . Marital status: Married    Spouse name: Not on file  . Number of children: Not on file  . Years of education: Not on file  . Highest education level: Not on file  Occupational History  . Not  on file  Social Needs  . Financial resource strain: Not on file  . Food insecurity:    Worry: Not on file    Inability: Not on file  . Transportation needs:    Medical: Not on file    Non-medical: Not on file  Tobacco Use  . Smoking status: Never Smoker  . Smokeless tobacco: Never Used  Substance and Sexual Activity  . Alcohol use: Yes    Comment: 1-3 drinks infrequently  . Drug use: Not on file  . Sexual activity: Not on file  Lifestyle  . Physical activity:    Days per week: Not on file    Minutes per session: Not on file  . Stress: Not on file  Relationships  . Social connections:    Talks on phone: Not on file    Gets together: Not on file    Attends religious service: Not on file    Active member of club or organization: Not on file    Attends meetings of clubs or organizations: Not on file    Relationship status: Not on file  . Intimate partner violence:    Fear of current or ex partner: Not on file    Emotionally abused: Not on file    Physically abused: Not on file    Forced sexual activity: Not on file  Other Topics Concern  . Not on file  Social History Narrative   PhD in Psychology, works in [employer], [place] [employer]    Family History:  Family History  Problem Relation Name Age of Onset  . Cancer Mother      cervical  . No Known Problems Father    . Cancer Maternal Grandmother      ovarian  . No Known Cancer Maternal Grandfather    . Heart Disease Maternal Grandfather    . Cancer Paternal Grandmother      pancreatic  . Cancer Paternal Grandfather      mesothelioma  . Cancer Brother  42    osteosarcoma  Maternal grandfather - HLD, ?heart disease diagnosed in his 29's, family does not know details  Mother - HLD  Father - HLD on statin    Medications (medication list reviewed by me with the patient):  Current Outpatient Medications  Medication Sig  . acetaZOLAMIDE (DIAMOX) 250 MG tablet Take 1 tablet (250 mg) by mouth 3 times daily.  Marland Kitchen alirocumab (PRALUENT) 75 MG/ML SOPN injection pen INJECT 1 ML (75 MG) UNDER  THE SKIN EVERY 14 DAYS.  Marland Kitchen clonazePAM (KLONOPIN) 0.5 MG tablet Take 0.5 tablets (0.25 mg)  by mouth at bedtime.  Marland Kitchen desonide (DESOWEN) 0.05 % cream Apply 1 Application topically 2 times daily. apply to affected areas  . propranolol (INDERAL LA) 60 MG CP24 Take 1 capsule (60 mg) by mouth daily.  Marland Kitchen tretinoin (RETIN-A) 0.025 % cream Use a pea sized amount on the entire face nightly. At first, you may use every other night to avoid dryness and irritation, and gradually increase use to nightly as tolerated    No current facility-administered medications for this visit.   propranolol for hyperhydrosis    Allergies:  Patient is allergic to atorvastatin; pravastatin; rosuvastatin; and zetia [ezetimibe].    Physical Exam:  BP 113/65 (BP Location: Left arm, BP Patient Position: Sitting, BP cuff size: Regular)  Pulse 89  Temp 98 F (36.7 C) (Oral)  Resp 16  Ht 5' 7.99" (1.727 m)  Wt 74.8 kg (165 lb)  SpO2 100%  BMI 25.09 kg/m /   General: awake, alert, no acute distress  HEENT: NCAT, PERRL, EOMI  Neck: JVP ~7cm, no carotid bruits  Lungs: clear to auscultation bilaterally, no wheezing/crackles  Heart: RRR, nl S1S2, no murmurs, rubs or gallops  Abdomen: +BS, soft, non-tender, non-distended  Extremities: warm, well-perfused, no edema, 2+ symmetric distal pulses    Lab Data (reviewed by me today):  Lab Results  Component Value Date   BUN 14 07/15/2017   CREAT 1.04 07/15/2017   CL 108 (H) 02/14/2018   NA 139 02/14/2018   K 3.6 02/14/2018   [place] 9.7 07/15/2017   TBILI 0.54 07/15/2017   ALB 4.6 07/15/2017   TP 7.4 07/15/2017   AST 22 07/15/2017   ALK 64 07/15/2017   BICARB 19 (L) 02/14/2018   ALT 20 07/15/2017   GLU 91 07/15/2017    Lab Results  Component Value Date   WBC 4.4 02/11/2016   RBC 5.04 02/11/2016   HGB 15.5 02/11/2016   HCT 44.9 02/11/2016   MCV 89.1 02/11/2016   MCHC 34.5 02/11/2016   RDW 11.5 (L) 02/11/2016   PLT 245 02/11/2016   MPV 9.3 (L) 02/11/2016    Lab  Results  Component Value Date   CHOL 163 07/15/2017   HDL 54 07/15/2017   LDLCALC 88 07/15/2017   TRIG 105 07/15/2017    Lab Results  Component Value Date   TSH 1.16 02/11/2016      Assessment/Plan:  *hyperlipidemia:   *statin intolerance:  *Coronary calcification (51 percentile for age and sex):  -attempted statin re-challenge, and the patient was only able to tolerate 2.5 of rosuvastatin QOD, unable to tolerate 5 mg QOD due to muscle aches. Inadequate control of LDL with 2.5 mg of rosuvastatin QOD in a young patient with very high [place] score.  Praluent was authorized, and tolerated it well, unlikely pain symptoms are related to PCSK9 inhibitor, therefore, OK to resume.     Of note, in the past the patient already tried Lipitor, pravastatin, Zetia,and all caused debilitating myalgias.   Continue dietary modifications.    Orders Placed This Encounter  Procedures  . Lipid Panel Green Plasma Separator Tube  . Comprehensive Metabolic Panel Green Plasma Separator Tube    There are no Patient Instructions on file for this visit.    Return in about 2 years (around 03/26/2020).      Erik Bills, MD    Assistant Clinical Professor of Medicine  Division of [place]  Pager [phone]  [email or web address]      ----- Message -----  From:Muaz A Dauphine, PhD   Sent:06/16/2019 10:51 AM PST   BF:XOVA Bunnie Philips, MD   Subject:20-Other    Hi Dr. Cherylann Ratel,    [facility] all is well. I will be transitioning health insurance to [facility] due to a change in employers. I was wondering if you 1) had any recommendations for cardiology providers, and 2) had any recommendations with respect to how to successfully continue coverage for Praluent. I will have my medical records sent to the new cardiology provider when I have established care with them, but wanted to check in with you before I begin the process of identifying the new provider.    You were instrumental in getting me on  Praluent, which was something that cardiologists had unsuccessfully attempted for 4 years prior to establishing care with you, so I want to make sure that treatment continues.     I also wanted to extend my appreciation to you (and your office staff) for working hard to get Praluent approved and overall supporting me in my care. I wish I could continue care with you (I'll look into whether that's possible, but it's an HMO, so you know how that goes.Marland Kitchen). Thank you.     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     Also, I'm happy to chat via phone if that's easier. [phone]    All the best,  [name]

## 2019-06-24 ENCOUNTER — Encounter (INDEPENDENT_AMBULATORY_CARE_PROVIDER_SITE_OTHER): Payer: Self-pay | Admitting: Public Health & General Preventive Medicine

## 2019-06-24 ENCOUNTER — Encounter (INDEPENDENT_AMBULATORY_CARE_PROVIDER_SITE_OTHER): Payer: Self-pay | Admitting: Physical Medicine & Rehabilitation

## 2019-06-25 ENCOUNTER — Encounter (INDEPENDENT_AMBULATORY_CARE_PROVIDER_SITE_OTHER): Payer: Self-pay | Admitting: Hospital

## 2019-06-25 ENCOUNTER — Encounter (INDEPENDENT_AMBULATORY_CARE_PROVIDER_SITE_OTHER): Payer: Self-pay | Admitting: Physical Medicine & Rehabilitation

## 2019-06-25 ENCOUNTER — Telehealth (INDEPENDENT_AMBULATORY_CARE_PROVIDER_SITE_OTHER): Admitting: Physical Medicine & Rehabilitation

## 2019-06-26 ENCOUNTER — Encounter (INDEPENDENT_AMBULATORY_CARE_PROVIDER_SITE_OTHER): Payer: Self-pay | Admitting: Public Health & General Preventive Medicine

## 2019-06-26 ENCOUNTER — Encounter (INDEPENDENT_AMBULATORY_CARE_PROVIDER_SITE_OTHER): Payer: Self-pay | Admitting: Physical Medicine & Rehabilitation

## 2019-06-26 DIAGNOSIS — J634 Siderosis: Secondary | ICD-10-CM

## 2019-06-26 DIAGNOSIS — G9611 Dural tear: Secondary | ICD-10-CM

## 2019-06-26 NOTE — Telephone Encounter (Signed)
 From: Asenath Blacker, PhD  To: Metta Actis, MD  Sent: 06/24/2019 9:11 PM PST  Subject: 20-Other    Hi Dr. Nigel Bart,    The saga continues with attempting to get the neurosurgery follow-up appointment with Dr. Rayburn Cagey at [facility] covered/approved by [facility] medical group.     I had another conversation with an Tax adviser at [place] today. She mentioned I should try to get an appointment with any [facility] neurosurgeon (to document the appointment) and then request a second opinion with Dr. Rayburn Cagey at [facility].    This is frustrating because I don't want to use up the neurosurgeons' time but I'm following her rec. Would you be able to write a general neurosurgery referral (to discuss results of recent MRI imaging, and for post-surgery appointment) and I'll schedule with the soonest-available in-network provider, then request the second opinion to [place]?    Thank you,  [name]

## 2019-06-26 NOTE — Telephone Encounter (Signed)
 Hi Bevin,    Consistent with the note I just sent you on MRI of the brain, you need to review this with a neurosurgeon.    Best,  Calvin Caulk

## 2019-06-29 ENCOUNTER — Encounter (INDEPENDENT_AMBULATORY_CARE_PROVIDER_SITE_OTHER): Payer: Self-pay | Admitting: Public Health & General Preventive Medicine

## 2019-06-30 ENCOUNTER — Encounter (INDEPENDENT_AMBULATORY_CARE_PROVIDER_SITE_OTHER): Payer: Self-pay | Admitting: Hospital

## 2019-06-30 ENCOUNTER — Encounter (INDEPENDENT_AMBULATORY_CARE_PROVIDER_SITE_OTHER): Payer: Self-pay | Admitting: Public Health & General Preventive Medicine

## 2019-06-30 ENCOUNTER — Encounter (INDEPENDENT_AMBULATORY_CARE_PROVIDER_SITE_OTHER): Payer: Self-pay | Admitting: Physical Medicine & Rehabilitation

## 2019-06-30 MED ORDER — LIDOCAINE 4 % EX PTCH
1.0000 | MEDICATED_PATCH | CUTANEOUS | 2 refills | Status: AC
Start: 2019-06-30 — End: ?

## 2019-07-01 ENCOUNTER — Telehealth (INDEPENDENT_AMBULATORY_CARE_PROVIDER_SITE_OTHER): Payer: Self-pay | Admitting: Physical Medicine & Rehabilitation

## 2019-07-01 ENCOUNTER — Encounter (INDEPENDENT_AMBULATORY_CARE_PROVIDER_SITE_OTHER): Payer: Self-pay | Admitting: Public Health & General Preventive Medicine

## 2019-07-01 NOTE — Telephone Encounter (Signed)
 From: Asenath Blacker, PhD  To: Metta Actis, MD  Sent: 06/30/2019 12:36 PM PDT  Subject: 20-Other    Dr. Dani Dupont has been approved! [employer] advised me to contact your office/[place] to get the authorization faxed to Dr. Dani Dupont office ASAP, so that's what I'm doing :)    Thanks,  [name]      ----- Message -----   Lauris Port, MD   Sent:06/30/2019 12:38 AM PDT   ZO:[id number] Arvid Latino, PhD   Subject:RE: 20-Other    I think you sent your message requesting Dr. Dani Dupont before you saw my message mentioning Dr. Trudi Fus.    No worries.       ----- Message -----   From:Joden Arvid Latino, PhD   Sent:06/29/2019 8:30 PM PDT   WU:[id number] Margarite Shearer, MD   Subject:20-Other    Got it, I misunderstood the previous MRI imaging message. Sorry about that. Thanks       ----- Message -----   Lauris Port, MD   Sent:06/29/2019 7:58 PM PDT   BJ:[id number] Arvid Latino, PhD   Subject:RE: 20-Other    Dennis Fitting,    I already submitted stat to the neurosurgeon you requested. If I submitted another referral it would be cancelled as duplicate.     Best,  Pixie Brighter, MD      ----- Message -----   From:Decklan Arvid Latino, PhD   Sent:06/29/2019 1:17 PM PDT   OZ:[id number] Margarite Shearer, MD   Subject:20-Other    Hi Dr. Nigel Bart, just saw your message appointment with Dr. Trudi Fus to provide feedback about MRI images. I'm open to any neurosurgeon appointment, including Dr. Trudi Fus. Thanks again,  [name]      ----- Message -----   Lauris Port, MD   Sent:06/26/2019 12:05 PM PST   VH:[id number] Arvid Latino, PhD   Subject:RE: 20-Other    Stat referral submitted to Dr. Dani Dupont      ----- Message -----   From:Joanna Arvid Latino, PhD   Sent:06/26/2019 11:46 AM PST   BM:[id number] Margarite Shearer, MD   Subject:20-Other    Hi Dr. Nigel Bart, thanks for the note about the MRI's.     You can probably view the current notes. Bottom line is that [facility] made a referral error, so I have not been able to get an in-network neurosurgeon appointment, which is what [place] and [facility] has advised me to do asap so that I can  eventually get authorization for the Dr. Rayburn Cagey appointment in a couple weeks.     I looked up my covered neurosurgeon providers and there are two: Drs. Flores and [facility]. I called both offices, and Dr. Dani Dupont' office is being gracious in considering what they can do given the situation and timeline.     Do you mind submitting a stat referral for Dr. Dani Dupont?    Thank you,  [name]

## 2019-07-02 ENCOUNTER — Telehealth (INDEPENDENT_AMBULATORY_CARE_PROVIDER_SITE_OTHER): Payer: Self-pay | Admitting: Physical Medicine & Rehabilitation

## 2019-07-02 ENCOUNTER — Telehealth (INDEPENDENT_AMBULATORY_CARE_PROVIDER_SITE_OTHER): Payer: Self-pay | Admitting: Neurological Surgery

## 2019-07-02 NOTE — Telephone Encounter (Signed)
Incoming fax from [employer] regarding patient request for contiunity of care.  They are asking to fax last office visit note as soon as possible in order to review the patients COC request.  Please advise [facility] phone # [phone] fax [phone]. This fax was forwarded to [employer]. Thank You!

## 2019-07-03 NOTE — Telephone Encounter (Signed)
Faxed notes from Office visits and procedures with Dr. Leodis Liverpool to [phone].     If they would like records from other [facility] providers, they will need to contact Medical Records.

## 2019-07-06 ENCOUNTER — Encounter (INDEPENDENT_AMBULATORY_CARE_PROVIDER_SITE_OTHER): Payer: Self-pay | Admitting: Hospital

## 2019-07-19 ENCOUNTER — Encounter (INDEPENDENT_AMBULATORY_CARE_PROVIDER_SITE_OTHER): Payer: Self-pay

## 2019-08-07 NOTE — Telephone Encounter (Signed)
 Hi Nate,    Just received this. I think we reviewed already but wanted to make sure.    Let me know.    Thanks!  Metta Actis, MD

## 2019-08-08 ENCOUNTER — Encounter (INDEPENDENT_AMBULATORY_CARE_PROVIDER_SITE_OTHER): Payer: Self-pay | Admitting: Public Health & General Preventive Medicine

## 2019-08-08 NOTE — Telephone Encounter (Signed)
 From: Asenath Blacker, PhD  To: Metta Actis, MD  Sent: 08/08/2019 11:13 AM PDT  Subject: 1-Non Urgent Medical Advice    Thanks Dr. Nigel Bart for sending along the MRI interpretive report. I'm transitioning care to [facility] now given the change in insurance. I don't anticipate needing to reach out to you again, although I'm still waiting to establish care with a few folks. Just wanted to say thank you so much for working with me during that somewhat cumbersome and unfortunate transition between [facility] and [facility] after [place] abruptly terminated the contract with [facility]. You were the initial reason my continuity of care happened. To be frank, I attempted to establish care with a number of other PCP's within [facility] before you, and for a variety of both scheduling and individual provider personal choice reasons, that unfortunately did not happen. But you were open and willing. And it worked out. Plain and simple. Thank you. All the best,  [employer]

## 2019-09-05 ENCOUNTER — Encounter (INDEPENDENT_AMBULATORY_CARE_PROVIDER_SITE_OTHER): Payer: Self-pay | Admitting: Internal Medicine

## 2019-09-05 ENCOUNTER — Encounter (HOSPITAL_BASED_OUTPATIENT_CLINIC_OR_DEPARTMENT_OTHER): Payer: Self-pay | Admitting: Pain Medicine

## 2019-09-07 NOTE — Telephone Encounter (Signed)
From: [name], PhD  To: [name], MD  Sent: 09/05/2019 1:06 PM PDT  Subject: 5-General Compliment    Hi Dr. Selinda Eon. I recently established care with new longer term providers at a different medical facility given a change in insurance. Just wanted to extend gratitude to you (and Caryl Pina!) for all that you did as my PCP from 2017-2020, a challenging time with treatment, referrals etc. given my dural tear/CSF leaks. Keep up the good work, Social research officer, government

## 2019-10-24 ENCOUNTER — Encounter (INDEPENDENT_AMBULATORY_CARE_PROVIDER_SITE_OTHER): Payer: Self-pay | Admitting: Physical Medicine & Rehabilitation

## 2019-10-24 ENCOUNTER — Encounter (HOSPITAL_BASED_OUTPATIENT_CLINIC_OR_DEPARTMENT_OTHER): Payer: Self-pay | Admitting: Pain Medicine

## 2019-11-02 ENCOUNTER — Encounter (HOSPITAL_BASED_OUTPATIENT_CLINIC_OR_DEPARTMENT_OTHER): Payer: Self-pay | Admitting: Pain Medicine

## 2019-11-25 ENCOUNTER — Ambulatory Visit (HOSPITAL_BASED_OUTPATIENT_CLINIC_OR_DEPARTMENT_OTHER): Payer: No Typology Code available for payment source | Admitting: Pain Medicine

## 2020-12-13 ENCOUNTER — Telehealth (HOSPITAL_BASED_OUTPATIENT_CLINIC_OR_DEPARTMENT_OTHER): Payer: Self-pay | Admitting: Neurological Surgery

## 2020-12-13 NOTE — Telephone Encounter (Signed)
Incoming fax received from [place] addressed to Dr. Acquanetta Chain  Regarding notice of modification of medical services because of lack of medical necessity. The fax was scanned into patients chart and message is being forwarded to [place].

## 2020-12-14 NOTE — Telephone Encounter (Signed)
Noted. Not approved for 2nd opinion with Dr. Devin Going per insurance

## 2022-07-26 ENCOUNTER — Encounter (INDEPENDENT_AMBULATORY_CARE_PROVIDER_SITE_OTHER): Payer: Self-pay | Admitting: Hospital
